# Patient Record
Sex: Male | Born: 1955
Health system: Southern US, Community
[De-identification: ages and names within clinical notes are randomized; demographics above are authoritative.]

## PROBLEM LIST (undated history)

## (undated) DIAGNOSIS — E785 Hyperlipidemia, unspecified: Secondary | ICD-10-CM

## (undated) DIAGNOSIS — M109 Gout, unspecified: Secondary | ICD-10-CM

## (undated) DIAGNOSIS — K5792 Diverticulitis of intestine, part unspecified, without perforation or abscess without bleeding: Secondary | ICD-10-CM

## (undated) DIAGNOSIS — J40 Bronchitis, not specified as acute or chronic: Secondary | ICD-10-CM

## (undated) DIAGNOSIS — T7840XA Allergy, unspecified, initial encounter: Secondary | ICD-10-CM

## (undated) DIAGNOSIS — M1990 Unspecified osteoarthritis, unspecified site: Secondary | ICD-10-CM

## (undated) DIAGNOSIS — I10 Essential (primary) hypertension: Secondary | ICD-10-CM

## (undated) DIAGNOSIS — K5790 Diverticulosis of intestine, part unspecified, without perforation or abscess without bleeding: Secondary | ICD-10-CM

## (undated) DIAGNOSIS — K635 Polyp of colon: Secondary | ICD-10-CM

## (undated) DIAGNOSIS — K529 Noninfective gastroenteritis and colitis, unspecified: Secondary | ICD-10-CM

## (undated) HISTORY — DX: Hyperlipidemia, unspecified: E78.5

## (undated) HISTORY — PX: SPINE SURGERY: SHX786

## (undated) HISTORY — PX: COLONOSCOPY: SHX174

## (undated) HISTORY — DX: Allergy, unspecified, initial encounter: T78.40XA

## (undated) HISTORY — PX: JOINT REPLACEMENT: SHX530

## (undated) HISTORY — DX: Polyp of colon: K63.5

## (undated) HISTORY — DX: Diverticulosis of intestine, part unspecified, without perforation or abscess without bleeding: K57.90

## (undated) HISTORY — PX: COLONOSCOPY W/ POLYPECTOMY: SHX1380

---

## 2005-05-09 ENCOUNTER — Ambulatory Visit: Payer: Self-pay | Admitting: Internal Medicine

## 2005-12-20 ENCOUNTER — Ambulatory Visit: Payer: Self-pay | Admitting: Internal Medicine

## 2006-01-02 ENCOUNTER — Ambulatory Visit: Payer: Self-pay | Admitting: Internal Medicine

## 2006-01-10 ENCOUNTER — Ambulatory Visit: Payer: Self-pay | Admitting: Internal Medicine

## 2007-04-29 ENCOUNTER — Ambulatory Visit: Payer: Self-pay | Admitting: Internal Medicine

## 2008-04-05 ENCOUNTER — Ambulatory Visit: Payer: Self-pay | Admitting: Internal Medicine

## 2009-03-23 ENCOUNTER — Ambulatory Visit: Payer: Self-pay | Admitting: Internal Medicine

## 2009-12-14 ENCOUNTER — Ambulatory Visit: Payer: Self-pay | Admitting: Family Medicine

## 2009-12-14 DIAGNOSIS — I10 Essential (primary) hypertension: Secondary | ICD-10-CM

## 2009-12-18 ENCOUNTER — Telehealth: Payer: Self-pay | Admitting: Internal Medicine

## 2010-01-09 ENCOUNTER — Ambulatory Visit: Payer: Self-pay | Admitting: Internal Medicine

## 2010-01-09 DIAGNOSIS — G4700 Insomnia, unspecified: Secondary | ICD-10-CM | POA: Insufficient documentation

## 2010-01-16 ENCOUNTER — Encounter (INDEPENDENT_AMBULATORY_CARE_PROVIDER_SITE_OTHER): Payer: Self-pay | Admitting: *Deleted

## 2010-03-16 ENCOUNTER — Emergency Department (HOSPITAL_COMMUNITY): Admission: EM | Admit: 2010-03-16 | Discharge: 2010-03-16 | Payer: Self-pay | Admitting: Emergency Medicine

## 2010-03-26 ENCOUNTER — Ambulatory Visit: Payer: Self-pay | Admitting: Internal Medicine

## 2010-03-26 LAB — CONVERTED CEMR LAB
ALT: 33 units/L (ref 0–53)
AST: 28 units/L (ref 0–37)
Alkaline Phosphatase: 50 units/L (ref 39–117)
BUN: 14 mg/dL (ref 6–23)
Bilirubin, Direct: 0.1 mg/dL (ref 0.0–0.3)
Calcium: 9.1 mg/dL (ref 8.4–10.5)
Cholesterol: 153 mg/dL (ref 0–200)
Creatinine, Ser: 1.1 mg/dL (ref 0.4–1.5)
Eosinophils Relative: 2.2 % (ref 0.0–5.0)
GFR calc non Af Amer: 75.11 mL/min (ref >60)
LDL Cholesterol: 93 mg/dL (ref 0–99)
Lymphocytes Relative: 33.2 % (ref 12.0–46.0)
Monocytes Absolute: 0.9 10*3/uL (ref 0.1–1.0)
Monocytes Relative: 9.7 % (ref 3.0–12.0)
Neutrophils Relative %: 54.3 % (ref 43.0–77.0)
Nitrite: NEGATIVE
PSA: 0.53 ng/mL (ref 0.10–4.00)
Platelets: 292 10*3/uL (ref 150.0–400.0)
Protein, U semiquant: NEGATIVE
RBC: 4.51 M/uL (ref 4.22–5.81)
Total Bilirubin: 0.4 mg/dL (ref 0.3–1.2)
Total CHOL/HDL Ratio: 4
Triglycerides: 108 mg/dL (ref 0.0–149.0)
Urobilinogen, UA: 0.2
VLDL: 21.6 mg/dL (ref 0.0–40.0)
WBC: 8.9 10*3/uL (ref 4.5–10.5)

## 2010-04-02 ENCOUNTER — Ambulatory Visit: Payer: Self-pay | Admitting: Internal Medicine

## 2010-04-02 ENCOUNTER — Encounter: Payer: Self-pay | Admitting: Internal Medicine

## 2010-04-03 ENCOUNTER — Encounter: Payer: Self-pay | Admitting: Internal Medicine

## 2010-04-19 ENCOUNTER — Telehealth: Payer: Self-pay | Admitting: Internal Medicine

## 2010-05-03 ENCOUNTER — Ambulatory Visit: Payer: Self-pay | Admitting: Internal Medicine

## 2010-05-03 ENCOUNTER — Encounter (INDEPENDENT_AMBULATORY_CARE_PROVIDER_SITE_OTHER): Payer: Self-pay | Admitting: *Deleted

## 2010-05-08 ENCOUNTER — Ambulatory Visit: Payer: Self-pay | Admitting: Internal Medicine

## 2010-05-11 ENCOUNTER — Ambulatory Visit: Payer: Self-pay | Admitting: Internal Medicine

## 2010-05-11 DIAGNOSIS — M109 Gout, unspecified: Secondary | ICD-10-CM | POA: Insufficient documentation

## 2010-05-22 ENCOUNTER — Ambulatory Visit: Payer: Self-pay | Admitting: Internal Medicine

## 2010-05-27 ENCOUNTER — Encounter: Payer: Self-pay | Admitting: Internal Medicine

## 2010-08-01 NOTE — Letter (Signed)
Summary: Patient Notice- Polyp Results  [facility]  [address] [place], [place] [address]   Phone: [phone]  Fax: [phone]        May 27, 2010 MRN: [id number]    KAIDIN BOEHLE [address] [employer] [employer], [place]  [address]    Dear Mr. Wilds,  I am pleased to inform you that the colon polyp(s) removed during your recent colonoscopy was (were) found to be benign (no cancer detected) upon pathologic examination.  I recommend you have a repeat colonoscopy examination in 5 years to look for recurrent polyps, as having colon polyps increases your risk for having recurrent polyps or even colon cancer in the future.  Should you develop new or worsening symptoms of abdominal pain, bowel habit changes or bleeding from the rectum or bowels, please schedule an evaluation with either your primary care physician or with me.  Additional information/recommendations:  __ No further action with gastroenterology is needed at this time. Please      follow-up with your primary care physician for your other healthcare      needs.    Please call us if you are having persistent problems or have questions about your condition that have not been fully answered at this time.  Sincerely,  Hilarie Fredrickson MD  This letter has been electronically signed by your physician.  Appended Document: Patient Notice- Polyp Results Letter mailed

## 2010-08-01 NOTE — Letter (Signed)
Summary: Referral - not able to see patient  [facility]  [address] [place], [place] [address]   Phone: [phone]  Fax: [phone]    January 16, 2010   Birdie Sons, M.D. [address] [place], [place] [address]   Re:   [name] DOB:  [birth year] MRN:   [id number]    Dear Dr. Cato Mulligan:  Thank you for your kind referral of the above patient.  We have attempted to schedule the recommended procedure Screening Colonoscopy but have not been able to schedule because:  ___ The patient was not available by phone and/or has not returned our calls.   X  The patient declined to schedule the procedure at this time.  We appreciate the referral and hope that we will have the opportunity to treat this patient in the future.    Sincerely,    [facility] [phone]

## 2010-08-01 NOTE — Letter (Signed)
Summary: Moviprep Instructions  [facility]  [address]   [place], [place] [address]   Phone: [phone]  Fax: [phone]       Donald [name]    [birth year]    MRN: [id number]        Procedure Day Dorna Bloom: Tuesday, 05-22-10     Arrival Time: 10:00 a.m.     Procedure Time: 11:00 a.m.     Location of Procedure:                    x    [facility]oscopy Center (4th Floor)                        PREPARATION FOR COLONOSCOPY WITH MOVIPREP   Starting 5 days prior to your procedure 05-17-10 do not eat nuts, seeds, popcorn, corn, beans, peas,  salads, or any raw vegetables.  Do not take any fiber supplements (e.g. Metamucil, Citrucel, and Benefiber).  THE DAY BEFORE YOUR PROCEDURE         DATE: 05-21-10   DAY: Monday  1.  Drink clear liquids the entire day-NO SOLID FOOD  2.  Do not drink anything colored red or purple.  Avoid juices with pulp.  No orange juice.  3.  Drink at least 64 oz. (8 glasses) of fluid/clear liquids during the day to prevent dehydration and help the prep work efficiently.  CLEAR LIQUIDS INCLUDE: Water Jello Ice Popsicles Tea (sugar ok, no milk/cream) Powdered fruit flavored drinks Coffee (sugar ok, no milk/cream) Gatorade Juice: apple, white grape, white cranberry  Lemonade Clear bullion, consomm, broth Carbonated beverages (any kind) Strained chicken noodle soup Hard Candy                             4.  In the morning, mix first dose of MoviPrep solution:    Empty 1 Pouch A and 1 Pouch B into the disposable container    Add lukewarm drinking water to the top line of the container. Mix to dissolve    Refrigerate (mixed solution should be used within 24 hrs)  5.  Begin drinking the prep at 5:00 p.m. The MoviPrep container is divided by 4 marks.   Every 15 minutes drink the solution down to the next mark (approximately 8 oz) until the full liter is complete.   6.  Follow completed prep with 16 oz of clear liquid of your choice  (Nothing red or purple).  Continue to drink clear liquids until bedtime.  7.  Before going to bed, mix second dose of MoviPrep solution:    Empty 1 Pouch A and 1 Pouch B into the disposable container    Add lukewarm drinking water to the top line of the container. Mix to dissolve    Refrigerate  THE DAY OF YOUR PROCEDURE      DATE: 05-22-10 DAY: Tuesday  Beginning at 6:00 a.m. (5 hours before procedure):         1. Every 15 minutes, drink the solution down to the next mark (approx 8 oz) until the full liter is complete.  2. Follow completed prep with 16 oz. of clear liquid of your choice.    3. You may drink clear liquids until  9:00 a.m.  (2 HOURS BEFORE PROCEDURE).   MEDICATION INSTRUCTIONS  Unless otherwise instructed, you should take regular prescription medications with a small sip of water   as early as possible  the morning of your procedure.   Additional medication instructions: Do not take Losartan/HCTZ day of procedure.         OTHER INSTRUCTIONS  You will need a responsible adult at least 54 years of age to accompany you and drive you home.   This person must remain in the waiting room during your procedure.  Wear loose fitting clothing that is easily removed.  Leave jewelry and other valuables at home.  However, you may wish to bring a book to read or  an iPod/MP3 player to listen to music as you wait for your procedure to start.  Remove all body piercing jewelry and leave at home.  Total time from sign-in until discharge is approximately 2-3 hours.  You should go home directly after your procedure and rest.  You can resume normal activities the  day after your procedure.  The day of your procedure you should not:   Drive   Make legal decisions   Operate machinery   Drink alcohol   Return to work  You will receive specific instructions about eating, activities and medications before you leave.    The above instructions have been reviewed  and explained to me by   Ezra Sites RN  May 08, 2010 9:58 AM    I fully understand and can verbalize these instructions _____________________________ Date _________

## 2010-08-01 NOTE — Assessment & Plan Note (Signed)
Summary: ? bronchitis//ccm   Vital Signs:  Patient profile:   55 year old male Temp:     97.8 degrees F oral BP sitting:   180 / 110  (left arm) Cuff size:   large  Vitals Entered By: Sid Falcon LPN (December 14, 2009 8:38 AM)  Serial Vital Signs/Assessments:  Time      Position  BP       Pulse  Resp  Temp     By                     186/118                        Evelena Peat MD  CC: Bronchitis symptoms, Hypertension Management   History of Present Illness: Patient is seen today for the following:  Two-week history of upper respiratory illness. Started with sore throat. Initially thought this was allergy related. Subsequent development of cough with thick white sputum. Denies dyspnea, pleuritic pain, or hemoptysis. No fever. Patient is nonsmoker. Tried NyQuil and Robitussin without relief. Cough is especially severe at night.  History of elevated blood pressure but never treated. Denies any headaches, dizziness, chest pains, or any edema issues. No regular nonsteroidal use. Has not had complete physical in several years  Hypertension History:      He denies headache, chest pain, palpitations, dyspnea with exertion, orthopnea, PND, peripheral edema, visual symptoms, neurologic problems, syncope, and side effects from treatment.        Positive major cardiovascular risk factors include male age 75 years old or older and hypertension.  Negative major cardiovascular risk factors include non-tobacco-user status.     Allergies (verified): No Known Drug Allergies  Past History:  Social History: Last updated: 12/14/2009 Never Smoked Alcohol use-yes  Past Medical History: elevated blood pressure  Social History: Never Smoked Alcohol use-yes  Review of Systems  The patient denies vision loss, chest pain, syncope, dyspnea on exertion, peripheral edema, prolonged cough, headaches, hemoptysis, abdominal pain, melena, hematochezia, and severe indigestion/heartburn.     Physical Exam  General:  Well-developed,well-nourished,in no acute distress; alert,appropriate and cooperative throughout examination Eyes:  No corneal or conjunctival inflammation noted. EOMI. Perrla. Funduscopic exam benign, without hemorrhages, exudates or papilledema. Vision grossly normal. Ears:  External ear exam shows no significant lesions or deformities.  Otoscopic examination reveals clear canals, tympanic membranes are intact bilaterally without bulging, retraction, inflammation or discharge. Hearing is grossly normal bilaterally. Mouth:  Oral mucosa and oropharynx without lesions or exudates.  Teeth in good repair. Neck:  No deformities, masses, or tenderness noted. Lungs:  Normal respiratory effort, chest expands symmetrically. Lungs are clear to auscultation, no crackles or wheezes. Heart:  Normal rate and regular rhythm. S1 and S2 normal without gallop, murmur, click, rub or other extra sounds. Extremities:  no pitting edema noted   Impression & Recommendations:  Problem # 1:  ACUTE BRONCHITIS (ICD-466.0) Assessment New suspect viral. Prescribed cough medication for symptom relief His updated medication list for this problem includes:    Vicks Nyquil Multi-symptom 15-6.25-500 Mg/41ml Liqd (Dm-doxylamine-acetaminophen) .Marland Kitchen... Prn    Hydrocodone-homatropine 5-1.5 Mg/91ml Syrp (Hydrocodone-homatropine) ..... One tsp by mouth q 4-6 hours as needed cough  Problem # 2:  ESSENTIAL HYPERTENSION (ICD-401.9) Assessment: Deteriorated  severe elevation today. Start medication and follow up with primary physician in 2 weeks.  Reduce ETOH to no more than 2 drinks/day.  His updated medication list for this problem includes:  Losartan Potassium-hctz 50-12.5 Mg Tabs (Losartan potassium-hctz) ..... One by mouth once daily  Complete Medication List: 1)  Vicks Nyquil Multi-symptom 15-6.25-500 Mg/22ml Liqd (Dm-doxylamine-acetaminophen) .... Prn 2)  Aspirin 325 Mg Tabs (Aspirin) ....  Prn 3)  Epipen 0.3 Mg/0.11ml Devi (Epinephrine) .... Use as directed for severe allergic reaction 4)  Hydrocodone-homatropine 5-1.5 Mg/39ml Syrp (Hydrocodone-homatropine) .... One tsp by mouth q 4-6 hours as needed cough 5)  Losartan Potassium-hctz 50-12.5 Mg Tabs (Losartan potassium-hctz) .... One by mouth once daily  Hypertension Assessment/Plan:      The patient's hypertensive risk group is category B: At least one risk factor (excluding diabetes) with no target organ damage.  Today's blood pressure is 180/110.    Patient Instructions: 1)  Please schedule a follow-up appointment in 2 weeks with Dr Cato Mulligan. 2)  You need to lose weight. Consider a lower calorie diet and regular exercise.  3)  It is not healthy for men to drink more then 2-3 drinks per day or for women to drink more then 1-2 drinks per day.  4)  Acute Bronchitis symptoms for less then 10 days are not  helped by antibiotics. Take over the counter cough medications. Call if no improvement in 5-7 days, sooner if increasing cough, fever, or new symptoms ( shortness of breath, chest pain) .  Prescriptions: LOSARTAN POTASSIUM-HCTZ 50-12.5 MG TABS (LOSARTAN POTASSIUM-HCTZ) one by mouth once daily  #30 x 3   Entered and Authorized by:   Evelena Peat MD   Signed by:   Evelena Peat MD on 12/14/2009   Method used:   Electronically to        [id number] (ret[facility]       [address]       [place], [place]  [address]       Ph: [phone] or [phone]       Fax: [phone]   RxID:   [id number] HYDROCODONE-HOMATROPINE 5-1.5 MG/5ML SYRP (HYDROCODONE-HOMATROPINE) one tsp by mouth q 4-6 hours as needed cough  #120 ml x 0   Entered and Authorized by:   Evelena Peat MD   Signed by:   Evelena Peat MD on 12/14/2009   Method used:   Print then Give to Patient   RxID:   [id number]

## 2010-08-01 NOTE — Progress Notes (Signed)
  Phone Note Call from Patient   Caller: Patient Call For: Birdie Sons MD Summary of Call: Left message for pt to reschedule his mole excision. Initial call taken by: [facility] CMA AAMA,  April 19, 2010 1:14 PM

## 2010-08-01 NOTE — Assessment & Plan Note (Signed)
Summary: ?strep throat/cjr   Vital Signs:  Patient profile:   55 year old male Temp:     98.9 degrees F oral BP sitting:   142 / 94  (left arm) Cuff size:   large  Vitals Entered By: Alfred Levins, CMA (May 03, 2010 8:06 AM)  Contraindications/Deferment of Procedures/Staging:    Test/Procedure: Weight Refused    Reason for deferment: patient declined-cannot calculate BMI  CC: st x3 days   CC:  st x3 days.  History of Present Illness: patient comes in complaining of sore throat for 4 days. Denies any fever chills. In addition he notes developing an abscess on his right deltoid area coincidently in the same place 30th tetanus shot 4 weeks ago.  Patient describes throat pain as moderate to severe. He has a history of strep throat.  Review of systems patient denies any chest pain or shortness breath, PND. Denies any fever, chills, rashes. No other complaints.  Allergies (verified): No Known Drug Allergies  Past History:  Past Medical History: Last updated: 12/14/2009 elevated blood pressure  Family History: Last updated: Apr 04, 2010 father deceased lung CA age 50 mother deceased COPD 6  Social History: Last updated: 2010/04/04 Never Smoked Alcohol use-yes [employer] no kids  Risk Factors: Smoking Status: never (03/23/2009)  Physical Exam  General:  overweight male in no acute distress. HEENT exam atraumatic, normocephalic symmetric her muscles are intact. Oropharyngeal is moist. Posterior pharynx is erythematous with minimal exudate. Neck is supple without lymphadenopathy. Chest heart auscultation cardiac exam S1-S2 are regular. Neurologic exam of the right deltoid area patient has a 2 x 3 cm fluctuant area.   Impression & Recommendations:  Problem # 1:  CELLULITIS/ABSCESS, ARM (ICD-682.3)  patient has developed a cellulitis and abscess on his right arm. Unclear whether related to tetanus shot. Timing doesn't seem consistent. After risks and  benefits were explained the patient he agreed to I&D the area. There was prepped and draped in a sterile fashion. Access was entered with a 20-gauge needle. Fairly large incision was made this with a needle. Several cc of fluctuant material was expressed. Patient was instructed to keep the area clean. He'll use warm compress. His updated medication list for this problem includes:    Doxycycline Hyclate 100 Mg Caps (Doxycycline hyclate) .Marland Kitchen... Take 1 tab twice a day  side effects discussed.  Orders: I&D Abscess, Simple / Single (10060)  Problem # 2:  SORE THROAT (ICD-462) patient has an erythematous throat. Strep screen was negative. It turns out that he needs an antibiotic for his arm and this should cover any possibility of strep throat. His updated medication list for this problem includes:    Aspirin 325 Mg Tabs (Aspirin) .Marland Kitchen... Prn    Doxycycline Hyclate 100 Mg Caps (Doxycycline hyclate) .Marland Kitchen... Take 1 tab twice a day  Orders: Rapid Strep ([address])  Complete Medication List: 1)  Aspirin 325 Mg Tabs (Aspirin) .... Prn 2)  Epipen 0.3 Mg/0.63ml Devi (Epinephrine) .... Use as directed for severe allergic reaction 3)  Losartan Potassium-hctz 50-12.5 Mg Tabs (Losartan potassium-hctz) .... One by mouth once daily 4)  Trazodone Hcl 50 Mg Tabs (Trazodone hcl) .... 1/2-1 by mouth at bedtime as needed insomnia 5)  Doxycycline Hyclate 100 Mg Caps (Doxycycline hyclate) .... Take 1 tab twice a day Prescriptions: EPIPEN 0.3 MG/0.3ML DEVI (EPINEPHRINE) use as directed for severe allergic reaction  #2 x 1   Entered and Authorized by:   Birdie Sons MD   Signed by:  Birdie Sons MD on 05/03/2010   Method used:   Electronically to        [id number]       [address]       [place], [place]  [address]       Ph: [phone] or [phone]       Fax: [phone]   RxID:   [id number] DOXYCYCLINE HYCLATE 100 MG CAPS (DOXYCYCLINE HYCLATE) Take 1 tab twice a day  #20 x 0   Entered and  Authorized by:   Birdie Sons MD   Signed by:   Birdie Sons MD on 05/03/2010   Method used:   Electronically to        [id number]       [address]       [place], [place]  [address]       Ph: [phone] or [phone]       Fax: [phone]   RxID:   [id number]    Orders Added: 1)  Rapid Strep [254[address]] 2)  Est. Patient Level III [[address]] 3)  I&D Abscess, Simple / Single [10060]     Laboratory Results  Date/Time Received: May 03, 2010 8:23 AM Date/Time Reported: May 03, 2010 8:23 AM  Other Tests  Rapid HIV: negative Comments: Alfred Levins, CMA  May 03, 2010 8:23 AM

## 2010-08-01 NOTE — Letter (Signed)
Summary: Pre Visit Letter Revised  [facility]  [address] [place], [place] [address]   Phone: [phone]  Fax: [phone]        04/03/2010 MRN: [id number]  Donald [name] [address] [place], [place]  [address]             Procedure Date:  11-22 at 11am  Welcome to the Gastroenterology Division at [facility].    You are scheduled to see a nurse for your pre-procedure visit on 05-08-10 at 10am  on the 3rd floor at [facility], [address]  We ask that you try to arrive at our office 15 minutes prior to your appointment time to allow for check-in.  Please take a minute to review the attached form.  If you answer "Yes" to one or more of the questions on the first page, we ask that you call the person listed at your earliest opportunity.  If you answer "No" to all of the questions, please complete the rest of the form and bring it to your appointment.    Your nurse visit will consist of discussing your medical and surgical history, your immediate family medical history, and your medications.   If you are unable to list all of your medications on the form, please bring the medication bottles to your appointment and we will list them.  We will need to be aware of both prescribed and over the counter drugs.  We will need to know exact dosage information as well.    Please be prepared to read and sign documents such as consent forms, a financial agreement, and acknowledgement forms.  If necessary, and with your consent, a friend or relative is welcome to sit-in on the nurse visit with you.  Please bring your insurance card so that we may make a copy of it.  If your insurance requires a referral to see a specialist, please bring your referral form from your primary care physician.  No co-pay is required for this nurse visit.     If you cannot keep your appointment, please call [phone] to cancel or reschedule prior to your appointment date.  This  allows [place] the opportunity to schedule an appointment for another patient in need of care.    Thank you for choosing [facility] for your medical needs.  We appreciate the opportunity to care for you.  Please visit [place] at our website  to learn more about our practice.  Sincerely, The Gastroenterology Division

## 2010-08-01 NOTE — Miscellaneous (Signed)
Summary: [facility]  Clinical Lists Changes  Medications: Added new medication of MOVIPREP 100 GM  SOLR (PEG-KCL-NACL-NASULF-NA ASC-C) As per prep instructions. - Signed Rx of MOVIPREP 100 GM  SOLR (PEG-KCL-NACL-NASULF-NA ASC-C) As per prep instructions.;  #1 x 0;  Signed;  Entered by: Ezra Sites RN;  Authorized by: Hilarie Fredrickson MD;  Method used: Electronically [id number] [address] [place], [place]  [address], Ph: [phone] or [phone], Fax: [phone] Observations: Added new observation of NKA: T (05/08/2010 9:32)    Prescriptions: MOVIPREP 100 GM  SOLR (PEG-KCL-NACL-NASULF-NA ASC-C) As per prep instructions.  #1 x 0   Entered by:   Ezra Sites RN   Authorized by:   Hilarie Fredrickson MD   Signed by:   Ezra Sites RN on 05/08/2010   Method used:   Electronically to        [id number] ([facility]       [address]       [place], [place]  [address]       Ph: [phone] or [phone]       Fax: [phone]   RxID:   [id number]

## 2010-08-01 NOTE — Progress Notes (Signed)
Summary: continues cough  Phone Note Refill Request Call back at [phone] Message from:  Patient on December 18, 2009 8:23 AM  Refills Requested: Medication #1:  HYDROCODONE-HOMATROPINE 5-1.5 MG/5ML SYRP one tsp by mouth q 4-6 hours as needed cough   Notes: [facility].    Initial call taken by: Debbra Riding,  December 18, 2009 8:23 AM  Follow-up for Phone Call        was just prescribed on 12/14/09.  do you want to refill already?   Called pt and he states will be out today.  States cough is some better but not resolved. Follow-up by: Gladis Riffle, RN,  December 18, 2009 12:07 PM  Additional Follow-up for Phone Call Additional follow up Details #1::        ok to refill Additional Follow-up by: Birdie Sons MD,  December 18, 2009 4:44 PM    Additional Follow-up for Phone Call Additional follow up Details #2::    see Rx.Patient notified.  Follow-up by: Gladis Riffle, RN,  December 19, 2009 7:36 AM  Prescriptions: Romana Juniper 5-1.5 MG/5ML SYRP (HYDROCODONE-HOMATROPINE) one tsp by mouth q 4-6 hours as needed cough  #120 ml x 0   Entered by:   Gladis Riffle, RN   Authorized by:   Birdie Sons MD   Signed by:   Gladis Riffle, RN on 12/19/2009   Method used:   Telephoned to ...       [id number]       [address]       [place], [place]  [address]       Ph: [phone] or [phone]       Fax: [phone]   RxID:   [id number]

## 2010-08-01 NOTE — Assessment & Plan Note (Signed)
Summary: CPX/NJR   Vital Signs:  Patient profile:   55 year old male Height:      74.50 inches Weight:      335 pounds BMI:     42.59 Temp:     98.7 degrees F oral Pulse rate:   80 / minute Pulse rhythm:   regular Resp:     12 per minute BP sitting:   130 / 92  (left arm) Cuff size:   large  Vitals Entered By: Sid Falcon LPN (April 02, 2010 9:12 AM)  History of Present Illness: CPX   Current Problems (verified): 1)  Preventive Health Care  (ICD-V70.0) 2)  Insomnia-sleep Disorder-unspec  (ICD-780.52) 3)  Essential Hypertension  (ICD-401.9) 4)  Uri  (ICD-465.9)  Current Medications (verified): 1)  Aspirin 325 Mg Tabs (Aspirin) .... Prn 2)  Epipen 0.3 Mg/0.41ml Devi (Epinephrine) .... Use As Directed For Severe Allergic Reaction 3)  Losartan Potassium-Hctz 50-12.5 Mg Tabs (Losartan Potassium-Hctz) .... One By Mouth Once Daily 4)  Trazodone Hcl 50 Mg Tabs (Trazodone Hcl) .... 1/2-1 By Mouth At Bedtime As Needed Insomnia  Allergies (verified): No Known Drug Allergies  Family History: father deceased lung CA age 16 mother deceased COPD 16  Social History: Never Smoked Alcohol use-yes [employer] no kids  Physical Exam  General:  alert and well-developed.   Head:  normocephalic and atraumatic.   Eyes:  pupils equal and pupils round.   Ears:  R ear normal and L ear normal.   Nose:  no external deformity and no external erythema.   Mouth:  no gingival abnormalities and no dental plaque.   Neck:  No deformities, masses, or tenderness noted. Chest Wall:  No deformities, masses, tenderness or gynecomastia noted. Lungs:  normal respiratory effort and no intercostal retractions.   Heart:  normal rate and regular rhythm.   Abdomen:  soft and non-tender.   Msk:  No deformity or scoliosis noted of thoracic or lumbar spine.   Neurologic:  cranial nerves II-XII intact and gait normal.   Skin:  turgor normal and color normal.   nearly 1cm lesioin right upper  chest wall---dark Cervical Nodes:  no anterior cervical adenopathy and no posterior cervical adenopathy.   Psych:  good eye contact and not anxious appearing.     Impression & Recommendations:  Problem # 1:  PREVENTIVE HEALTH CARE (ICD-V70.0)  health maint UTD advised aggressive weight loss daily exercise  immunizations updated  Orders: Gastroenterology Referral (GI)  Problem # 2:  ESSENTIAL HYPERTENSION (ICD-401.9) reasonable control given weight BP today: 130/92 Prior BP: 128/92 (01/09/2010)  Prior 10 Yr Risk Heart Disease: Not enough information (12/14/2009)  Labs Reviewed: K+: 4.6 (03/26/2010) Creat: : 1.1 (03/26/2010)   Chol: 153 (03/26/2010)   HDL: 38.40 (03/26/2010)   LDL: 93 (03/26/2010)   TG: 108.0 (03/26/2010) schedule mole excision  Complete Medication List: 1)  Aspirin 325 Mg Tabs (Aspirin) .... Prn 2)  Epipen 0.3 Mg/0.24ml Devi (Epinephrine) .... Use as directed for severe allergic reaction 3)  Losartan Potassium-hctz 50-12.5 Mg Tabs (Losartan potassium-hctz) .... One by mouth once daily 4)  Trazodone Hcl 50 Mg Tabs (Trazodone hcl) .... 1/2-1 by mouth at bedtime as needed insomnia  Other Orders: Admin 1st Vaccine (30865) Flu Vaccine 14yrs + (78469)   Patient Instructions: 1)  schedule mole excision   Flu Vaccine Consent Questions     Do you have a history of severe allergic reactions to this vaccine? no    Any prior  history of allergic reactions to egg and/or gelatin? no    Do you have a sensitivity to the preservative Thimersol? no    Do you have a past history of Guillan-Barre Syndrome? no    Do you currently have an acute febrile illness? no    Have you ever had a severe reaction to latex? no    Vaccine information given and explained to patient? yes    Are you currently pregnant? no    Lot Number:[id number]   Exp Date:12/29/2010   Site Given  Left Deltoid IMbflu   Appended Document: CPX/NJR       Immunizations Administered:  Tetanus  Vaccine:    Vaccine Type: Tdap    Site: right deltoid    Mfr: GlaxoSmithKline    Dose: 0.5 ml    Route: IM    Given by: Sid Falcon LPN    Exp. Date: 04/19/2012    Lot #: [id number]    VIS given: 05/18/08 version given April 02, 2010.

## 2010-08-01 NOTE — Procedures (Signed)
Summary: Colonoscopy  Patient: [name] Note: All result statuses are Final unless otherwise noted.  Tests: (1) Colonoscopy (COL)   COL Colonoscopy           DONE     [facility]     [address]     [place], [place]  [address]           COLONOSCOPY PROCEDURE REPORT           PATIENT:  Donald, [name]  MR#:  [id number]     BIRTHDATE:  03-Oct-1955, 54 yrs. old  GENDER:  male     ENDOSCOPIST:  Wilhemina Bonito. Eda Keys, MD     REF. BY:  Birdie Sons, M.D.     PROCEDURE DATE:  05/22/2010     PROCEDURE:  Colonoscopy with snare polypectomy x3     ASA CLASS:  Class II     INDICATIONS:  Routine Risk Screening     MEDICATIONS:   Fentanyl 125 mcg IV, Versed 12 mg IV, Benadryl 50     mg IV           DESCRIPTION OF PROCEDURE:   After the risks benefits and     alternatives of the procedure were thoroughly explained, informed     consent was obtained.  Digital rectal exam was performed and     revealed no abnormalities.   The LB 180AL [id number] endoscope was     introduced through the anus and advanced to the cecum, which was     identified by both the appendix and ileocecal valve, without     limitations.Time to the cecum = 14:00 min.  The quality of the     prep was excellent, using MoviPrep.  The instrument was then     slowly withdrawn (time = 14:14 min) as the colon was fully     examined.     <<PROCEDUREIMAGES>>           FINDINGS:  Three polyps were found - 4mm hp appearring transverse     colon polyp and 6mm and 7mm pedunculated sigmoid Polyps. These     were snared without cautery. Retrieval was successful.  Severe     diverticulosis was found in the left colon.   Retroflexed views in     the rectum revealed no abnormalities.    The scope was then     withdrawn from the patient and the procedure completed.           COMPLICATIONS:  None     ENDOSCOPIC IMPRESSION:     1) Three polyps- removed     2) Severe diverticulosis in the left colon     RECOMMENDATIONS:     1)  Follow up colonoscopy in 3 years if all adenomas,  5 years if     < 3 adenomas; 10 years if no adenomas           ______________________________     Wilhemina Bonito. Eda Keys, MD           CC:  Donald Magnus, MD;The Patient           n.     Donald DoctorWilhemina Bonito. Eda Keys at 05/22/2010 12:37 PM           [name], [phone]  Note: An exclamation mark (!) indicates a result that was not dispersed into the flowsheet. Document Creation Date: 05/22/2010 12:37 PM _______________________________________________________________________  (1) Order result status: Final Collection  or observation date-time: 05/22/2010 12:24 Requested date-time:  Receipt date-time:  Reported date-time:  Referring Physician:   Ordering Physician: Fransico Setters [phone]) Specimen Source:  Source: Launa Grill Order Number: [id number] Lab site:   Appended Document: Colonoscopy recall 5 yrs     Procedures Next Due Date:    Colonoscopy: 05/2015

## 2010-08-01 NOTE — Assessment & Plan Note (Signed)
Summary: 1 month rov/njr   Vital Signs:  Patient profile:   55 year old male Height:      75 inches (190.50 cm) Temp:     98.4 degrees F (36.89 degrees C) oral Pulse rate:   80 / minute BP sitting:   128 / 92  (left arm) Cuff size:   large  Vitals Entered By: Josph Macho RMA (January 09, 2010 10:55 AM) CC: 1 month follow up/ pt didn't want to be weighed/ pt wants BG checked/ CF Is Patient Diabetic? No   CC:  1 month follow up/ pt didn't want to be weighed/ pt wants BG checked/ CF.  History of Present Illness:  Follow-Up Visit      This is a 56 year old man who presents for Follow-up visit.  The patient denies chest pain and palpitations.  Since the last visit the patient notes no new problems or concerns.  The patient reports taking meds as prescribed and not monitoring BP.  When questioned about possible medication side effects, the patient notes none.   reviewed dr burchette's note.   in addition he talks about difficulty sleeping: he works 3rd shift  All other systems reviewed and were negative   Current Problems (verified): 1)  Preventive Health Care  (ICD-V70.0) 2)  Insomnia-sleep Disorder-unspec  (ICD-780.52) 3)  Essential Hypertension  (ICD-401.9) 4)  Uri  (ICD-465.9)  Current Medications (verified): 1)  Aspirin 325 Mg Tabs (Aspirin) .... Prn 2)  Epipen 0.3 Mg/0.34ml Devi (Epinephrine) .... Use As Directed For Severe Allergic Reaction 3)  Losartan Potassium-Hctz 50-12.5 Mg Tabs (Losartan Potassium-Hctz) .... One By Mouth Once Daily  Allergies (verified): No Known Drug Allergies  Past History:  Past Medical History: Last updated: 12/14/2009 elevated blood pressure  Social History: Last updated: 12/14/2009 Never Smoked Alcohol use-yes  Physical Exam  General:  Well-developed,well-nourished,in no acute distress; alert,appropriate and cooperative throughout examination Head:  normocephalic and atraumatic.   Eyes:  pupils equal and pupils round.   Neck:   No deformities, masses, or tenderness noted. Lungs:  normal respiratory effort and no intercostal retractions.   Heart:  normal rate and regular rhythm.   Skin:  turgor normal and color normal.     Impression & Recommendations:  Problem # 1:  ESSENTIAL HYPERTENSION (ICD-401.9) Assessment Improved fair control continue current medications  see me 3 months monitor BP at home---eventual goal of < 135/85 His updated medication list for this problem includes:    Losartan Potassium-hctz 50-12.5 Mg Tabs (Losartan potassium-hctz) ..... One by mouth once daily  BP today: 128/92 Prior BP: 180/110 (12/14/2009)  Prior 10 Yr Risk Heart Disease: Not enough information (12/14/2009)  Problem # 2:  INSOMNIA-SLEEP DISORDER-UNSPEC (ICD-780.52) likely related to shift work trial trazodone  Complete Medication List: 1)  Aspirin 325 Mg Tabs (Aspirin) .... Prn 2)  Epipen 0.3 Mg/0.69ml Devi (Epinephrine) .... Use as directed for severe allergic reaction 3)  Losartan Potassium-hctz 50-12.5 Mg Tabs (Losartan potassium-hctz) .... One by mouth once daily 4)  Trazodone Hcl 50 Mg Tabs (Trazodone hcl) .... 1/2-1 by mouth at bedtime as needed insomnia  Other Orders: Gastroenterology Referral (GI)  Patient Instructions: 1)  Please schedule a follow-up appointment in 3 months. Prescriptions: LOSARTAN POTASSIUM-HCTZ 50-12.5 MG TABS (LOSARTAN POTASSIUM-HCTZ) one by mouth once daily  #30 x 6   Entered and Authorized by:   Birdie Sons MD   Signed by:   Birdie Sons MD on 01/09/2010   Method used:   Electronically to  [id number] (retail)       [address]       [place], [place]  [address]       Ph: [phone] or [phone]       Fax: [phone]   RxID:   [id number] TRAZODONE HCL 50 MG TABS (TRAZODONE HCL) 1/2-1 by mouth at bedtime as needed insomnia  #30 x 6   Entered and Authorized by:   Birdie Sons MD   Signed by:   Birdie Sons MD on 01/09/2010   Method used:   Electronically to         [id number] (retail)       [address]       [place], [place]  [address]       Ph: [phone] or [phone]       Fax: [phone]   RxID:   [id number]   Laboratory Results   Blood Tests     CBG Fasting:: 95mg /dL

## 2010-08-01 NOTE — Assessment & Plan Note (Signed)
Summary: foot pain/?gout/hurts to walk/cjr   Vital Signs:  Patient profile:   56 year old male Temp:     99.0 degrees F oral Pulse rate:   100 / minute BP sitting:   150 / 90  (left arm) Cuff size:   large  Vitals Entered By: Alfred Levins, CMA (May 11, 2010 11:15 AM) CC: gout rt foot Comments pt could not step on scale   CC:  gout rt foot.  History of Present Illness: acute onset pain right great MTP joint. There is painful, swollen. Increased pain with walking. Pain is rated as a 8/10. Patient denies any fevers or chills or any other associated symptoms. No trauma.  No fevers, chills, shortness breath, chest pain or any other significant complaints in a review of systems.  Current Medications (verified): 1)  Aspirin 325 Mg Tabs (Aspirin) .... Prn 2)  Epipen 0.3 Mg/0.49ml Devi (Epinephrine) .... Use As Directed For Severe Allergic Reaction 3)  Losartan Potassium-Hctz 50-12.5 Mg Tabs (Losartan Potassium-Hctz) .... One By Mouth Once Daily 4)  Trazodone Hcl 50 Mg Tabs (Trazodone Hcl) .... 1/2-1 By Mouth At Bedtime As Needed Insomnia 5)  Doxycycline Hyclate 100 Mg Caps (Doxycycline Hyclate) .... Take 1 Tab Twice A Day 6)  Moviprep 100 Gm  Solr (Peg-Kcl-Nacl-Nasulf-Na Asc-C) .... As Per Prep Instructions.  Allergies (verified): No Known Drug Allergies  Past History:  Past Medical History: Last updated: 12/14/2009 elevated blood pressure  Family History: Last updated: 2010-04-24 father deceased lung CA age 49 mother deceased COPD 26  Social History: Last updated: April 24, 2010 Never Smoked Alcohol use-yes [employer] no kids  Risk Factors: Smoking Status: never (03/23/2009)  Physical Exam  General:  well-developed well-nourished male in no acute distress. HEENT exam atraumatic, normocephalic, chest clear to auscultation extremities no clubbing cyanosis or edema. He does have some swelling around the right first MTP joint. There is some erythema and  warmth to the area.   Impression & Recommendations:  Problem # 1:  GOUT, UNSPECIFIED (ICD-274.9) discussed at length. Discussed ways to avoid gout. Discussed treatment options. See medications. Side effects discussed.  Complete Medication List: 1)  Aspirin 325 Mg Tabs (Aspirin) .... Prn 2)  Epipen 0.3 Mg/0.14ml Devi (Epinephrine) .... Use as directed for severe allergic reaction 3)  Trazodone Hcl 50 Mg Tabs (Trazodone hcl) .... 1/2-1 by mouth at bedtime as needed insomnia 4)  Moviprep 100 Gm Solr (Peg-kcl-nacl-nasulf-na asc-c) .... As per prep instructions. 5)  Losartan Potassium 100 Mg Tabs (Losartan potassium) .... Take 1 tablet by mouth once a day 6)  Diclofenac Sodium 75 Mg Tbec (Diclofenac sodium) .... Take 1 tablet by mouth two times a day with food as needed gout 7)  Hydrocodone-acetaminophen 10-325 Mg Tabs (Hydrocodone-acetaminophen) .Marland Kitchen.. 1 by mouth up to 4 time per day as needed for pain  Patient Instructions: 1)  . Prescriptions: HYDROCODONE-ACETAMINOPHEN 10-325 MG TABS (HYDROCODONE-ACETAMINOPHEN) 1 by mouth up to 4 time per day as needed for pain  #30 x 0   Entered and Authorized by:   Birdie Sons MD   Signed by:   Birdie Sons MD on 05/11/2010   Method used:   Print then Give to Patient   RxID:   [id number] DICLOFENAC SODIUM 75 MG TBEC (DICLOFENAC SODIUM) Take 1 tablet by mouth two times a day with food as needed gout  #30 x 1   Entered and Authorized by:   Birdie Sons MD   Signed by:   Birdie Sons MD on 05/11/2010  Method used:   Electronically to        [id number] (retail)       [address]       [place], [place]  [address]       Ph: [phone] or [phone]       Fax: [phone]   RxID:   [id number] UVOZDGUY POTASSIUM 100 MG TABS (LOSARTAN POTASSIUM) Take 1 tablet by mouth once a day  #30 x 11   Entered and Authorized by:   Birdie Sons MD   Signed by:   Birdie Sons MD on 05/11/2010   Method used:   Electronically to        [id number] (retail)       [address]       [place], [place]  [address]       Ph: [phone] or [phone]       Fax: [phone]   RxID:   [id number]    Orders Added: 1)  Est. Patient Level III [427[address]]

## 2010-08-04 ENCOUNTER — Other Ambulatory Visit: Payer: Self-pay | Admitting: Internal Medicine

## 2010-08-04 DIAGNOSIS — G4700 Insomnia, unspecified: Secondary | ICD-10-CM

## 2010-09-13 LAB — CBC
HCT: 39.3 % (ref 39.0–52.0)
Hemoglobin: 13.8 g/dL (ref 13.0–17.0)
MCH: 31.6 pg (ref 26.0–34.0)
RBC: 4.38 MIL/uL (ref 4.22–5.81)

## 2010-09-13 LAB — DIFFERENTIAL
Basophils Absolute: 0 10*3/uL (ref 0.0–0.1)
Eosinophils Absolute: 0.2 10*3/uL (ref 0.0–0.7)
Eosinophils Relative: 2 % (ref 0–5)

## 2010-09-13 LAB — COMPREHENSIVE METABOLIC PANEL
ALT: 29 U/L (ref 0–53)
AST: 25 U/L (ref 0–37)
CO2: 28 mEq/L (ref 19–32)
Chloride: 104 mEq/L (ref 96–112)
Creatinine, Ser: 1.07 mg/dL (ref 0.4–1.5)
GFR calc Af Amer: >60 mL/min (ref >60)
GFR calc non Af Amer: >60 mL/min (ref >60)
Total Bilirubin: 0.5 mg/dL (ref 0.3–1.2)

## 2010-09-13 LAB — LIPASE, BLOOD: Lipase: 32 U/L (ref 11–59)

## 2011-01-30 ENCOUNTER — Other Ambulatory Visit: Payer: Self-pay | Admitting: Internal Medicine

## 2011-04-05 ENCOUNTER — Telehealth: Payer: Self-pay | Admitting: *Deleted

## 2011-04-05 ENCOUNTER — Ambulatory Visit (INDEPENDENT_AMBULATORY_CARE_PROVIDER_SITE_OTHER): Payer: BC Managed Care – PPO

## 2011-04-05 DIAGNOSIS — Z23 Encounter for immunization: Secondary | ICD-10-CM

## 2011-04-05 NOTE — Telephone Encounter (Signed)
Pt needs office visit

## 2011-04-05 NOTE — Telephone Encounter (Signed)
patient  Is requesting a refill of potassium is this okay to fill?

## 2011-04-08 NOTE — Telephone Encounter (Signed)
Left detailed message for pt 

## 2011-04-29 ENCOUNTER — Other Ambulatory Visit: Payer: Self-pay | Admitting: Internal Medicine

## 2011-06-12 ENCOUNTER — Other Ambulatory Visit: Payer: Self-pay | Admitting: Internal Medicine

## 2011-07-19 ENCOUNTER — Other Ambulatory Visit: Payer: Self-pay | Admitting: Internal Medicine

## 2011-07-19 NOTE — Telephone Encounter (Signed)
See padonda 

## 2011-07-19 NOTE — Telephone Encounter (Signed)
Pt had not been seen since 2011. Pt was denied refill on bp med. Pt is requesting ov on 07-25-11 or 07-26-11. Can I sda slot?

## 2011-07-22 NOTE — Telephone Encounter (Signed)
lmom for pt to call back

## 2011-07-23 NOTE — Telephone Encounter (Signed)
Pt is sch on 07-25-2011 with NP

## 2011-07-25 ENCOUNTER — Telehealth: Payer: Self-pay | Admitting: *Deleted

## 2011-07-25 ENCOUNTER — Ambulatory Visit (INDEPENDENT_AMBULATORY_CARE_PROVIDER_SITE_OTHER): Payer: BC Managed Care – PPO | Admitting: Family

## 2011-07-25 ENCOUNTER — Encounter: Payer: Self-pay | Admitting: Family

## 2011-07-25 VITALS — BP 146/90 | HR 85 | Temp 98.2°F | Resp 16

## 2011-07-25 DIAGNOSIS — M109 Gout, unspecified: Secondary | ICD-10-CM

## 2011-07-25 DIAGNOSIS — I10 Essential (primary) hypertension: Secondary | ICD-10-CM

## 2011-07-25 MED ORDER — LOSARTAN POTASSIUM 100 MG PO TABS: 100.0000 mg | ORAL_TABLET | Freq: Every day | ORAL | Status: DC

## 2011-07-25 NOTE — Patient Instructions (Signed)

## 2011-07-25 NOTE — Progress Notes (Signed)
  Subjective:    Patient ID: Donald [name], male    DOB: Apr 27, 1956, 56 y.o.   MRN: [id number]  HPI 56 year old white male, a nonsmoker, patient of Dr. Cato Mulligan is in today for recheck of his blood pressure. He has been off his blood pressure medicine for several days. Blood pressure typically runs in the 140s over 80s. He is not routinely exercise and we'll watch any particular diet. He denies any lightheadedness, dizziness, chest pain, palpitations, shortness of breath, or edema.  Patient also has a history of gout that has been stable.   Review of Systems  Constitutional: Negative.   HENT: Negative.   Respiratory: Negative.   Cardiovascular: Negative.   Gastrointestinal: Negative.   Musculoskeletal: Negative.   Skin: Negative.   Neurological: Negative.   Hematological: Negative.   Psychiatric/Behavioral: Negative.    No past medical history on file.  History   Social History  . Marital Status: Single    Spouse Name: N/A    Number of Children: N/A  . Years of Education: N/A   Occupational History  . Not on file.   Social History Main Topics  . Smoking status: Never Smoker   . Smokeless tobacco: Not on file  . Alcohol Use: Not on file  . Drug Use: Not on file  . Sexually Active: Not on file   Other Topics Concern  . Not on file   Social History Narrative  . No narrative on file    No past surgical history on file.  No family history on file.  No Known Allergies  Current Outpatient Prescriptions on File Prior to Visit  Medication Sig Dispense Refill  . traZODone (DESYREL) 50 MG tablet TAKE 1/2 TO 1 TABLET AT BEDTIME AS NEEDED FOR INSOMNIA  30 tablet  0    BP 146/90  Pulse 85  Temp(Src) 98.2 F (36.8 C) (Oral)  Resp 16  SpO2 97%chart    Objective:   Physical Exam  Constitutional: He is oriented to person, place, and time. He appears well-developed and well-nourished.  HENT:  Right Ear: External ear normal.  Nose: Nose normal.  Mouth/Throat:  Oropharynx is clear and moist.  Neck: Normal range of motion. Neck supple.  Cardiovascular: Normal rate, regular rhythm and normal heart sounds.   Pulmonary/Chest: Effort normal and breath sounds normal.  Abdominal: Soft. Bowel sounds are normal.  Musculoskeletal: Normal range of motion.  Neurological: He is alert and oriented to person, place, and time.  Skin: Skin is warm and dry.  Psychiatric: He has a normal mood and affect.          Assessment & Plan:  Assessment: Hypertension, Gout  Plan: Refilled RX for Cozaar. Patient will return for CPX in the next month. Fasting labs drawn today. Patient's blood. pressure slightly elevated today, he will keep a blood pressure diary and recheck at CPX. He has also been off his meds for several days. Weight reduction encouraged.

## 2011-07-25 NOTE — Telephone Encounter (Signed)
Pt needed Losartan sent to pharmacy.

## 2011-12-25 ENCOUNTER — Encounter: Payer: Self-pay | Admitting: Family

## 2011-12-25 ENCOUNTER — Ambulatory Visit (INDEPENDENT_AMBULATORY_CARE_PROVIDER_SITE_OTHER): Payer: BC Managed Care – PPO | Admitting: Family

## 2011-12-25 VITALS — BP 128/90 | Temp 98.8°F | Wt 325.0 lb

## 2011-12-25 DIAGNOSIS — R05 Cough: Secondary | ICD-10-CM

## 2011-12-25 DIAGNOSIS — J209 Acute bronchitis, unspecified: Secondary | ICD-10-CM

## 2011-12-25 MED ORDER — PREDNISONE 20 MG PO TABS: ORAL_TABLET | ORAL | Status: AC

## 2011-12-25 NOTE — Patient Instructions (Signed)

## 2011-12-25 NOTE — Progress Notes (Signed)
  Subjective:    Patient ID: Donald [name], male    DOB: 1955/10/07, 56 y.o.   MRN: [id number]  HPI 56 year old white male, nonsmoker, patient of Dr. Cato Mulligan is in today with complaints of cough and wheezing x3 days. Significant over-the-counter Claritin with no relief. He has a history of bronchitis in the past and has responded well to prednisone. Denies any fever, muscle aches or pain, chest pain, palpitations, shortness of breath or edema.   Review of Systems  Constitutional: Negative.   HENT: Negative.   Respiratory: Positive for cough and wheezing.   Cardiovascular: Negative.   Gastrointestinal: Negative.   Musculoskeletal: Negative.   Skin: Negative.   Neurological: Negative.   Hematological: Negative.   Psychiatric/Behavioral: Negative.    No past medical history on file.  History   Social History  . Marital Status: Single    Spouse Name: N/A    Number of Children: N/A  . Years of Education: N/A   Occupational History  . Not on file.   Social History Main Topics  . Smoking status: Never Smoker   . Smokeless tobacco: Not on file  . Alcohol Use: Not on file  . Drug Use: Not on file  . Sexually Active: Not on file   Other Topics Concern  . Not on file   Social History Narrative  . No narrative on file    No past surgical history on file.  No family history on file.  No Known Allergies  Current Outpatient Prescriptions on File Prior to Visit  Medication Sig Dispense Refill  . loratadine (CLARITIN) 10 MG tablet Take 10 mg by mouth as needed. Allergies.      Marland Kitchen losartan (COZAAR) 100 MG tablet Take 1 tablet (100 mg total) by mouth daily.  90 tablet  3  . traZODone (DESYREL) 50 MG tablet TAKE 1/2 TO 1 TABLET AT BEDTIME AS NEEDED FOR INSOMNIA  30 tablet  0    BP 128/90  Temp 98.8 F (37.1 C) (Oral)  Wt 325 lb (147.419 kg)chart    Objective:   Physical Exam  Constitutional: He is oriented to person, place, and time. He appears well-developed and  well-nourished.  HENT:  Right Ear: External ear normal.  Left Ear: External ear normal.  Nose: Nose normal.  Mouth/Throat: Oropharynx is clear and moist.  Neck: Normal range of motion. Neck supple.  Cardiovascular: Normal rate, regular rhythm and normal heart sounds.   Pulmonary/Chest: Effort normal and breath sounds normal.  Musculoskeletal: Normal range of motion.  Neurological: He is alert and oriented to person, place, and time.  Skin: Skin is warm and dry.  Psychiatric: He has a normal mood and affect.          Assessment & Plan:  Assessment: Acute bronchitis, cough  Plan: Prednisone 60x3, 40x3, 20x3. Rest. Drink plenty of fluids. Over-the-counter cough suppressant as necessary. Patient to call the office if symptoms worsen or persist. Recheck a schedule, when necessary.

## 2012-01-08 ENCOUNTER — Other Ambulatory Visit: Payer: Self-pay | Admitting: Internal Medicine

## 2012-01-10 ENCOUNTER — Other Ambulatory Visit: Payer: Self-pay | Admitting: Internal Medicine

## 2012-02-08 ENCOUNTER — Other Ambulatory Visit: Payer: Self-pay | Admitting: Internal Medicine

## 2012-07-01 ENCOUNTER — Other Ambulatory Visit: Payer: Self-pay | Admitting: Internal Medicine

## 2012-07-12 ENCOUNTER — Other Ambulatory Visit: Payer: Self-pay | Admitting: Internal Medicine

## 2012-11-04 ENCOUNTER — Ambulatory Visit (INDEPENDENT_AMBULATORY_CARE_PROVIDER_SITE_OTHER): Payer: BC Managed Care – PPO | Admitting: Internal Medicine

## 2012-11-04 ENCOUNTER — Encounter: Payer: Self-pay | Admitting: Internal Medicine

## 2012-11-04 VITALS — BP 160/106 | HR 97 | Temp 97.6°F

## 2012-11-04 DIAGNOSIS — R05 Cough: Secondary | ICD-10-CM

## 2012-11-04 DIAGNOSIS — I10 Essential (primary) hypertension: Secondary | ICD-10-CM

## 2012-11-04 DIAGNOSIS — J309 Allergic rhinitis, unspecified: Secondary | ICD-10-CM

## 2012-11-04 DIAGNOSIS — J302 Other seasonal allergic rhinitis: Secondary | ICD-10-CM | POA: Insufficient documentation

## 2012-11-04 DIAGNOSIS — J45909 Unspecified asthma, uncomplicated: Secondary | ICD-10-CM | POA: Insufficient documentation

## 2012-11-04 MED ORDER — AMLODIPINE BESYLATE 5 MG PO TABS: 5.0000 mg | ORAL_TABLET | Freq: Every day | ORAL | Status: DC

## 2012-11-04 MED ORDER — PREDNISONE 20 MG PO TABS: ORAL_TABLET | ORAL | Status: DC

## 2012-11-04 NOTE — Patient Instructions (Addendum)
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   Can take the prednisone  inb the short run . Consider adding nasal cortisone  In the spring or when exposed to cats to avoid flare of this. Discuss this with PCP about how to prevent this problem in the future .  Your blood pressure is too high today .   You need a FU visit with Dr Cato Mulligan.    Follow up and lab work  Due . Limit alcohol dash diet .  Add norvasc medication  If readings out of office is over 140/90    Arterial Hypertension Arterial hypertension (high blood pressure) is a condition of elevated pressure in your blood vessels. Hypertension over a long period of time is a risk factor for strokes, heart attacks, and heart failure. It is also the leading cause of kidney (renal) failure.  CAUSES   In Adults -- Over 90% of all hypertension has no known cause. This is called essential or primary hypertension. In the other 10% of people with hypertension, the increase in blood pressure is caused by another disorder. This is called secondary hypertension. Important causes of secondary hypertension are:  Heavy alcohol use.  Obstructive sleep apnea.  Hyperaldosterosim (Conn's syndrome).  Steroid use.  Chronic kidney failure.  Hyperparathyroidism.  Medications.  Renal artery stenosis.  Pheochromocytoma.  Cushing's disease.  Coarctation of the aorta.  Scleroderma renal crisis.  Licorice (in excessive amounts).  Drugs (cocaine, methamphetamine). Your caregiver can explain any items above that apply to you.  In Children -- Secondary hypertension is more common and should always be considered.  Pregnancy -- Few women of childbearing age have high blood pressure. However, up to 10% of them develop hypertension of pregnancy. Generally, this will not harm the woman. It may be a sign of 3 complications of pregnancy: preeclampsia, HELLP syndrome, and eclampsia. Follow up and control  with medication is necessary. SYMPTOMS   This condition normally does not produce any noticeable symptoms. It is usually found during a routine exam.  Malignant hypertension is a late problem of high blood pressure. It may have the following symptoms:  Headaches.  Blurred vision.  End-organ damage (this means your kidneys, heart, lungs, and other organs are being damaged).  Stressful situations can increase the blood pressure. If a person with normal blood pressure has their blood pressure go up while being seen by their caregiver, this is often termed "white coat hypertension." Its importance is not known. It may be related with eventually developing hypertension or complications of hypertension.  Hypertension is often confused with mental tension, stress, and anxiety. DIAGNOSIS  The diagnosis is made by 3 separate blood pressure measurements. They are taken at least 1 week apart from each other. If there is organ damage from hypertension, the diagnosis may be made without repeat measurements. Hypertension is usually identified by having blood pressure readings:  Above 140/90 mmHg measured in both arms, at 3 separate times, over a couple weeks.  Over 130/80 mmHg should be considered a risk factor and may require treatment in patients with diabetes. Blood pressure readings over 120/80 mmHg are called "pre-hypertension" even in non-diabetic patients. To get a true blood pressure measurement, use the following guidelines. Be aware of the factors that can alter blood pressure readings.  Take measurements at least 1 hour after caffeine.  Take measurements 30 minutes after smoking and without any stress. This is another reason to quit  smoking  it raises your blood pressure.  Use a proper cuff size. Ask your caregiver if you are not sure about your cuff size.  Most home blood pressure cuffs are automatic. They will measure systolic and diastolic pressures. The systolic pressure is the  pressure reading at the start of sounds. Diastolic pressure is the pressure at which the sounds disappear. If you are elderly, measure pressures in multiple postures. Try sitting, lying or standing.  Sit at rest for a minimum of 5 minutes before taking measurements.  You should not be on any medications like decongestants. These are found in many cold medications.  Record your blood pressure readings and review them with your caregiver. If you have hypertension:  Your caregiver may do tests to be sure you do not have secondary hypertension (see "causes" above).  Your caregiver may also look for signs of metabolic syndrome. This is also called Syndrome X or Insulin Resistance Syndrome. You may have this syndrome if you have type 2 diabetes, abdominal obesity, and abnormal blood lipids in addition to hypertension.  Your caregiver will take your medical and family history and perform a physical exam.  Diagnostic tests may include blood tests (for glucose, cholesterol, potassium, and kidney function), a urinalysis, or an EKG. Other tests may also be necessary depending on your condition. PREVENTION  There are important lifestyle issues that you can adopt to reduce your chance of developing hypertension:  Maintain a normal weight.  Limit the amount of salt (sodium) in your diet.  Exercise often.  Limit alcohol intake.  Get enough potassium in your diet. Discuss specific advice with your caregiver.  Follow a DASH diet (dietary approaches to stop hypertension). This diet is rich in fruits, vegetables, and low-fat dairy products, and avoids certain fats. PROGNOSIS  Essential hypertension cannot be cured. Lifestyle changes and medical treatment can lower blood pressure and reduce complications. The prognosis of secondary hypertension depends on the underlying cause. Many people whose hypertension is controlled with medicine or lifestyle changes can live a normal, healthy life.  RISKS AND  COMPLICATIONS  While high blood pressure alone is not an illness, it often requires treatment due to its short- and long-term effects on many organs. Hypertension increases your risk for:  CVAs or strokes (cerebrovascular accident).  Heart failure due to chronically high blood pressure (hypertensive cardiomyopathy).  Heart attack (myocardial infarction).  Damage to the retina (hypertensive retinopathy).  Kidney failure (hypertensive nephropathy). Your caregiver can explain list items above that apply to you. Treatment of hypertension can significantly reduce the risk of complications. TREATMENT   For overweight patients, weight loss and regular exercise are recommended. Physical fitness lowers blood pressure.  Mild hypertension is usually treated with diet and exercise. A diet rich in fruits and vegetables, fat-free dairy products, and foods low in fat and salt (sodium) can help lower blood pressure. Decreasing salt intake decreases blood pressure in a 1/3 of people.  Stop smoking if you are a smoker. The steps above are highly effective in reducing blood pressure. While these actions are easy to suggest, they are difficult to achieve. Most patients with moderate or severe hypertension end up requiring medications to bring their blood pressure down to a normal level. There are several classes of medications for treatment. Blood pressure pills (antihypertensives) will lower blood pressure by their different actions. Lowering the blood pressure by 10 mmHg may decrease the risk of complications by as much as 25%. The goal of treatment is effective blood pressure control.  This will reduce your risk for complications. Your caregiver will help you determine the best treatment for you according to your lifestyle. What is excellent treatment for one person, may not be for you. HOME CARE INSTRUCTIONS   Do not smoke.  Follow the lifestyle changes outlined in the "Prevention" section.  If you are on  medications, follow the directions carefully. Blood pressure medications must be taken as prescribed. Skipping doses reduces their benefit. It also puts you at risk for problems.  Follow up with your caregiver, as directed.  If you are asked to monitor your blood pressure at home, follow the guidelines in the "Diagnosis" section above. SEEK MEDICAL CARE IF:   You think you are having medication side effects.  You have recurrent headaches or lightheadedness.  You have swelling in your ankles.  You have trouble with your vision. SEEK IMMEDIATE MEDICAL CARE IF:   You have sudden onset of chest pain or pressure, difficulty breathing, or other symptoms of a heart attack.  You have a severe headache.  You have symptoms of a stroke (such as sudden weakness, difficulty speaking, difficulty walking). MAKE SURE YOU:   Understand these instructions.  Will watch your condition.  Will get help right away if you are not doing well or get worse. Document Released: 06/17/2005 Document Revised: 09/09/2011 Document Reviewed: 01/15/2007 [facility] Patient Information 2013 [place], [place].   Bronchitis Bronchitis is the body's way of reacting to injury and/or infection (inflammation) of the bronchi. Bronchi are the air tubes that extend from the windpipe into the lungs. If the inflammation becomes severe, it may cause shortness of breath. CAUSES  Inflammation may be caused by:  A virus.  Germs (bacteria).  Dust.  Allergens.  Pollutants and many other irritants. The cells lining the bronchial tree are covered with tiny hairs (cilia). These constantly beat upward, away from the lungs, toward the mouth. This keeps the lungs free of pollutants. When these cells become too irritated and are unable to do their job, mucus begins to develop. This causes the characteristic cough of bronchitis. The cough clears the lungs when the cilia are unable to do their job. Without either of these protective  mechanisms, the mucus would settle in the lungs. Then you would develop pneumonia. Smoking is a common cause of bronchitis and can contribute to pneumonia. Stopping this habit is the single most important thing you can do to help yourself. TREATMENT   Your caregiver may prescribe an antibiotic if the cough is caused by bacteria. Also, medicines that open up your airways make it easier to breathe. Your caregiver may also recommend or prescribe an expectorant. It will loosen the mucus to be coughed up. Only take over-the-counter or prescription medicines for pain, discomfort, or fever as directed by your caregiver.  Removing whatever causes the problem (smoking, for example) is critical to preventing the problem from getting worse.  Cough suppressants may be prescribed for relief of cough symptoms.  Inhaled medicines may be prescribed to help with symptoms now and to help prevent problems from returning.  For those with recurrent (chronic) bronchitis, there may be a need for steroid medicines. SEEK IMMEDIATE MEDICAL CARE IF:   During treatment, you develop more pus-like mucus (purulent sputum).  You have a fever.  Your baby is older than 3 months with a rectal temperature of 102 F (38.9 C) or higher.  Your baby is 37 months old or younger with a rectal temperature of 100.4 F (38 C) or higher.  You become progressively more ill.  You have increased difficulty breathing, wheezing, or shortness of breath. It is necessary to seek immediate medical care if you are elderly or sick from any other disease. MAKE SURE YOU:   Understand these instructions.  Will watch your condition.  Will get help right away if you are not doing well or get worse. Document Released: 06/17/2005 Document Revised: 09/09/2011 Document Reviewed: 04/26/2008 [facility] Patient Information 2013 [place], [place].

## 2012-11-04 NOTE — Progress Notes (Signed)
Chief Complaint  Patient presents with  . Cough    Cough is productive of clear/white.  Ongoing for a week.  . Wheezing    HPI: Patient comes in today for SDA for  new problem evaluation. PCP not available today Patient states that he tends to have seasonal allergies with upper respiratory congestion sometimes sneezing and usually takes Claritin. However it he seems to be getting a problem with bronchitis almost every spring. He states he gets coughing and when he lets ago gets bad wheezing. He usually comes in and get that taken care of. Denies any fever or pneumonia hemoptysis he calls this bronchitis.   Now has bronchitis seasonal issue.   Coughing about a week.        States that he has some clear to milky colored phlegm. No true shortness of breath. Sometimes he gets itchy eyes and cough when he visits a household with cats Neg asthma as a child .   26 years.   In [place].  ROS: See pertinent positives and negatives per HPI. No chest pain or syncope last night he had about 10 beers friend came in from town. Has a history of hypertension on losartan every day thinks his blood pressure has been running around 150/90. Usually not this high. He states he is taking his medicine. He doesn't think he has had lab work or checkup for 2 years since his colonoscopy. But he states he's been doing well.  Nonsmoker. No diabetes.  No past medical history on file.  No family history on file.  History   Social History  . Marital Status: Single    Spouse Name: N/A    Number of Children: N/A  . Years of Education: N/A   Social History Main Topics  . Smoking status: Never Smoker   . Smokeless tobacco: None  . Alcohol Use: None  . Drug Use: None  . Sexually Active: None   Other Topics Concern  . None   Social History Narrative  . None    Outpatient Encounter Prescriptions as of 11/04/2012  Medication Sig Dispense Refill  . loratadine (CLARITIN) 10 MG tablet Take 10 mg by mouth as  needed. Allergies.      Marland Kitchen losartan (COZAAR) 100 MG tablet TAKE 1 TABLET (100 MG TOTAL) BY MOUTH DAILY.  90 tablet  3  . traZODone (DESYREL) 50 MG tablet TAKE 1/2 TO 1 TABLET AT BEDTIME AS NEEDED FOR INSOMNIA  30 tablet  0  . traZODone (DESYREL) 50 MG tablet TAKE 1/2 TO 1 TABLET AT BEDTIME AS NEEDED FOR INSOMNIA  30 tablet  0  . amLODipine (NORVASC) 5 MG tablet Take 1 tablet (5 mg total) by mouth daily.  30 tablet  0  . predniSONE (DELTASONE) 20 MG tablet Take 3 po qd for 2 days then 2 po qd for 3 days,or as directed  12 tablet  0   No facility-administered encounter medications on file as of 11/04/2012.    EXAM:  BP 160/106  Pulse 97  Temp(Src) 97.6 F (36.4 C) (Oral)  SpO2 98%  Body mass index is 0.00 kg/(m^2).  GENERAL: vitals reviewed and listed above, alert, oriented, appears well hydrated and in no acute distress he appears mildly congested no respiratory distress has facial erythema no papules  HEENT: atraumatic, conjunctiva  clear, no obvious abnormalities on inspection of external nose and ears nose is congested no facial pain OP : no lesion edema or exudate no lesions good airway.  NECK: no obvious masses on inspection palpation no JVD no obvious adenopathy  LUNGS: clear to auscultation bilaterally, no wheezes, rales or rhonchi,  rare wheeze right chest reasonable air movement no retraction  CV: HRRR, no obvious gallop or murmur no clubbing cyanosis or nl cap refill   MS: moves all extremities without noticeable focal  abnormality  PSYCH: pleasant and cooperative, no obvious depression or anxiety  ASSESSMENT AND PLAN:  Discussed the following assessment and plan:  Asthmatic bronchitis  Unspecified essential hypertension - Very elevated today is taking medication appears to not have routine followup nor labs done See PCP in 1 month add norvasc tomeds .  other LSI    Allergic rhinitis, seasonal  Cough - Presumed reactive airway based on seasonal history. Will do  empiric therapy with prednisone although it is uncertain if he has asthma versus reactive airway I don't see signs of acute infection today.review the record he was seen last year in June.  Alarm symptoms discussed -Patient advised to return or notify health care team  if symptoms worsen or persist or new concerns arise.  Patient Instructions  This may be    Asthmatic bronchitis which acts like a formof asthma  Continue the claritin .   Can take the prednisone  inb the short run . Consider adding nasal cortisone  In the spring or when exposed to cats to avoid flare of this. Discuss this with PCP about how to prevent this problem in the future .  Your blood pressure is too high today .   You need a FU visit with Dr Cato Mulligan.    Follow up and lab work  Due . Limit alcohol dash diet .  Add norvasc medication  If readings out of office is over 140/90    Arterial Hypertension Arterial hypertension (high blood pressure) is a condition of elevated pressure in your blood vessels. Hypertension over a long period of time is a risk factor for strokes, heart attacks, and heart failure. It is also the leading cause of kidney (renal) failure.  CAUSES   In Adults -- Over 90% of all hypertension has no known cause. This is called essential or primary hypertension. In the other 10% of people with hypertension, the increase in blood pressure is caused by another disorder. This is called secondary hypertension. Important causes of secondary hypertension are:  Heavy alcohol use.  Obstructive sleep apnea.  Hyperaldosterosim (Conn's syndrome).  Steroid use.  Chronic kidney failure.  Hyperparathyroidism.  Medications.  Renal artery stenosis.  Pheochromocytoma.  Cushing's disease.  Coarctation of the aorta.  Scleroderma renal crisis.  Licorice (in excessive amounts).  Drugs (cocaine, methamphetamine). Your caregiver can explain any items above that apply to you.  In Children -- Secondary  hypertension is more common and should always be considered.  Pregnancy -- Few women of childbearing age have high blood pressure. However, up to 10% of them develop hypertension of pregnancy. Generally, this will not harm the woman. It may be a sign of 3 complications of pregnancy: preeclampsia, HELLP syndrome, and eclampsia. Follow up and control with medication is necessary. SYMPTOMS   This condition normally does not produce any noticeable symptoms. It is usually found during a routine exam.  Malignant hypertension is a late problem of high blood pressure. It may have the following symptoms:  Headaches.  Blurred vision.  End-organ damage (this means your kidneys, heart, lungs, and other organs are being damaged).  Stressful situations can increase the blood pressure. If a person  with normal blood pressure has their blood pressure go up while being seen by their caregiver, this is often termed "white coat hypertension." Its importance is not known. It may be related with eventually developing hypertension or complications of hypertension.  Hypertension is often confused with mental tension, stress, and anxiety. DIAGNOSIS  The diagnosis is made by 3 separate blood pressure measurements. They are taken at least 1 week apart from each other. If there is organ damage from hypertension, the diagnosis may be made without repeat measurements. Hypertension is usually identified by having blood pressure readings:  Above 140/90 mmHg measured in both arms, at 3 separate times, over a couple weeks.  Over 130/80 mmHg should be considered a risk factor and may require treatment in patients with diabetes. Blood pressure readings over 120/80 mmHg are called "pre-hypertension" even in non-diabetic patients. To get a true blood pressure measurement, use the following guidelines. Be aware of the factors that can alter blood pressure readings.  Take measurements at least 1 hour after caffeine.  Take  measurements 30 minutes after smoking and without any stress. This is another reason to quit smoking  it raises your blood pressure.  Use a proper cuff size. Ask your caregiver if you are not sure about your cuff size.  Most home blood pressure cuffs are automatic. They will measure systolic and diastolic pressures. The systolic pressure is the pressure reading at the start of sounds. Diastolic pressure is the pressure at which the sounds disappear. If you are elderly, measure pressures in multiple postures. Try sitting, lying or standing.  Sit at rest for a minimum of 5 minutes before taking measurements.  You should not be on any medications like decongestants. These are found in many cold medications.  Record your blood pressure readings and review them with your caregiver. If you have hypertension:  Your caregiver may do tests to be sure you do not have secondary hypertension (see "causes" above).  Your caregiver may also look for signs of metabolic syndrome. This is also called Syndrome X or Insulin Resistance Syndrome. You may have this syndrome if you have type 2 diabetes, abdominal obesity, and abnormal blood lipids in addition to hypertension.  Your caregiver will take your medical and family history and perform a physical exam.  Diagnostic tests may include blood tests (for glucose, cholesterol, potassium, and kidney function), a urinalysis, or an EKG. Other tests may also be necessary depending on your condition. PREVENTION  There are important lifestyle issues that you can adopt to reduce your chance of developing hypertension:  Maintain a normal weight.  Limit the amount of salt (sodium) in your diet.  Exercise often.  Limit alcohol intake.  Get enough potassium in your diet. Discuss specific advice with your caregiver.  Follow a DASH diet (dietary approaches to stop hypertension). This diet is rich in fruits, vegetables, and low-fat dairy products, and avoids certain  fats. PROGNOSIS  Essential hypertension cannot be cured. Lifestyle changes and medical treatment can lower blood pressure and reduce complications. The prognosis of secondary hypertension depends on the underlying cause. Many people whose hypertension is controlled with medicine or lifestyle changes can live a normal, healthy life.  RISKS AND COMPLICATIONS  While high blood pressure alone is not an illness, it often requires treatment due to its short- and long-term effects on many organs. Hypertension increases your risk for:  CVAs or strokes (cerebrovascular accident).  Heart failure due to chronically high blood pressure (hypertensive cardiomyopathy).  Heart attack (myocardial infarction).  Damage to the retina (hypertensive retinopathy).  Kidney failure (hypertensive nephropathy). Your caregiver can explain list items above that apply to you. Treatment of hypertension can significantly reduce the risk of complications. TREATMENT   For overweight patients, weight loss and regular exercise are recommended. Physical fitness lowers blood pressure.  Mild hypertension is usually treated with diet and exercise. A diet rich in fruits and vegetables, fat-free dairy products, and foods low in fat and salt (sodium) can help lower blood pressure. Decreasing salt intake decreases blood pressure in a 1/3 of people.  Stop smoking if you are a smoker. The steps above are highly effective in reducing blood pressure. While these actions are easy to suggest, they are difficult to achieve. Most patients with moderate or severe hypertension end up requiring medications to bring their blood pressure down to a normal level. There are several classes of medications for treatment. Blood pressure pills (antihypertensives) will lower blood pressure by their different actions. Lowering the blood pressure by 10 mmHg may decrease the risk of complications by as much as 25%. The goal of treatment is effective blood  pressure control. This will reduce your risk for complications. Your caregiver will help you determine the best treatment for you according to your lifestyle. What is excellent treatment for one person, may not be for you. HOME CARE INSTRUCTIONS   Do not smoke.  Follow the lifestyle changes outlined in the "Prevention" section.  If you are on medications, follow the directions carefully. Blood pressure medications must be taken as prescribed. Skipping doses reduces their benefit. It also puts you at risk for problems.  Follow up with your caregiver, as directed.  If you are asked to monitor your blood pressure at home, follow the guidelines in the "Diagnosis" section above. SEEK MEDICAL CARE IF:   You think you are having medication side effects.  You have recurrent headaches or lightheadedness.  You have swelling in your ankles.  You have trouble with your vision. SEEK IMMEDIATE MEDICAL CARE IF:   You have sudden onset of chest pain or pressure, difficulty breathing, or other symptoms of a heart attack.  You have a severe headache.  You have symptoms of a stroke (such as sudden weakness, difficulty speaking, difficulty walking). MAKE SURE YOU:   Understand these instructions.  Will watch your condition.  Will get help right away if you are not doing well or get worse. Document Released: 06/17/2005 Document Revised: 09/09/2011 Document Reviewed: 01/15/2007 [facility] Patient Information 2013 [place], [place].   Bronchitis Bronchitis is the body's way of reacting to injury and/or infection (inflammation) of the bronchi. Bronchi are the air tubes that extend from the windpipe into the lungs. If the inflammation becomes severe, it may cause shortness of breath. CAUSES  Inflammation may be caused by:  A virus.  Germs (bacteria).  Dust.  Allergens.  Pollutants and many other irritants. The cells lining the bronchial tree are covered with tiny hairs (cilia). These  constantly beat upward, away from the lungs, toward the mouth. This keeps the lungs free of pollutants. When these cells become too irritated and are unable to do their job, mucus begins to develop. This causes the characteristic cough of bronchitis. The cough clears the lungs when the cilia are unable to do their job. Without either of these protective mechanisms, the mucus would settle in the lungs. Then you would develop pneumonia. Smoking is a common cause of bronchitis and can contribute to pneumonia. Stopping this habit is the single most important  thing you can do to help yourself. TREATMENT   Your caregiver may prescribe an antibiotic if the cough is caused by bacteria. Also, medicines that open up your airways make it easier to breathe. Your caregiver may also recommend or prescribe an expectorant. It will loosen the mucus to be coughed up. Only take over-the-counter or prescription medicines for pain, discomfort, or fever as directed by your caregiver.  Removing whatever causes the problem (smoking, for example) is critical to preventing the problem from getting worse.  Cough suppressants may be prescribed for relief of cough symptoms.  Inhaled medicines may be prescribed to help with symptoms now and to help prevent problems from returning.  For those with recurrent (chronic) bronchitis, there may be a need for steroid medicines. SEEK IMMEDIATE MEDICAL CARE IF:   During treatment, you develop more pus-like mucus (purulent sputum).  You have a fever.  Your baby is older than 3 months with a rectal temperature of 102 F (38.9 C) or higher.  Your baby is 8 months old or younger with a rectal temperature of 100.4 F (38 C) or higher.  You become progressively more ill.  You have increased difficulty breathing, wheezing, or shortness of breath. It is necessary to seek immediate medical care if you are elderly or sick from any other disease. MAKE SURE YOU:   Understand these  instructions.  Will watch your condition.  Will get help right away if you are not doing well or get worse. Document Released: 06/17/2005 Document Revised: 09/09/2011 Document Reviewed: 04/26/2008 [facility] Patient Information 2013 [place], [place].      Neta Mends. Panosh M.D.

## 2012-11-24 ENCOUNTER — Emergency Department (HOSPITAL_COMMUNITY)
Admission: EM | Admit: 2012-11-24 | Discharge: 2012-11-24 | Disposition: A | Discharge status: Home / Self Care | Payer: BC Managed Care – PPO | Attending: Emergency Medicine | Admitting: Emergency Medicine

## 2012-11-24 ENCOUNTER — Emergency Department (HOSPITAL_COMMUNITY): Payer: BC Managed Care – PPO

## 2012-11-24 ENCOUNTER — Encounter (HOSPITAL_COMMUNITY): Payer: Self-pay | Admitting: Emergency Medicine

## 2012-11-24 ENCOUNTER — Telehealth: Payer: Self-pay | Admitting: Internal Medicine

## 2012-11-24 DIAGNOSIS — Y9389 Activity, other specified: Secondary | ICD-10-CM | POA: Insufficient documentation

## 2012-11-24 DIAGNOSIS — M1610 Unilateral primary osteoarthritis, unspecified hip: Secondary | ICD-10-CM | POA: Insufficient documentation

## 2012-11-24 DIAGNOSIS — R296 Repeated falls: Secondary | ICD-10-CM | POA: Insufficient documentation

## 2012-11-24 DIAGNOSIS — M25462 Effusion, left knee: Secondary | ICD-10-CM

## 2012-11-24 DIAGNOSIS — M25469 Effusion, unspecified knee: Secondary | ICD-10-CM | POA: Insufficient documentation

## 2012-11-24 DIAGNOSIS — Y929 Unspecified place or not applicable: Secondary | ICD-10-CM | POA: Insufficient documentation

## 2012-11-24 DIAGNOSIS — M1611 Unilateral primary osteoarthritis, right hip: Secondary | ICD-10-CM

## 2012-11-24 DIAGNOSIS — S8990XA Unspecified injury of unspecified lower leg, initial encounter: Secondary | ICD-10-CM | POA: Insufficient documentation

## 2012-11-24 DIAGNOSIS — M25559 Pain in unspecified hip: Secondary | ICD-10-CM | POA: Insufficient documentation

## 2012-11-24 DIAGNOSIS — X500XXA Overexertion from strenuous movement or load, initial encounter: Secondary | ICD-10-CM | POA: Insufficient documentation

## 2012-11-24 DIAGNOSIS — M169 Osteoarthritis of hip, unspecified: Secondary | ICD-10-CM | POA: Insufficient documentation

## 2012-11-24 DIAGNOSIS — I10 Essential (primary) hypertension: Secondary | ICD-10-CM | POA: Insufficient documentation

## 2012-11-24 HISTORY — DX: Essential (primary) hypertension: I10

## 2012-11-24 NOTE — ED Provider Notes (Signed)
History    This chart was scribed for Roxy Horseman PA-C, a non-physician practitioner working with Gwyneth Sprout, MD by Lewanda Rife, ED Scribe. This patient was seen in room WTR7/WTR7 and the patient's care was started at 1555.     CSN: [id number]  Arrival date & time 11/24/12  1436   First MD Initiated Contact with Patient 11/24/12 1505      Chief Complaint  Patient presents with  . Knee Pain  . Groin Pain    (Consider location/radiation/quality/duration/timing/severity/associated sxs/prior treatment) The history is provided by the patient.   HPI Comments: Donald [name] is a 57 y.o. male who presents to the Emergency Department complaining of constant moderate left knee pain onset 4 days after stepping out of a car and left knee "buckling" causing him to fall and strain his groin. Reports right groin pain radiating to right buttock since incident. Describes pain as shooting sensation. Denies testicular pain, penile discharge, urinary or bowel incontinence, paresthesias, nausea, emesis, fever, weakness, hematuria, scrotal pain, and back pain. Reports pain is aggravated by movement and weight bearing. Reports icing left knee and trying ibuprofen with mild relief of symptoms. Reports having left knee aspirated therapeutically 14 years ago after injury.   Past Medical History  Diagnosis Date  . Hypertension     History reviewed. No pertinent past surgical history.  No family history on file.  History  Substance Use Topics  . Smoking status: Never Smoker   . Smokeless tobacco: Not on file  . Alcohol Use: No      Review of Systems  Musculoskeletal: Positive for arthralgias (knee pain ). Myalgias: groin pain        Right groin pain   Skin: Negative for wound.  All other systems reviewed and are negative.  A complete 10 system review of systems was obtained and all systems are negative except as noted in the HPI and PMH.     Allergies  Review of patient's  allergies indicates no known allergies.  Home Medications   Current Outpatient Rx  Name  Route  Sig  Dispense  Refill  . loratadine (CLARITIN) 10 MG tablet   Oral   Take 10 mg by mouth as needed. Allergies.         Marland Kitchen losartan (COZAAR) 100 MG tablet      TAKE 1 TABLET (100 MG TOTAL) BY MOUTH DAILY.   90 tablet   3   . Pramoxine-Menthol-Dimethicone (GOLD BOND MEDICATED ANTI ITCH) 1-0.5-5 % LOTN   Apply externally   Apply 1 application topically as needed (for itch.).         Marland Kitchen traZODone (DESYREL) 50 MG tablet      TAKE 1/2 TO 1 TABLET AT BEDTIME AS NEEDED FOR INSOMNIA   30 tablet   0     NEEDS OV     BP 158/92  Pulse 91  Temp(Src) 98 F (36.7 C) (Oral)  Resp 16  SpO2 97%  Physical Exam  Nursing note and vitals reviewed. Constitutional: He is oriented to person, place, and time. He appears well-developed and well-nourished. No distress.  Pt morbidly obese  HENT:  Head: Normocephalic and atraumatic.  Eyes: EOM are normal.  Neck: Neck supple. No tracheal deviation present.  Cardiovascular: Normal rate.   Pulmonary/Chest: Effort normal. No respiratory distress.  Abdominal: Soft. He exhibits no distension. There is no tenderness. There is no rebound and no guarding. No hernia. Hernia confirmed negative in the ventral area, confirmed negative in the  right inguinal area and confirmed negative in the left inguinal area.  No signs of surgical or acute abdomen  Genitourinary: Testes normal and penis normal. Right testis shows no mass, no swelling and no tenderness. Left testis shows no mass, no swelling and no tenderness. Uncircumcised. No penile tenderness. No discharge found.  Musculoskeletal: Normal range of motion. He exhibits tenderness (left knee ).       Right hip: Normal. He exhibits no tenderness, no bony tenderness, no swelling and no deformity.       Left knee: He exhibits swelling. He exhibits no deformity. Tenderness (mildly) found. Medial joint line  tenderness noted.       Right upper leg: He exhibits tenderness (mild ). He exhibits no bony tenderness, no swelling and no deformity.  Left knee ROM and strength 5/5   Lymphadenopathy:       Right: No inguinal adenopathy present.       Left: No inguinal adenopathy present.  Neurological: He is alert and oriented to person, place, and time.  Skin: Skin is warm and dry.  Psychiatric: He has a normal mood and affect. His behavior is normal.    ED Course  Procedures (including critical care time) Medications - No data to display 4:56 PM Pt informed of x-ray results and encouraged to f/u with orthopedic referral for further evaluation Labs Reviewed - No data to display Dg Hip Complete Right  11/24/2012   *RADIOLOGY REPORT*  Clinical Data: Right groin pain.  RIGHT HIP - COMPLETE 2+ VIEW  Comparison: No priors.  Findings: AP view of the pelvis and AP and lateral views of the right hip demonstrate no definite acute displaced fractures. Femoral head is properly located.  There is joint space narrowing, subchondral sclerosis, subchondral cyst formation and osteophyte formation in the hip joints bilaterally (right greater than left), compatible with osteoarthritis.  IMPRESSION: 1.  No acute radiographic abnormality of the bony pelvis or the right hip. 2.  Bilateral hip joint osteoarthritis, severe on the right and moderate on the left.   Original Report Authenticated By: Trudie Reed, M.D.   Dg Knee Complete 4 Views Left  11/24/2012   *RADIOLOGY REPORT*  Clinical Data: Left knee pain  LEFT KNEE - COMPLETE 4+ VIEW  Comparison: None.  Findings: Tricompartmental left knee osteoarthritis noted with diffuse joint space loss, endplate bony spurring, and sclerosis. Normal alignment without acute fracture.  Suspect small to moderate effusion on the lateral view.  IMPRESSION: Moderate to severe left knee osteoarthritis with a small effusion.  No acute osseous finding.   Original Report Authenticated By: Judie Petit.  Shick, M.D.     1. Knee effusion, left   2. Osteoarthritis of right hip       MDM  Patient with knee effusion and arthritis of the right hip. Will discharge to home with orthopedic followup. Patient understands and agrees with the plan. He is stable and ready for discharge.     I personally performed the services described in this documentation, which was scribed in my presence. The recorded information has been reviewed and is accurate.     Roxy Horseman, PA-C 11/25/12 1911

## 2012-11-24 NOTE — Telephone Encounter (Signed)
Patient Information:  Caller Name: Khori  Phone: [phone]  Patient: Donald [name], Donald [name]  Gender: Male  DOB: 07/29/1955  Age: 57 Years  PCP: Birdie Sons (Adults only)  Office Follow Up:  Does the office need to follow up with this patient?: No  Instructions For The Office: N/A  RN Note:  11/21/2012 fell on knee Saturday, left knee twisted and slightly swollen. Today, 11/24/2012 lost balance when the injured left knee gave out and he states, "I almost did like a split" and coworker lifted up. Now the left knee swollen (1.5 times normal size and has been icing and elevating), stiff and"can hardley bend it". At the time he did a "split", the right groin hurt and is now painful: urination fine and scrotum normal. No appointments available and advised to go to the ER due to being a FALL RISK. Zayed requesting to go to [facility] and have RN/CAN call ahead. Called the ER and "Barbara Cower" asked RN/CAN to call back in 5 minutes. Cherrie Distance, RN back [phone] and gave him quick report.   Symptoms  Reason For Call & Symptoms: fell legs injured.  Reviewed Health History In EMR: Yes  Reviewed Medications In EMR: Yes  Reviewed Allergies In EMR: Yes  Reviewed Surgeries / Procedures: Yes  Date of Onset of Symptoms: 11/23/2012  Treatments Tried: Ice, rest and Ibuprofen.  Treatments Tried Worked: Yes  Guideline(s) Used:  Knee Injury  Disposition Per Guideline:   Go to ED Now (or to Office with PCP Approval)  Reason For Disposition Reached:   Can't stand (bear weight) or walk  Advice Given:  Reassurance - Direct Blow (Contusion, Bruise)  A direct blow to your knee can cause a contusion. Contusion is the medical term for bruise.  Symptoms are mild pain, swelling, and/or bruising.  Here is some care advice that should help.  Reassurance - Bending or Twisting Injury (Strain, Sprain):  Strain and sprain are the medical terms used to describe over-stretching of the muscles and  ligaments of the knee. A twisting or bending injury can cause a strain or sprain.  The main symptom is pain that is worse with movement and walking. Swelling can occur. Rarely there may be slight bruising.  Here is some care advice that should help.  Apply a Cold Pack:  Apply a cold pack or an ice bag (wrapped in a moist towel) to the area for 20 minutes. Repeat in 1 hour, then every 4 hours while awake.  Continue this for the first 48 hours after an injury.  This will help decrease pain and swelling.  Apply Heat to the Area:  Beginning 48 hours after an injury, apply a warm washcloth or heating pad for 10 minutes three times a day.  This will help increase blood flow and improve healing.  Elevate the Leg:  Lay down and put your leg on a pillow. This puts (elevates) the knee above the heart.  Do this for 15-20 minutes, 2-3 times a day, for the first two days.  This can also help decrease swelling, bruising, and pain.  Rest vs. Movement:  Movement is generally more healing in the long term than rest.  Continue normal activities (like walking) as much as your pain permits.  Expected Course:  Pain, swelling, and bruising usually start to get better 2 to 3 days after an injury.  It may take 2 weeks for pain and tenderness of the injured area to go away.  Call Back If:  Pain becomes severe  Pain does not improve after 3 days  Pain or swelling lasts more than 2 weeks  You become worse.  Patient Will Follow Care Advice:  YES

## 2012-11-24 NOTE — ED Notes (Signed)
Patient states that he had an injury to his left knee years ago and began having knee pain on Saturday. Patient states that he stepped out of the car yesterday and pain increased to the left knee. Patient has slight swelling. Patient states he also developed a sharp pain in his right groin that radiates into his right buttock.

## 2012-11-24 NOTE — ED Notes (Signed)
Pt alert, arrives from [facility], c/o right groin pain, left knee pain, onset over the weekend, worse today, resp even, unlabored, skin pwd

## 2012-11-26 ENCOUNTER — Other Ambulatory Visit: Payer: Self-pay | Admitting: Internal Medicine

## 2012-11-26 NOTE — ED Provider Notes (Signed)
Medical screening examination/treatment/procedure(s) were performed by non-physician practitioner and as supervising physician I was immediately available for consultation/collaboration.   Gwyneth Sprout, MD 11/26/12 1506

## 2012-12-22 ENCOUNTER — Other Ambulatory Visit: Payer: Self-pay | Admitting: Internal Medicine

## 2012-12-22 MED ORDER — TRAZODONE HCL 50 MG PO TABS: ORAL_TABLET | ORAL | Status: DC

## 2012-12-23 ENCOUNTER — Other Ambulatory Visit: Payer: Self-pay | Admitting: Orthopedic Surgery

## 2012-12-25 ENCOUNTER — Encounter (HOSPITAL_COMMUNITY): Payer: Self-pay

## 2013-01-04 ENCOUNTER — Encounter (HOSPITAL_COMMUNITY): Payer: Self-pay

## 2013-01-04 ENCOUNTER — Ambulatory Visit (HOSPITAL_COMMUNITY)
Admission: RE | Admit: 2013-01-04 | Discharge: 2013-01-04 | Disposition: A | Discharge status: Home / Self Care | Payer: BC Managed Care – PPO | Source: Ambulatory Visit | Attending: Orthopedic Surgery | Admitting: Orthopedic Surgery

## 2013-01-04 ENCOUNTER — Encounter (HOSPITAL_COMMUNITY)
Admission: RE | Admit: 2013-01-04 | Discharge: 2013-01-04 | Disposition: A | Discharge status: Home / Self Care | Payer: BC Managed Care – PPO | Source: Ambulatory Visit | Attending: Orthopedic Surgery | Admitting: Orthopedic Surgery

## 2013-01-04 DIAGNOSIS — I10 Essential (primary) hypertension: Secondary | ICD-10-CM | POA: Insufficient documentation

## 2013-01-04 DIAGNOSIS — Z0183 Encounter for blood typing: Secondary | ICD-10-CM | POA: Insufficient documentation

## 2013-01-04 DIAGNOSIS — Z01818 Encounter for other preprocedural examination: Secondary | ICD-10-CM | POA: Insufficient documentation

## 2013-01-04 DIAGNOSIS — Z01810 Encounter for preprocedural cardiovascular examination: Secondary | ICD-10-CM | POA: Insufficient documentation

## 2013-01-04 DIAGNOSIS — Z01812 Encounter for preprocedural laboratory examination: Secondary | ICD-10-CM | POA: Insufficient documentation

## 2013-01-04 HISTORY — DX: Diverticulitis of intestine, part unspecified, without perforation or abscess without bleeding: K57.92

## 2013-01-04 HISTORY — DX: Bronchitis, not specified as acute or chronic: J40

## 2013-01-04 HISTORY — DX: Unspecified osteoarthritis, unspecified site: M19.90

## 2013-01-04 LAB — URINALYSIS, ROUTINE W REFLEX MICROSCOPIC
Glucose, UA: NEGATIVE mg/dL
Hgb urine dipstick: NEGATIVE
Ketones, ur: NEGATIVE mg/dL
Protein, ur: NEGATIVE mg/dL

## 2013-01-04 LAB — PROTIME-INR
INR: 1.08 (ref 0.00–1.49)
Prothrombin Time: 13.8 seconds (ref 11.6–15.2)

## 2013-01-04 LAB — CBC WITH DIFFERENTIAL/PLATELET
Eosinophils Relative: 1 % (ref 0–5)
HCT: 39.9 % (ref 39.0–52.0)
Lymphocytes Relative: 33 % (ref 12–46)
Lymphs Abs: 3.3 10*3/uL (ref 0.7–4.0)
MCH: 30.7 pg (ref 26.0–34.0)
MCV: 88.1 fL (ref 78.0–100.0)
Monocytes Absolute: 0.9 10*3/uL (ref 0.1–1.0)
RBC: 4.53 MIL/uL (ref 4.22–5.81)
RDW: 13.2 % (ref 11.5–15.5)
WBC: 9.8 10*3/uL (ref 4.0–10.5)

## 2013-01-04 LAB — COMPREHENSIVE METABOLIC PANEL
CO2: 27 mEq/L (ref 19–32)
Calcium: 9.7 mg/dL (ref 8.4–10.5)
Creatinine, Ser: 1.06 mg/dL (ref 0.50–1.35)
GFR calc Af Amer: 89 mL/min — ABNORMAL LOW (ref >90)
GFR calc non Af Amer: 77 mL/min — ABNORMAL LOW (ref >90)
Glucose, Bld: 94 mg/dL (ref 70–99)

## 2013-01-04 LAB — SURGICAL PCR SCREEN: MRSA, PCR: NEGATIVE

## 2013-01-04 LAB — TYPE AND SCREEN: ABO/RH(D): B POS

## 2013-01-04 NOTE — Pre-Procedure Instructions (Signed)
WILHO SHARPLEY  01/04/2013   Your procedure is scheduled on:  Friday  01/08/13    Report to [facility] at 1100 AM.  Call this number if you have problems the morning of surgery: [phone]   Remember:   Do not eat food or drink liquids after midnight.   Take these medicines the morning of surgery with A SIP OF WATER: PERCOCET IF NEEDED FOR PAIN    Do not wear jewelry, make-up or nail polish.  Do not wear lotions, powders, or perfumes. You may wear deodorant.  Do not shave 48 hours prior to surgery. Men may shave face and neck.  Do not bring valuables to the hospital.  [facility] is not responsible                   for any belongings or valuables.  Contacts, dentures or bridgework may not be worn into surgery.  Leave suitcase in the car. After surgery it may be brought to your room.  For patients admitted to the hospital, checkout time is 11:00 AM the day of  discharge.   Patients discharged the day of surgery will not be allowed to drive  home.  Name and phone number of your driver:   Special Instructions: Shower using CHG 2 nights before surgery and the night before surgery.  If you shower the day of surgery use CHG.  Use special wash - you have one bottle of CHG for all showers.  You should use approximately 1/3 of the bottle for each shower.   Please read over the following fact sheets that you were given: Pain Booklet, Coughing and Deep Breathing, Blood Transfusion Information, Total Joint Packet, MRSA Information and Surgical Site Infection Prevention

## 2013-01-04 NOTE — Progress Notes (Signed)
01/04/13 1447  OBSTRUCTIVE SLEEP APNEA  Have you ever been diagnosed with sleep apnea through a sleep study? No  Do you snore loudly (loud enough to be heard through closed doors)?  1  Do you often feel tired, fatigued, or sleepy during the daytime? 0  Has anyone observed you stop breathing during your sleep? 0  Do you have, or are you being treated for high blood pressure? 1  BMI more than 35 kg/m2? 1  Age over 56 years old? 1  Neck circumference greater than 40 cm/18 inches? 1  Gender: 1  Obstructive Sleep Apnea Score 6  Score 4 or greater  Results sent to PCP

## 2013-01-07 MED ORDER — DEXTROSE 5 % IV SOLN
3.0000 g | INTRAVENOUS | Status: AC
  Administered 2013-01-08: 3 g via INTRAVENOUS
  Filled 2013-01-07: qty 3000

## 2013-01-07 NOTE — Progress Notes (Signed)
Pt made aware to report to [facility] at 10:15AM on January 08, 2013 due to surgery schedule time change. Pt made aware that surgery is scheduled for 12:15 PM  but he needs to arrive 2 hours earlier at 10:15AM.

## 2013-01-08 ENCOUNTER — Inpatient Hospital Stay (HOSPITAL_COMMUNITY)
Admission: RE | Admit: 2013-01-08 | Discharge: 2013-01-10 | DRG: 209 | Disposition: A | Discharge status: Home / Self Care | Payer: BC Managed Care – PPO | Source: Ambulatory Visit | Attending: Orthopedic Surgery | Admitting: Orthopedic Surgery

## 2013-01-08 ENCOUNTER — Encounter (HOSPITAL_COMMUNITY)
Admission: RE | Disposition: A | Discharge status: Home / Self Care | Payer: Self-pay | Source: Ambulatory Visit | Attending: Orthopedic Surgery

## 2013-01-08 ENCOUNTER — Encounter (HOSPITAL_COMMUNITY): Payer: Self-pay | Admitting: *Deleted

## 2013-01-08 ENCOUNTER — Ambulatory Visit (HOSPITAL_COMMUNITY): Payer: BC Managed Care – PPO | Admitting: *Deleted

## 2013-01-08 DIAGNOSIS — M1712 Unilateral primary osteoarthritis, left knee: Secondary | ICD-10-CM | POA: Diagnosis present

## 2013-01-08 DIAGNOSIS — M1710 Unilateral primary osteoarthritis, unspecified knee: Principal | ICD-10-CM | POA: Diagnosis present

## 2013-01-08 DIAGNOSIS — M794 Hypertrophy of (infrapatellar) fat pad: Secondary | ICD-10-CM | POA: Diagnosis present

## 2013-01-08 DIAGNOSIS — Z7982 Long term (current) use of aspirin: Secondary | ICD-10-CM

## 2013-01-08 DIAGNOSIS — M109 Gout, unspecified: Secondary | ICD-10-CM | POA: Diagnosis present

## 2013-01-08 DIAGNOSIS — I10 Essential (primary) hypertension: Secondary | ICD-10-CM | POA: Diagnosis present

## 2013-01-08 DIAGNOSIS — J45909 Unspecified asthma, uncomplicated: Secondary | ICD-10-CM | POA: Diagnosis present

## 2013-01-08 HISTORY — PX: TOTAL KNEE ARTHROPLASTY: SHX125

## 2013-01-08 SURGERY — ARTHROPLASTY, KNEE, TOTAL
Anesthesia: General | Site: Knee | Laterality: Left | Wound class: Clean

## 2013-01-08 MED ORDER — METHOCARBAMOL 100 MG/ML IJ SOLN
500.0000 mg | Freq: Four times a day (QID) | INTRAVENOUS | Status: DC | PRN
  Filled 2013-01-08: qty 5

## 2013-01-08 MED ORDER — ONDANSETRON HCL 4 MG/2ML IJ SOLN
INTRAMUSCULAR | Status: DC | PRN
  Administered 2013-01-08: 4 mg via INTRAVENOUS

## 2013-01-08 MED ORDER — NEOSTIGMINE METHYLSULFATE 1 MG/ML IJ SOLN
INTRAMUSCULAR | Status: DC | PRN
  Administered 2013-01-08: 5 mg via INTRAVENOUS

## 2013-01-08 MED ORDER — TRANEXAMIC ACID 100 MG/ML IV SOLN
1000.0000 mg | INTRAVENOUS | Status: AC
  Administered 2013-01-08: 1000 mg via INTRAVENOUS
  Filled 2013-01-08: qty 10

## 2013-01-08 MED ORDER — MIDAZOLAM HCL 2 MG/2ML IJ SOLN
INTRAMUSCULAR | Status: AC
  Administered 2013-01-08: 2 mg
  Filled 2013-01-08: qty 2

## 2013-01-08 MED ORDER — DOCUSATE SODIUM 100 MG PO CAPS
100.0000 mg | ORAL_CAPSULE | Freq: Two times a day (BID) | ORAL | Status: DC
  Administered 2013-01-08 – 2013-01-10 (×4): 100 mg via ORAL
  Filled 2013-01-08 (×4): qty 1

## 2013-01-08 MED ORDER — CEFAZOLIN SODIUM-DEXTROSE 2-3 GM-% IV SOLR
2.0000 g | Freq: Four times a day (QID) | INTRAVENOUS | Status: AC
  Administered 2013-01-08 – 2013-01-09 (×2): 2 g via INTRAVENOUS
  Filled 2013-01-08 (×3): qty 50

## 2013-01-08 MED ORDER — LACTATED RINGERS IV SOLN
INTRAVENOUS | Status: DC
  Administered 2013-01-08: 13:00:00 via INTRAVENOUS

## 2013-01-08 MED ORDER — HYDROMORPHONE HCL PF 1 MG/ML IJ SOLN
INTRAMUSCULAR | Status: AC
  Filled 2013-01-08: qty 1

## 2013-01-08 MED ORDER — TRAZODONE 25 MG HALF TABLET
25.0000 mg | ORAL_TABLET | Freq: Every evening | ORAL | Status: DC | PRN
  Filled 2013-01-08: qty 2

## 2013-01-08 MED ORDER — OXYCODONE-ACETAMINOPHEN 5-325 MG PO TABS
1.0000 | ORAL_TABLET | ORAL | Status: DC | PRN
  Administered 2013-01-08 – 2013-01-10 (×7): 2 via ORAL
  Filled 2013-01-08 (×7): qty 2

## 2013-01-08 MED ORDER — HYDROMORPHONE HCL PF 1 MG/ML IJ SOLN
0.2500 mg | INTRAMUSCULAR | Status: DC | PRN
  Administered 2013-01-08 (×2): 0.5 mg via INTRAVENOUS

## 2013-01-08 MED ORDER — ONDANSETRON HCL 4 MG/2ML IJ SOLN: 4.0000 mg | Freq: Four times a day (QID) | INTRAMUSCULAR | Status: DC | PRN

## 2013-01-08 MED ORDER — ASPIRIN EC 325 MG PO TBEC
325.0000 mg | DELAYED_RELEASE_TABLET | Freq: Two times a day (BID) | ORAL | Status: DC
  Administered 2013-01-09 – 2013-01-10 (×3): 325 mg via ORAL
  Filled 2013-01-08 (×5): qty 1

## 2013-01-08 MED ORDER — FENTANYL CITRATE 0.05 MG/ML IJ SOLN
INTRAMUSCULAR | Status: DC | PRN
  Administered 2013-01-08: 100 ug via INTRAVENOUS
  Administered 2013-01-08: 50 ug via INTRAVENOUS
  Administered 2013-01-08: 100 ug via INTRAVENOUS
  Administered 2013-01-08: 50 ug via INTRAVENOUS
  Administered 2013-01-08 (×2): 100 ug via INTRAVENOUS

## 2013-01-08 MED ORDER — OXYCODONE HCL 5 MG PO TABS: 5.0000 mg | ORAL_TABLET | Freq: Once | ORAL | Status: DC | PRN

## 2013-01-08 MED ORDER — ROCURONIUM BROMIDE 100 MG/10ML IV SOLN
INTRAVENOUS | Status: DC | PRN
  Administered 2013-01-08: 50 mg via INTRAVENOUS

## 2013-01-08 MED ORDER — DIPHENHYDRAMINE HCL 12.5 MG/5ML PO ELIX: 12.5000 mg | ORAL_SOLUTION | ORAL | Status: DC | PRN

## 2013-01-08 MED ORDER — OXYCODONE HCL 5 MG/5ML PO SOLN
5.0000 mg | Freq: Once | ORAL | Status: DC | PRN
Start: 2013-01-08 — End: 2013-01-08

## 2013-01-08 MED ORDER — ALUM & MAG HYDROXIDE-SIMETH 200-200-20 MG/5ML PO SUSP: 30.0000 mL | ORAL | Status: DC | PRN

## 2013-01-08 MED ORDER — DEXTROSE-NACL 5-0.45 % IV SOLN
INTRAVENOUS | Status: DC
  Administered 2013-01-08: via INTRAVENOUS

## 2013-01-08 MED ORDER — PROPOFOL 10 MG/ML IV BOLUS
INTRAVENOUS | Status: DC | PRN
  Administered 2013-01-08: 200 mg via INTRAVENOUS
  Administered 2013-01-08: 100 mg via INTRAVENOUS

## 2013-01-08 MED ORDER — POLYETHYLENE GLYCOL 3350 17 G PO PACK: 17.0000 g | PACK | Freq: Every day | ORAL | Status: DC | PRN

## 2013-01-08 MED ORDER — DEXAMETHASONE SODIUM PHOSPHATE 10 MG/ML IJ SOLN
INTRAMUSCULAR | Status: DC | PRN
  Administered 2013-01-08: 10 mg via INTRAVENOUS

## 2013-01-08 MED ORDER — DEXAMETHASONE SODIUM PHOSPHATE 10 MG/ML IJ SOLN
10.0000 mg | Freq: Three times a day (TID) | INTRAMUSCULAR | Status: AC
  Filled 2013-01-08 (×3): qty 1

## 2013-01-08 MED ORDER — FERROUS SULFATE 325 (65 FE) MG PO TABS
325.0000 mg | ORAL_TABLET | Freq: Two times a day (BID) | ORAL | Status: DC
  Administered 2013-01-09 – 2013-01-10 (×3): 325 mg via ORAL
  Filled 2013-01-08 (×5): qty 1

## 2013-01-08 MED ORDER — MIDAZOLAM HCL 2 MG/2ML IJ SOLN: 1.0000 mg | INTRAMUSCULAR | Status: DC | PRN

## 2013-01-08 MED ORDER — PROMETHAZINE HCL 25 MG/ML IJ SOLN: 12.5000 mg | Freq: Four times a day (QID) | INTRAMUSCULAR | Status: DC | PRN

## 2013-01-08 MED ORDER — HYDROMORPHONE HCL PF 1 MG/ML IJ SOLN
INTRAMUSCULAR | Status: AC
  Administered 2013-01-08: 0.5 mg via INTRAVENOUS
  Filled 2013-01-08: qty 1

## 2013-01-08 MED ORDER — FENTANYL CITRATE 0.05 MG/ML IJ SOLN
50.0000 ug | INTRAMUSCULAR | Status: AC | PRN
  Administered 2013-01-08 (×2): 50 ug via INTRAVENOUS

## 2013-01-08 MED ORDER — HYDROMORPHONE HCL PF 1 MG/ML IJ SOLN
1.0000 mg | INTRAMUSCULAR | Status: DC | PRN
  Administered 2013-01-08 (×2): 1 mg via INTRAVENOUS
  Administered 2013-01-09: 2 mg via INTRAVENOUS
  Administered 2013-01-09: 1 mg via INTRAVENOUS
  Administered 2013-01-09: 2 mg via INTRAVENOUS
  Filled 2013-01-08: qty 1
  Filled 2013-01-08 (×2): qty 2
  Filled 2013-01-08 (×2): qty 1

## 2013-01-08 MED ORDER — MIDAZOLAM HCL 5 MG/5ML IJ SOLN
INTRAMUSCULAR | Status: DC | PRN
  Administered 2013-01-08: 2 mg via INTRAVENOUS

## 2013-01-08 MED ORDER — LOSARTAN POTASSIUM 50 MG PO TABS: 100.0000 mg | ORAL_TABLET | Freq: Every day | ORAL | Status: DC

## 2013-01-08 MED ORDER — LACTATED RINGERS IV SOLN
INTRAVENOUS | Status: DC | PRN
  Administered 2013-01-08 (×3): via INTRAVENOUS

## 2013-01-08 MED ORDER — GLYCOPYRROLATE 0.2 MG/ML IJ SOLN
INTRAMUSCULAR | Status: DC | PRN
  Administered 2013-01-08: 0.6 mg via INTRAVENOUS

## 2013-01-08 MED ORDER — LOSARTAN POTASSIUM 50 MG PO TABS
100.0000 mg | ORAL_TABLET | Freq: Every day | ORAL | Status: DC
  Administered 2013-01-08 – 2013-01-10 (×3): 100 mg via ORAL
  Filled 2013-01-08 (×3): qty 2

## 2013-01-08 MED ORDER — BUPIVACAINE-EPINEPHRINE PF 0.5-1:200000 % IJ SOLN
INTRAMUSCULAR | Status: DC | PRN
  Administered 2013-01-08: 30 mL

## 2013-01-08 MED ORDER — CELECOXIB 200 MG PO CAPS
200.0000 mg | ORAL_CAPSULE | Freq: Two times a day (BID) | ORAL | Status: DC
  Administered 2013-01-08 – 2013-01-10 (×4): 200 mg via ORAL
  Filled 2013-01-08 (×5): qty 1

## 2013-01-08 MED ORDER — FENTANYL CITRATE 0.05 MG/ML IJ SOLN
INTRAMUSCULAR | Status: AC
  Filled 2013-01-08: qty 2

## 2013-01-08 MED ORDER — LIDOCAINE HCL (CARDIAC) 20 MG/ML IV SOLN
INTRAVENOUS | Status: DC | PRN
  Administered 2013-01-08: 80 mg via INTRAVENOUS

## 2013-01-08 MED ORDER — METHOCARBAMOL 500 MG PO TABS
500.0000 mg | ORAL_TABLET | Freq: Four times a day (QID) | ORAL | Status: DC | PRN
  Administered 2013-01-09 – 2013-01-10 (×3): 500 mg via ORAL
  Filled 2013-01-08 (×3): qty 1

## 2013-01-08 MED ORDER — SODIUM CHLORIDE 0.9 % IR SOLN
Status: DC | PRN
  Administered 2013-01-08: 3000 mL
  Administered 2013-01-08: 1000 mL

## 2013-01-08 MED ORDER — DEXAMETHASONE 4 MG PO TABS
10.0000 mg | ORAL_TABLET | Freq: Three times a day (TID) | ORAL | Status: AC
  Administered 2013-01-08 – 2013-01-09 (×3): 10 mg via ORAL
  Filled 2013-01-08 (×4): qty 1

## 2013-01-08 MED ORDER — ONDANSETRON HCL 4 MG PO TABS: 4.0000 mg | ORAL_TABLET | Freq: Four times a day (QID) | ORAL | Status: DC | PRN

## 2013-01-08 MED ORDER — POVIDONE-IODINE 7.5 % EX SOLN
Freq: Once | CUTANEOUS | Status: DC
  Filled 2013-01-08: qty 118

## 2013-01-08 SURGICAL SUPPLY — 72 items
BANDAGE ESMARK 6X9 LF (GAUZE/BANDAGES/DRESSINGS) ×1 IMPLANT
BENZOIN TINCTURE PRP APPL 2/3 (GAUZE/BANDAGES/DRESSINGS) ×2 IMPLANT
BLADE SAGITTAL 25.0X1.19X90 (BLADE) ×2 IMPLANT
BLADE SAW SAG 90X13X1.27 (BLADE) ×2 IMPLANT
BLADE SURG 10 STRL SS (BLADE) ×2 IMPLANT
BNDG ESMARK 4X9 LF (GAUZE/BANDAGES/DRESSINGS) IMPLANT
BNDG ESMARK 6X9 LF (GAUZE/BANDAGES/DRESSINGS) ×2
BOWL SMART MIX CTS (DISPOSABLE) ×2 IMPLANT
CAPT RP KNEE ×2 IMPLANT
CEMENT HV SMART SET (Cement) ×4 IMPLANT
CLOTH BEACON ORANGE TIMEOUT ST (SAFETY) ×2 IMPLANT
CO AXIAL FAN SPRAY TIP SOFT SH (MISCELLANEOUS) ×2 IMPLANT
COVER BACK TABLE 24X17X13 BIG (DRAPES) IMPLANT
COVER SURGICAL LIGHT HANDLE (MISCELLANEOUS) ×2 IMPLANT
CUFF TOURNIQUET SINGLE 34IN LL (TOURNIQUET CUFF) ×2 IMPLANT
CUFF TOURNIQUET SINGLE 44IN (TOURNIQUET CUFF) IMPLANT
DRAPE EXTREMITY T 121X128X90 (DRAPE) ×2 IMPLANT
DRAPE U-SHAPE 47X51 STRL (DRAPES) ×2 IMPLANT
DRSG PAD ABDOMINAL 8X10 ST (GAUZE/BANDAGES/DRESSINGS) ×2 IMPLANT
DURAPREP 26ML APPLICATOR (WOUND CARE) ×2 IMPLANT
ELECT REM PT RETURN 9FT ADLT (ELECTROSURGICAL) ×2
ELECTRODE REM PT RTRN 9FT ADLT (ELECTROSURGICAL) ×1 IMPLANT
EVACUATOR 1/8 PVC DRAIN (DRAIN) ×2 IMPLANT
FACESHIELD LNG OPTICON STERILE (SAFETY) ×2 IMPLANT
GAUZE XEROFORM 5X9 LF (GAUZE/BANDAGES/DRESSINGS) ×2 IMPLANT
GLOVE BIOGEL PI IND STRL 8 (GLOVE) ×3 IMPLANT
GLOVE BIOGEL PI INDICATOR 8 (GLOVE) ×3
GLOVE BIOGEL PI ORTHO PRO 7.5 (GLOVE) ×2
GLOVE ECLIPSE 7.5 STRL STRAW (GLOVE) ×6 IMPLANT
GLOVE ECLIPSE 9.0 STRL (GLOVE) ×2 IMPLANT
GLOVE PI ORTHO PRO STRL 7.5 (GLOVE) ×2 IMPLANT
GLOVE SURG SS PI 7.0 STRL IVOR (GLOVE) ×6 IMPLANT
GLOVE SURG SS PI 8.0 STRL IVOR (GLOVE) ×2 IMPLANT
GOWN PREVENTION PLUS XLARGE (GOWN DISPOSABLE) IMPLANT
GOWN SRG XL XLNG 56XLVL 4 (GOWN DISPOSABLE) ×4 IMPLANT
GOWN STRL NON-REIN LRG LVL3 (GOWN DISPOSABLE) ×2 IMPLANT
GOWN STRL NON-REIN XL XLG LVL4 (GOWN DISPOSABLE) ×4
HANDPIECE INTERPULSE COAX TIP (DISPOSABLE) ×1
HOOD PEEL AWAY FACE SHEILD DIS (HOOD) ×6 IMPLANT
IMMOBILIZER KNEE 20 (SOFTGOODS)
IMMOBILIZER KNEE 20 THIGH 36 (SOFTGOODS) IMPLANT
IMMOBILIZER KNEE 22 UNIV (SOFTGOODS) IMPLANT
IMMOBILIZER KNEE 24 THIGH 36 (MISCELLANEOUS) ×1 IMPLANT
IMMOBILIZER KNEE 24 UNIV (MISCELLANEOUS) ×2
KIT BASIN OR (CUSTOM PROCEDURE TRAY) ×2 IMPLANT
KIT ROOM TURNOVER OR (KITS) ×2 IMPLANT
MANIFOLD NEPTUNE II (INSTRUMENTS) ×2 IMPLANT
MARKER SPHERE PSV REFLC THRD 5 (MARKER) IMPLANT
NEEDLE HYPO 25GX1X1/2 BEV (NEEDLE) IMPLANT
NS IRRIG 1000ML POUR BTL (IV SOLUTION) ×2 IMPLANT
PACK TOTAL JOINT (CUSTOM PROCEDURE TRAY) ×2 IMPLANT
PAD ARMBOARD 7.5X6 YLW CONV (MISCELLANEOUS) ×4 IMPLANT
PAD CAST 4YDX4 CTTN HI CHSV (CAST SUPPLIES) ×1 IMPLANT
PADDING CAST COTTON 4X4 STRL (CAST SUPPLIES) ×1
PADDING CAST COTTON 6X4 STRL (CAST SUPPLIES) ×2 IMPLANT
PIN SCHANZ 4MM 130MM (PIN) IMPLANT
SET HNDPC FAN SPRY TIP SCT (DISPOSABLE) ×1 IMPLANT
SPONGE GAUZE 4X4 12PLY (GAUZE/BANDAGES/DRESSINGS) ×2 IMPLANT
STAPLER VISISTAT 35W (STAPLE) ×2 IMPLANT
STRIP CLOSURE SKIN 1/2X4 (GAUZE/BANDAGES/DRESSINGS) ×4 IMPLANT
SUCTION FRAZIER TIP 10 FR DISP (SUCTIONS) IMPLANT
SUT MON AB 3-0 SH 27 (SUTURE) ×1
SUT MON AB 3-0 SH27 (SUTURE) ×1 IMPLANT
SUT VIC AB 0 CTB1 27 (SUTURE) ×4 IMPLANT
SUT VIC AB 1 CT1 27 (SUTURE) ×2
SUT VIC AB 1 CT1 27XBRD ANBCTR (SUTURE) ×2 IMPLANT
SUT VIC AB 2-0 CTB1 (SUTURE) ×2 IMPLANT
SYR CONTROL 10ML LL (SYRINGE) IMPLANT
TOWEL OR 17X24 6PK STRL BLUE (TOWEL DISPOSABLE) ×2 IMPLANT
TOWEL OR 17X26 10 PK STRL BLUE (TOWEL DISPOSABLE) ×2 IMPLANT
TRAY FOLEY CATH 16FRSI W/METER (SET/KITS/TRAYS/PACK) ×2 IMPLANT
WATER STERILE IRR 1000ML POUR (IV SOLUTION) ×2 IMPLANT

## 2013-01-08 NOTE — Brief Op Note (Signed)
01/08/2013  4:11 PM  PATIENT:  Donald [name]  57 y.o. male  PRE-OPERATIVE DIAGNOSIS:  DJD LEFT KNEE   POST-OPERATIVE DIAGNOSIS:  DJD LEFT KNEE   PROCEDURE:  Procedure(s): TOTAL KNEE ARTHROPLASTY (Left)  SURGEON:  Surgeon(s) and Role:    * Harvie Junior, MD - Primary  PHYSICIAN ASSISTANT:   ASSISTANTS: bethune   ANESTHESIA:   general  EBL:  Total I/O In: 2000 [I.V.:2000] Out: 375 [Urine:275; Blood:100]  BLOOD ADMINISTERED:none  DRAINS: (1) Hemovact drain(s) in the l knee with  Suction Open   LOCAL MEDICATIONS USED:  MARCAINE    and NONE  SPECIMEN:  No Specimen  DISPOSITION OF SPECIMEN:  N/A  COUNTS:  YES  TOURNIQUET:   Total Tourniquet Time Documented: Thigh (Left) - 74 minutes Total: Thigh (Left) - 74 minutes   DICTATION: .Other Dictation: Dictation Number [id number]  PLAN OF CARE: Admit to inpatient   PATIENT DISPOSITION:  PACU - hemodynamically stable.   Delay start of Pharmacological VTE agent (>24hrs) due to surgical blood loss or risk of bleeding: no

## 2013-01-08 NOTE — Transfer of Care (Signed)
Immediate Anesthesia Transfer of Care Note  Patient: Donald [name]  Procedure(s) Performed: Procedure(s): TOTAL KNEE ARTHROPLASTY (Left)  Patient Location: PACU  Anesthesia Type:General and GA combined with regional for post-op pain  Level of Consciousness: awake, alert  and oriented  Airway & Oxygen Therapy: Patient Spontanous Breathing and Patient connected to face mask oxygen  Post-op Assessment: Report given to PACU RN and Post -op Vital signs reviewed and stable  Post vital signs: Reviewed and stable  Complications: No apparent anesthesia complications

## 2013-01-08 NOTE — H&P (Signed)
TOTAL KNEE ADMISSION H&P  Patient is being admitted for left total knee arthroplasty.  Subjective:  Chief Complaint:left knee pain.  HPI: Donald [name], 57 y.o. male, has a history of pain and functional disability in the left knee due to arthritis and has failed non-surgical conservative treatments for greater than 12 weeks to includeNSAID's and/or analgesics, corticosteriod injections, viscosupplementation injections, flexibility and strengthening excercises, supervised PT with diminished ADL's post treatment, weight reduction as appropriate and activity modification.  Onset of symptoms was gradual, starting 6 years ago with gradually worsening course since that time. The patient noted no past surgery on the left knee(s).  Patient currently rates pain in the left knee(s) at 8 out of 10 with activity. Patient has night pain, worsening of pain with activity and weight bearing, pain that interferes with activities of daily living, pain with passive range of motion and joint swelling.  Patient has evidence of subchondral sclerosis, periarticular osteophytes and joint space narrowing by imaging studies. This patient has had failure of conservative care. There is no active infection.  Patient Active Problem List   Diagnosis Date Noted  . Asthmatic bronchitis 11/04/2012  . Allergic rhinitis, seasonal 11/04/2012  . GOUT, UNSPECIFIED 05/11/2010  . INSOMNIA-SLEEP DISORDER-UNSPEC 01/09/2010  . ESSENTIAL HYPERTENSION 12/14/2009   Past Medical History  Diagnosis Date  . Hypertension   . Bronchitis     HISTORY FEW MO AGO  . Arthritis   . Diverticulitis     No past surgical history on file.  Prescriptions prior to admission  Medication Sig Dispense Refill  . loratadine (CLARITIN) 10 MG tablet Take 10 mg by mouth as needed. Allergies.      Marland Kitchen losartan (COZAAR) 100 MG tablet Take 100 mg by mouth daily.      Marland Kitchen oxyCODONE-acetaminophen (PERCOCET/ROXICET) 5-325 MG per tablet Take 1 tablet by mouth every  4 (four) hours as needed for pain.      . Pramoxine-Menthol-Dimethicone (GOLD BOND MEDICATED ANTI ITCH) 1-0.5-5 % LOTN Apply 1 application topically as needed (for itch.).      Marland Kitchen traZODone (DESYREL) 50 MG tablet Take 25-50 mg by mouth at bedtime as needed for sleep.       No Known Allergies  History  Substance Use Topics  . Smoking status: Never Smoker   . Smokeless tobacco: Never Used  . Alcohol Use: Yes     Comment: 2-3 times a week    Family History  Problem Relation Age of Onset  . Emphysema Mother   . COPD Mother   . Cancer Father      ROS  Objective:  Physical Exam  Vital signs in last 24 hours: Temp:  [97.6 F (36.4 C)] 97.6 F (36.4 C) (07/11 1044) Pulse Rate:  [74] 74 (07/11 1044) Resp:  [20] 20 (07/11 1044) BP: (151)/(96) 151/96 mmHg (07/11 1044) SpO2:  [97 %] 97 % (07/11 1044)  Labs:   Estimated body mass index is 40.62 kg/(m^2) as calculated from the following:   Height as of 01/04/13: 6\' 3"  (1.905 m).   Weight as of 11/24/12: 147.419 kg (325 lb).   Imaging Review Plain radiographs demonstrate severe degenerative joint disease of the left knee(s). The overall alignment ismild valgus. The bone quality appears to be good for age and reported activity level.  Assessment/Plan:  End stage arthritis, left knee   The patient history, physical examination, clinical judgment of the provider and imaging studies are consistent with end stage degenerative joint disease of the left knee(s) and  total knee arthroplasty is deemed medically necessary. The treatment options including medical management, injection therapy arthroscopy and arthroplasty were discussed at length. The risks and benefits of total knee arthroplasty were presented and reviewed. The risks due to aseptic loosening, infection, stiffness, patella tracking problems, thromboembolic complications and other imponderables were discussed. The patient acknowledged the explanation, agreed to proceed with the plan  and consent was signed. Patient is being admitted for inpatient treatment for surgery, pain control, PT, OT, prophylactic antibiotics, VTE prophylaxis, progressive ambulation and ADL's and discharge planning. The patient is planning to be discharged home with home health services

## 2013-01-08 NOTE — Preoperative (Signed)
Beta Blockers   Reason not to administer Beta Blockers:Not Applicable 

## 2013-01-08 NOTE — Plan of Care (Signed)
Problem: Consults Goal: Diagnosis- Total Joint Replacement Primary Total Knee Left     

## 2013-01-08 NOTE — Anesthesia Procedure Notes (Addendum)
Anesthesia Regional Block:  Femoral nerve block  Pre-Anesthetic Checklist: ,, timeout performed, Correct Patient, Correct Site, Correct Laterality, Correct Procedure,, site marked, risks and benefits discussed, Surgical consent,  Pre-op evaluation,  At surgeon's request and post-op pain management  Laterality: Left  Prep: chloraprep       Needles:  Injection technique: Single-shot  Needle Type: Echogenic Stimulator Needle     Needle Length: 9cm  Needle Gauge: 21    Additional Needles:  Procedures: nerve stimulator Femoral nerve block  Nerve Stimulator or Paresthesia:  Response: Quadriceps muscle contraction, 0.45 mA,   Additional Responses:   Narrative:  Start time: 01/08/2013 12:58 PM End time: 01/08/2013 1:09 PM Injection made incrementally with aspirations every 5 mL.  Performed by: Personally  Anesthesiologist: Dr Chaney Malling  Additional Notes: Functioning IV was confirmed and monitors were applied.  A 90mm 21ga Arrow echogenic stimulator needle was used. Sterile prep and drape,hand hygiene and sterile gloves were used.  Negative aspiration and negative test dose prior to incremental administration of local anesthetic. The patient tolerated the procedure well.    Femoral nerve block Procedure Name: Intubation Date/Time: 01/08/2013 2:22 PM Performed by: Brien Mates DOBSON Pre-anesthesia Checklist: Patient identified, Emergency Drugs available, Suction available, Patient being monitored and Timeout performed Patient Re-evaluated:Patient Re-evaluated prior to inductionOxygen Delivery Method: Circle system utilized Preoxygenation: Pre-oxygenation with 100% oxygen Intubation Type: IV induction Ventilation: Mask ventilation without difficulty and Oral airway inserted - appropriate to patient size Laryngoscope Size: Miller and 2 (attempted x 2 with miller 2. successful with glidescope attempt x 1) Grade View: Grade I Tube type: Oral Tube size: 7.5 mm Number of  attempts: 3 (DL with miller 2, VCs still engaged-did not attempt  placememnt. Withdrew, masked and  attempted after more propofol given.  Grade 2  view-attempted to place ETT but patient still coughing.  Withdrew and more profofol given.  Glidescope used with success) Airway Equipment and Method: Stylet and Video-laryngoscopy Placement Confirmation: ETT inserted through vocal cords under direct vision,  positive ETCO2 and breath sounds checked- equal and bilateral Secured at: 24 (at teeth) cm Dental Injury: Teeth and Oropharynx as per pre-operative assessment

## 2013-01-08 NOTE — Progress Notes (Signed)
Orthopedic Tech Progress Note Patient Details:  Donald [name] 09-10-1955 [id number] CPM applied to Left LE with appropriate settings. OHF applied to bed.  CPM Left Knee CPM Left Knee: On Left Knee Flexion (Degrees): 60 Left Knee Extension (Degrees): 0   [name] 01/08/2013, 5:28 PM

## 2013-01-08 NOTE — Anesthesia Preprocedure Evaluation (Addendum)
Anesthesia Evaluation  Patient identified by MRN, date of birth, ID band Patient awake    Reviewed: Allergy & Precautions, H&P , NPO status , Patient's Chart, lab work & pertinent test results, reviewed documented beta blocker date and time   Airway Mallampati: II  Neck ROM: full    Dental  (+) Teeth Intact and Dental Advisory Given   Pulmonary asthma ,          Cardiovascular hypertension,     Neuro/Psych    GI/Hepatic   Endo/Other  Morbid obesity  Renal/GU      Musculoskeletal  (+) Arthritis -,   Abdominal   Peds  Hematology   Anesthesia Other Findings   Reproductive/Obstetrics                          Anesthesia Physical Anesthesia Plan  ASA: II  Anesthesia Plan: General and Regional   Post-op Pain Management: MAC Combined w/ Regional for Post-op pain   Induction: Intravenous  Airway Management Planned: Oral ETT  Additional Equipment:   Intra-op Plan:   Post-operative Plan: Extubation in OR  Informed Consent: I have reviewed the patients History and Physical, chart, labs and discussed the procedure including the risks, benefits and alternatives for the proposed anesthesia with the patient or authorized representative who has indicated his/her understanding and acceptance.   Dental advisory given  Plan Discussed with: CRNA, Anesthesiologist and Surgeon  Anesthesia Plan Comments:        Anesthesia Quick Evaluation

## 2013-01-09 LAB — CBC
Hemoglobin: 11.9 g/dL — ABNORMAL LOW (ref 13.0–17.0)
MCH: 30.7 pg (ref 26.0–34.0)
MCHC: 34.6 g/dL (ref 30.0–36.0)
MCV: 88.7 fL (ref 78.0–100.0)
Platelets: 252 10*3/uL (ref 150–400)
RBC: 3.88 MIL/uL — ABNORMAL LOW (ref 4.22–5.81)

## 2013-01-09 LAB — BASIC METABOLIC PANEL
CO2: 27 mEq/L (ref 19–32)
Calcium: 8.9 mg/dL (ref 8.4–10.5)
Creatinine, Ser: 0.9 mg/dL (ref 0.50–1.35)
GFR calc non Af Amer: >90 mL/min (ref >90)
Glucose, Bld: 168 mg/dL — ABNORMAL HIGH (ref 70–99)

## 2013-01-09 NOTE — Progress Notes (Signed)
Physical Therapy Treatment Patient Details Name: Donald [name] MRN: [id number] DOB: 1955/10/04 Today's Date: 01/09/2013 Time: 1027-2536 PT Time Calculation (min): 39 min  PT Assessment / Plan / Recommendation  PT Comments   Patient s/p Lt. TKA.  Patient making good progress with mobility/gait.  Follow Up Recommendations  Home health PT;Supervision/Assistance - 24 hour     Does the patient have the potential to tolerate intense rehabilitation     Barriers to Discharge        Equipment Recommendations  None recommended by PT    Recommendations for Other Services    Frequency 7X/week   Progress towards PT Goals Progress towards PT goals: Progressing toward goals  Plan Current plan remains appropriate    Precautions / Restrictions Precautions Precautions: Knee Required Braces or Orthoses: Knee Immobilizer - Left Knee Immobilizer - Left: On when out of bed or walking;Discontinue once straight leg raise with < 10 degree lag Restrictions Weight Bearing Restrictions: Yes LLE Weight Bearing: Weight bearing as tolerated   Pertinent Vitals/Pain     Mobility  Bed Mobility Bed Mobility: Sit to Supine Sit to Supine: 4: Min assist;HOB flat Details for Bed Mobility Assistance: Verbal cues for technique.  Assist to raise LLE onto bed. Transfers Transfers: Sit to Stand;Stand to Sit Sit to Stand: 4: Min guard;With upper extremity assist;With armrests;From chair/3-in-1 Stand to Sit: 4: Min guard;With upper extremity assist;To bed Details for Transfer Assistance: Verbal cues for hand placement. Ambulation/Gait Ambulation/Gait Assistance: 4: Min assist Ambulation Distance (Feet): 150 Feet Assistive device: Rolling walker Ambulation/Gait Assistance Details: Verbal cues for safe placement of RW and for step length.  Patient taking long steps, too far into RW. Gait Pattern: Step-through pattern;Decreased stance time - left;Decreased step length - right;Antalgic;Trunk flexed Gait  velocity: Slow gait speed General Gait Details: Patient to bathroom to void.  Instructed in safe use of RW at toilet.    Exercises Total Joint Exercises Ankle Circles/Pumps: AROM;Both;10 reps;Supine Quad Sets: AROM;Left;10 reps;Supine Towel Squeeze: AROM;Both;10 reps;Supine Heel Slides: AAROM;Left;10 reps;Supine Hip ABduction/ADduction: AROM;Left;10 reps;Supine     PT Goals (current goals can now be found in the care plan section)    Visit Information  Last PT Received On: 01/09/13 Assistance Needed: +1 History of Present Illness: Patient is a 57 yo male s/p Lt TKA.    Subjective Data  Subjective: "It's not hurting too bad"   Cognition  Cognition Arousal/Alertness: Awake/alert Behavior During Therapy: WFL for tasks assessed/performed Overall Cognitive Status: Within Functional Limits for tasks assessed    Balance     End of Session PT - End of Session Equipment Utilized During Treatment: Gait belt;Left knee immobilizer Activity Tolerance: Patient tolerated treatment well Patient left: in bed;in CPM;with call bell/phone within reach;with family/visitor present (CPM on at 16:15 and increased to 65 degrees flexion) Nurse Communication: Mobility status   GP     Vena Austria 01/09/2013, 4:43 PM Durenda Hurt. Renaldo Fiddler, [facility] Acute Rehab Services Pager [phone]

## 2013-01-09 NOTE — Evaluation (Signed)
Physical Therapy Evaluation Patient Details Name: JAKARIE PEMBER MRN: [id number] DOB: Mar 18, 1956 Today's Date: 01/09/2013 Time: 4098-1191 PT Time Calculation (min): 31 min  PT Assessment / Plan / Recommendation History of Present Illness  Patient is a 57 yo male s/p Lt TKA.  Clinical Impression  Patient presents with problems listed below.  Will benefit from acute PT to maximize independence prior to return home.    PT Assessment  Patient needs continued PT services    Follow Up Recommendations  Home health PT;Supervision/Assistance - 24 hour    Does the patient have the potential to tolerate intense rehabilitation      Barriers to Discharge        Equipment Recommendations  None recommended by PT    Recommendations for Other Services     Frequency 7X/week    Precautions / Restrictions Precautions Precautions: Knee Precaution Booklet Issued: Yes (comment) Precaution Comments: Reviewed precautions with patient. Required Braces or Orthoses: Knee Immobilizer - Left Knee Immobilizer - Left: On when out of bed or walking;Discontinue once straight leg raise with < 10 degree lag Restrictions Weight Bearing Restrictions: Yes LLE Weight Bearing: Weight bearing as tolerated   Pertinent Vitals/Pain       Mobility  Bed Mobility Bed Mobility: Supine to Sit;Sitting - Scoot to Edge of Bed Supine to Sit: 4: Min assist;With rails Sitting - Scoot to [place] of Bed: 4: Min guard Details for Bed Mobility Assistance: Instructed patient on donning KI on LLE.  Verbal cues for technique.  Assist to move LLE off of bed. Transfers Transfers: Sit to Stand;Stand to Sit Sit to Stand: 4: Min assist;With upper extremity assist;From bed Stand to Sit: 4: Min assist;With upper extremity assist;With armrests;To chair/3-in-1 Details for Transfer Assistance: Verbal cues for hand placement and placement of LLE during transfers.  Assist to rise to standing. Ambulation/Gait Ambulation/Gait Assistance:  4: Min assist Ambulation Distance (Feet): 24 Feet Assistive device: Rolling walker Ambulation/Gait Assistance Details: Verbal cues for safe use of RW and gait sequence.  Cues to stand upright and look forward during gait. Gait Pattern: Step-to pattern;Decreased stance time - left;Decreased step length - right;Antalgic;Trunk flexed Gait velocity: Slow gait speed    Exercises Total Joint Exercises Ankle Circles/Pumps: AROM;Both;10 reps;Seated Quad Sets: AROM;Left;10 reps;Seated Towel Squeeze: AROM;Both;10 reps;Seated Hip ABduction/ADduction: AROM;Left;10 reps;Seated   PT Diagnosis: Difficulty walking;Acute pain  PT Problem List: Decreased strength;Decreased range of motion;Decreased activity tolerance;Decreased balance;Decreased mobility;Decreased knowledge of use of DME;Decreased knowledge of precautions;Obesity;Pain PT Treatment Interventions: DME instruction;Gait training;Stair training;Functional mobility training;Therapeutic exercise;Patient/family education     PT Goals(Current goals can be found in the care plan section) Acute Rehab PT Goals Patient Stated Goal: To return home PT Goal Formulation: With patient Time For Goal Achievement: 01/16/13 Potential to Achieve Goals: Good  Visit Information  Last PT Received On: 01/09/13 Assistance Needed: +1 History of Present Illness: Patient is a 57 yo male s/p Lt TKA.       Prior Functioning  Home Living Family/patient expects to be discharged to:: Private residence Living Arrangements: Non-relatives/Friends Available Help at Discharge: Friend(s);Available 24 hours/day (for 1 week) Type of Home: House Home Access: Stairs to enter [employer] of Steps: 1 Entrance Stairs-Rails: None Home Layout: Two level;Able to live on main level with bedroom/bathroom Home Equipment: Dan Humphreys - 2 wheels;Bedside commode Prior Function Level of Independence: Independent Comments: Works as Production designer, theatre/television/film:  No difficulties    Cognition  Cognition Arousal/Alertness: Awake/alert Behavior During Therapy: WFL for tasks assessed/performed Overall Cognitive  Status: Within Functional Limits for tasks assessed    Extremity/Trunk Assessment Upper Extremity Assessment Upper Extremity Assessment: Overall WFL for tasks assessed Lower Extremity Assessment Lower Extremity Assessment: LLE deficits/detail LLE Deficits / Details: Able to assist moving LLE off of bed. LLE: Unable to fully assess due to pain   Balance    End of Session PT - End of Session Equipment Utilized During Treatment: Gait belt;Left knee immobilizer Activity Tolerance: Patient limited by pain Patient left: in chair;with call bell/phone within reach Nurse Communication: Mobility status CPM Left Knee CPM Left Knee: Off  GP     Vena Austria 01/09/2013, 12:32 PM Durenda Hurt. Renaldo Fiddler, [facility] Acute Rehab Services Pager [phone]

## 2013-01-09 NOTE — Op Note (Signed)
NAME:  JOHNPAUL, GILLENTINE NO.:  [id number]  MEDICAL RECORD NO.:  [id number]  LOCATION:  [id number]                        FACILITY:  [facility]  PHYSICIAN:  Harvie Junior, M.D.   DATE OF BIRTH:  30-Jan-1956  DATE OF PROCEDURE:  01/08/2013 DATE OF DISCHARGE:                              OPERATIVE REPORT   PREOPERATIVE DIAGNOSES: 1. End-stage degenerative joint disease of the left knee. 2. Tight lateral retinaculum.  POSTOPERATIVE DIAGNOSES: 1. End-stage degenerative joint disease of the left knee. 2. Tight lateral retinaculum.  PROCEDURE: 1. Left total knee replacement with a Sigma system, size 5 femur, size     5 tibia, 12.5 mm bridging bearing, and a 35-mm all polyethylene     patella. 2. Lateral retinacular release.  SURGEON:  Harvie Junior, M.D.  ASSISTANT:  Marshia Ly, P.A.  ANESTHESIA:  General.  BRIEF HISTORY:  Mr. Carranza is a 57 year old male with a long history of having significant complaints of left knee pain.  He was treated conservatively for a prolonged period of time and after failure of all conservative care, he was taken to the operating room for left total knee replacement.  Preoperative x-ray showed he had significant bone-on- bone changes in the lateral compartment as well as patellofemoral compartment.  After failure of all conservative care, he was taken to the operating room for this procedure because of continued complaints of night pain and light activity pain.  PROCEDURE:  The patient was taken to the operating room.  After adequate anesthesia was obtained with general anesthesia, the patient was placed supine on the operating table.  Left leg was prepped and draped in usual sterile fashion.  Following this, the leg was exsanguinated.  Blood pressure tourniquet inflated to 350 mmHg.  Following this, a midline incision was made in the subcutaneous tissue down the level of the extensor mechanism and medial parapatellar arthrotomy was  undertaken. Once this was done, medial and lateral menisci were removed, retropatellar fat pad, synovium in the anterior aspect of the femur, and anterior posterior cruciates.  Once this was done, the tibia was exposed and it was cut perpendicular to its long axis with an extramedullary guide.  Once this was done, an intramedullary pilot hole was made in the femur, and then had placement of a guide and 10 mm of distal femur was resected at this point.  Once this was done, attention was turned towards putting a spacer block, a 10 spacer block easily went into place, 12.5 was tight, but felt this is probably where we were going to get to.  At this point, attention was turned to the femur, sized to a 5, and then anterior and posterior cuts were made, chamfers and box. Attention was then turned to the tibia, sized to a 5, was drilled and keeled.  Following this, trial components were put in place, the 10 poly.  Attention was then turned to the patella, which was sized to a 35, was drilled and a trial poly patella was placed and the knee put through a range of motion.  Excellent stability and range of motion was achieved.  At this point, all trial components were removed.  At  this point, as we are flexing the knee the patella was tracking dramatically laterally just would not hold in the groove at all, even with thumb pressure would not hold in the groove.  At that point, we felt that lateral retinacular release was important and lateral retinacular release was performed from the joint line up to the muscular portion of the quad.  Once this was done, the knee was copiously and thoroughly lavaged and suctioned dry.  The trial components were then removed.  The knee with pulsatile lavage irrigation was irrigated and suctioned dry. Once it was dried, cement was mixed.  Final components were opened and the components were cemented in place, size 5 tibia, size 5 femur, 10 mm bridging bearing trial  was placed and a 35-mm all polyethylene patella was placed and held with a clamp.  All cement was allowed to harden. Once it had hardened, all excess bone cement was removed with cement tools and once the cement was allowed to harden, the tourniquet was let down.  I took a 12.5 out trial and actually thought it was where we might get to, but this had a very nice fit at this point, and achieve easily full extension and had little bit better tension in the drawer. At this point, we went with a 12.5 poly.  All bleeding was controlled with electrocautery.  Knee was copiously and thoroughly lavaged and suctioned dry.  Tourniquet had been let down at this point, and at this point, the final poly was placed and the medial parapatellar arthrotomy was closed with 1-Vicryl running, skin with 0 and 2-0 Vicryl, and 3-0 Monocryl subcuticular.  Benzoin and Steri-Strips were applied.  Sterile compressive dressing was applied, and the patient was taken to the recovery room and was noted to be in a satisfactory condition. Estimated blood loss for the procedure was minimal.     Harvie Junior, M.D.     Ranae Plumber  D:  01/08/2013  T:  01/09/2013  Job:  [id number]

## 2013-01-09 NOTE — Progress Notes (Signed)
Subjective: 1 Day Post-Op Procedure(s) (LRB): TOTAL KNEE ARTHROPLASTY (Left)  Activity level:  Can be weightbearing as tolerated Diet tolerance:  Eating Voiding:  Voiding Patient reports pain as 3 on 0-10 scale.    Objective: Vital signs in last 24 hours: Temp:  [97.1 F (36.2 C)-98.8 F (37.1 C)] 97.6 F (36.4 C) (07/12 0544) Pulse Rate:  [58-100] 74 (07/12 0544) Resp:  [10-20] 16 (07/12 0544) BP: (123-186)/(69-98) 137/77 mmHg (07/12 0544) SpO2:  [97 %-100 %] 97 % (07/12 0544)  Labs:  Recent Labs  01/09/13 0450  HGB 11.9*    Recent Labs  01/09/13 0450  WBC 12.0*  RBC 3.88*  HCT 34.4*  PLT 252    Recent Labs  01/09/13 0450  NA 138  K 4.3  CL 102  CO2 27  BUN 10  CREATININE 0.90  GLUCOSE 168*  CALCIUM 8.9   No results found for this basename: LABPT, INR,  in the last 72 hours  Physical Exam:  Neurologically intact ABD soft Neurovascular intact Sensation intact distally Intact pulses distally Dorsiflexion/Plantar flexion intact No cellulitis present Compartment soft Left knee drain pulled today no problem.  Assessment/Plan:  1 Day Post-Op Procedure(s) (LRB): TOTAL KNEE ARTHROPLASTY (Left) Advance diet Up with therapy D/C IV fluids Plan for discharge tomorrow if. passes therapy Continue ASA 325 twice a day/SCDs for DVT prophylaxis Drain pulled and new dressing will be applied later today. Hopeful for discharge Sunday.    Donald [name] R 01/09/2013, 8:06 AM

## 2013-01-10 LAB — CBC
MCH: 30.6 pg (ref 26.0–34.0)
MCV: 89.9 fL (ref 78.0–100.0)
Platelets: 244 10*3/uL (ref 150–400)
RDW: 13.6 % (ref 11.5–15.5)

## 2013-01-10 MED ORDER — ASPIRIN 325 MG PO TBEC: 325.0000 mg | DELAYED_RELEASE_TABLET | Freq: Two times a day (BID) | ORAL | Status: DC

## 2013-01-10 MED ORDER — OXYCODONE-ACETAMINOPHEN 5-325 MG PO TABS: 2.0000 | ORAL_TABLET | ORAL | Status: DC | PRN

## 2013-01-10 MED ORDER — METHOCARBAMOL 500 MG PO TABS: 500.0000 mg | ORAL_TABLET | Freq: Four times a day (QID) | ORAL | Status: DC | PRN

## 2013-01-10 NOTE — Progress Notes (Signed)
Subjective: 2 Days Post-Op Procedure(s) (LRB): TOTAL KNEE ARTHROPLASTY (Left)  Activity level:  wbat Diet tolerance:  ok Voiding:  ok Patient reports pain as 2 on 0-10 scale.    Objective: Vital signs in last 24 hours: Temp:  [97.5 F (36.4 C)-97.9 F (36.6 C)] 97.7 F (36.5 C) (07/13 0448) Pulse Rate:  [70-85] 70 (07/13 0448) Resp:  [18] 18 (07/13 0448) BP: (134-140)/(70-87) 134/70 mmHg (07/13 0448) SpO2:  [95 %-98 %] 98 % (07/13 0448)  Labs:  Recent Labs  01/09/13 0450 01/10/13 0602  HGB 11.9* 11.5*    Recent Labs  01/09/13 0450 01/10/13 0602  WBC 12.0* 15.4*  RBC 3.88* 3.76*  HCT 34.4* 33.8*  PLT 252 244    Recent Labs  01/09/13 0450  NA 138  K 4.3  CL 102  CO2 27  BUN 10  CREATININE 0.90  GLUCOSE 168*  CALCIUM 8.9   No results found for this basename: LABPT, INR,  in the last 72 hours  Physical Exam:  Neurologically intact ABD soft Neurovascular intact Sensation intact distally Intact pulses distally Dorsiflexion/Plantar flexion intact Incision: dressing C/D/I No cellulitis present Compartment soft  Assessment/Plan:  2 Days Post-Op Procedure(s) (LRB): TOTAL KNEE ARTHROPLASTY (Left) Advance diet Up with therapy Discharge home with home health ASA bid Percocet Robaxin rto 2 weeks    Donald [name] R 01/10/2013, 11:19 AM

## 2013-01-10 NOTE — Progress Notes (Signed)
Physical Therapy Treatment Patient Details Name: Donald [name] MRN: [id number] DOB: 1956-05-21 Today's Date: 01/10/2013 Time: 9562-1308 PT Time Calculation (min): 38 min  PT Assessment / Plan / Recommendation  PT Comments   Patient s/p Lt. TKA.  Patient is at supervision level with mobility and gait, ambulating 250'.  Patient did well on stairs today, completing with min guard assist.  Patient ready for discharge from PT perspective with f/u HHPT.  Follow Up Recommendations  Home health PT;Supervision/Assistance - 24 hour     Does the patient have the potential to tolerate intense rehabilitation     Barriers to Discharge        Equipment Recommendations  None recommended by PT    Recommendations for Other Services    Frequency 7X/week   Progress towards PT Goals Progress towards PT goals: Progressing toward goals  Plan Current plan remains appropriate    Precautions / Restrictions Precautions Precautions: Knee Required Braces or Orthoses: Knee Immobilizer - Left Knee Immobilizer - Left: On when out of bed or walking;Discontinue once straight leg raise with < 10 degree lag Restrictions Weight Bearing Restrictions: Yes LLE Weight Bearing: Weight bearing as tolerated   Pertinent Vitals/Pain     Mobility  Bed Mobility Bed Mobility: Supine to Sit;Sitting - Scoot to Edge of Bed Supine to Sit: 5: Supervision;HOB flat Sitting - Scoot to Edge of Bed: 5: Supervision Details for Bed Mobility Assistance: Instructed patient on donning KI LLE.  No cues or assist needed with mobility. Transfers Transfers: Sit to Stand;Stand to Sit Sit to Stand: 5: Supervision;With upper extremity assist;From bed;With armrests;From chair/3-in-1 Stand to Sit: 5: Supervision;With upper extremity assist;With armrests;To chair/3-in-1 Details for Transfer Assistance: Patient with proper hand placement and good technique. Ambulation/Gait Ambulation/Gait Assistance: 5: Supervision Ambulation Distance  (Feet): 250 Feet Assistive device: Rolling walker Ambulation/Gait Assistance Details: Patient with good gait pattern and safety with RW. Gait Pattern: Step-through pattern;Trunk flexed Stairs: Yes Stairs Assistance: 4: Min guard Stairs Assistance Details (indicate cue type and reason): Instructed patient on navigating 1 step forward with use of RW.  Practiced up/down x2.  Then instructed patient on stairs with 1 rail, step-to pattern going forward. Stair Management Technique: One rail Right;Step to pattern;Forwards (Also, 1 step forward with RW (curb)) Number of Stairs: 3    Exercises Total Joint Exercises Ankle Circles/Pumps: AROM;Both;10 reps;Seated Quad Sets: AROM;Left;10 reps;Seated Heel Slides: AROM;Left;10 reps;Seated Hip ABduction/ADduction: AROM;Left;10 reps;Seated Long Arc Quad: AROM;Left;10 reps;Seated Knee Flexion: AROM;Left;10 reps;Seated;AAROM (Completed assisted with RLE asssiting LLE) Goniometric ROM: Flexion 80 degrees; -10 degrees extension   PT Diagnosis:    PT Problem List:   PT Treatment Interventions:     PT Goals (current goals can now be found in the care plan section)    Visit Information  Last PT Received On: 01/10/13 Assistance Needed: +1 History of Present Illness: Patient is a 57 yo male s/p Lt TKA.    Subjective Data  Subjective: "I've made lots of progress"   Cognition  Cognition Arousal/Alertness: Awake/alert Behavior During Therapy: WFL for tasks assessed/performed Overall Cognitive Status: Within Functional Limits for tasks assessed    Balance     End of Session PT - End of Session Equipment Utilized During Treatment: Gait belt;Left knee immobilizer Activity Tolerance: Patient tolerated treatment well Patient left: in chair;with call bell/phone within reach Nurse Communication: Mobility status;Patient requests pain meds   GP     Vena Austria 01/10/2013, 9:18 AM Donald [name]. Donald [name], [facility] Acute Rehab Services Pager [phone]

## 2013-01-10 NOTE — Discharge Summary (Signed)
Patient ID: Donald [name] MRN: [id number] DOB/AGE: August 15, 1955 57 y.o.  Admit date: 01/08/2013 Discharge date: 01/10/2013  Admission Diagnoses:  Principal Problem:   Osteoarthritis of left knee   Discharge Diagnoses:  Same  Past Medical History  Diagnosis Date  . Hypertension   . Bronchitis     HISTORY FEW MO AGO  . Arthritis   . Diverticulitis     Surgeries: Procedure(s): TOTAL KNEE ARTHROPLASTY on 01/08/2013   Consultants:    Discharged Condition: Improved  Hospital Course: Donald [name] is an 57 y.o. male who was admitted 01/08/2013 for operative treatment ofOsteoarthritis of left knee. Patient has severe unremitting pain that affects sleep, daily activities, and work/hobbies. After pre-op clearance the patient was taken to the operating room on 01/08/2013 and underwent  Procedure(s): TOTAL KNEE ARTHROPLASTY.    Patient was given perioperative antibiotics: Anti-infectives   Start     Dose/Rate Route Frequency Ordered Stop   01/08/13 2000  ceFAZolin (ANCEF) IVPB 2 g/50 mL premix     2 g 100 mL/hr over 30 Minutes Intravenous Every 6 hours 01/08/13 1837 01/09/13 0327   01/08/13 0600  ceFAZolin (ANCEF) 3 g in dextrose 5 % 50 mL IVPB     3 g 160 mL/hr over 30 Minutes Intravenous On call to O.R. 01/07/13 1428 01/08/13 1423       Patient was given sequential compression devices, early ambulation, and chemoprophylaxis to prevent DVT.  Patient benefited maximally from hospital stay and there were no complications.    Recent vital signs: Patient Vitals for the past 24 hrs:  BP Temp Temp src Pulse Resp SpO2  01/10/13 0448 134/70 mmHg 97.7 F (36.5 C) - 70 18 98 %  01/09/13 2130 140/76 mmHg 97.9 F (36.6 C) - 85 18 96 %  01/09/13 1500 138/87 mmHg 97.5 F (36.4 C) Oral 75 18 95 %     Recent laboratory studies:  Recent Labs  01/09/13 0450 01/10/13 0602  WBC 12.0* 15.4*  HGB 11.9* 11.5*  HCT 34.4* 33.8*  PLT 252 244  NA 138  --   K 4.3  --   CL 102  --   CO2  27  --   BUN 10  --   CREATININE 0.90  --   GLUCOSE 168*  --   CALCIUM 8.9  --      Discharge Medications:     Medication List         aspirin 325 MG EC tablet  Take 1 tablet (325 mg total) by mouth 2 (two) times daily after a meal.     GOLD BOND MEDICATED ANTI ITCH 1-0.5-5 % Lotn  Generic drug:  Pramoxine-Menthol-Dimethicone  Apply 1 application topically as needed (for itch.).     loratadine 10 MG tablet  Commonly known as:  CLARITIN  Take 10 mg by mouth as needed. Allergies.     losartan 100 MG tablet  Commonly known as:  COZAAR  Take 100 mg by mouth daily.     methocarbamol 500 MG tablet  Commonly known as:  ROBAXIN  Take 1 tablet (500 mg total) by mouth every 6 (six) hours as needed.     oxyCODONE-acetaminophen 5-325 MG per tablet  Commonly known as:  PERCOCET/ROXICET  Take 2 tablets by mouth every 4 (four) hours as needed for pain.     traZODone 50 MG tablet  Commonly known as:  DESYREL  Take 25-50 mg by mouth at bedtime as needed for sleep.  Diagnostic Studies: Dg Chest 2 View  01/04/2013   *RADIOLOGY REPORT*  Clinical Data: Preop for left knee replacement.  Hypertension. Bronchitis.  CHEST - 2 VIEW  Comparison: None.  Findings: Midline trachea.  Normal heart size.  Mildly tortuous thoracic aorta. No pleural effusion or pneumothorax.  Clear lungs.  IMPRESSION: No acute cardiopulmonary disease.   Original Report Authenticated By: Jeronimo Greaves, M.D.    Disposition: 01-Home or Self Care      Discharge Orders   Future Orders Complete By Expires     Call MD / Call 911  As directed     Comments:      If you experience chest pain or shortness of breath, CALL 911 and be transported to the hospital emergency room.  If you develope a fever above 101 F, pus (white drainage) or increased drainage or redness at the wound, or calf pain, call your surgeon's office.    Constipation Prevention  As directed     Comments:      Drink plenty of fluids.  Prune juice may  be helpful.  You may use a stool softener, such as Colace (over the counter) 100 mg twice a day.  Use MiraLax (over the counter) for constipation as needed.    Diet - low sodium heart healthy  As directed     Increase activity slowly as tolerated  As directed        Follow-up Information   Follow up with GRAVES,JOHN L, MD. Schedule an appointment as soon as possible for a visit in 10 days.   Contact information:   [address] [place] [address] [phone]        Signed: Prince Rome 01/10/2013, 11:22 AM

## 2013-01-11 NOTE — Care Management Note (Signed)
    Page 1 of 1   01/11/2013     11:59:21 AM   CARE MANAGEMENT NOTE 01/11/2013  Patient:  CLAIR, ALFIERI   Account Number:  [id number]  Date Initiated:  01/11/2013  Documentation initiated by:  Jacquelynn Cree  Subjective/Objective Assessment:   admitted postop rt total knee arthroplasty     Action/Plan:   Anticipated DC Date:     Anticipated DC Plan:           Choice offered to / List presented to:             Status of service:  Completed, signed off Medicare Important Message given?   (If response is "NO", the following Medicare IM given date fields will be blank) Date Medicare IM given:   Date Additional Medicare IM given:    Discharge Disposition:  HOME W HOME HEALTH SERVICES  Per UR Regulation:    If discussed at Long Length of Stay Meetings, dates discussed:    Comments:  01/11/13 Patient was set up with [facility] for HHPT by MD office. Jacquelynn Cree RN, BSN, CCM

## 2013-01-11 NOTE — Anesthesia Postprocedure Evaluation (Signed)
Anesthesia Post Note  Patient: Donald [name]  Procedure(s) Performed: Procedure(s) (LRB): TOTAL KNEE ARTHROPLASTY (Left)  Anesthesia type: General  Patient location: PACU  Post pain: Pain level controlled and Adequate analgesia  Post assessment: Post-op Vital signs reviewed, Patient's Cardiovascular Status Stable, Respiratory Function Stable, Patent Airway and Pain level controlled  Last Vitals:  Filed Vitals:   01/10/13 0448  BP: 134/70  Pulse: 70  Temp: 36.5 C  Resp: 18    Post vital signs: Reviewed and stable  Level of consciousness: awake, alert  and oriented  Complications: No apparent anesthesia complications

## 2013-01-12 ENCOUNTER — Encounter (HOSPITAL_COMMUNITY): Payer: Self-pay | Admitting: Orthopedic Surgery

## 2013-01-19 ENCOUNTER — Other Ambulatory Visit (HOSPITAL_COMMUNITY): Payer: Self-pay | Admitting: Orthopedic Surgery

## 2013-01-19 ENCOUNTER — Ambulatory Visit (HOSPITAL_COMMUNITY)
Admission: RE | Admit: 2013-01-19 | Discharge: 2013-01-19 | Disposition: A | Discharge status: Home / Self Care | Payer: BC Managed Care – PPO | Source: Ambulatory Visit | Attending: Orthopedic Surgery | Admitting: Orthopedic Surgery

## 2013-01-19 DIAGNOSIS — Z96659 Presence of unspecified artificial knee joint: Secondary | ICD-10-CM | POA: Insufficient documentation

## 2013-01-19 DIAGNOSIS — M7989 Other specified soft tissue disorders: Secondary | ICD-10-CM

## 2013-01-19 NOTE — Progress Notes (Signed)
*  PRELIMINARY RESULTS* Vascular Ultrasound Left lower extremity venous duplex has been completed.  Preliminary findings: negative for DVT.  Called report to Henriette Combs, PA.    Farrel Demark, RDMS, RVT  01/19/2013, 4:49 PM

## 2013-01-20 ENCOUNTER — Other Ambulatory Visit (HOSPITAL_COMMUNITY): Payer: Self-pay | Admitting: Vascular Surgery

## 2013-02-05 ENCOUNTER — Ambulatory Visit: Payer: BC Managed Care – PPO | Admitting: Family Medicine

## 2013-02-13 ENCOUNTER — Other Ambulatory Visit: Payer: Self-pay | Admitting: Internal Medicine

## 2013-04-07 ENCOUNTER — Inpatient Hospital Stay (HOSPITAL_COMMUNITY)
Admission: EM | Admit: 2013-04-07 | Discharge: 2013-04-10 | DRG: 551 | Disposition: A | Discharge status: Home / Self Care | Payer: BC Managed Care – PPO | Attending: Internal Medicine | Admitting: Internal Medicine

## 2013-04-07 ENCOUNTER — Emergency Department (HOSPITAL_COMMUNITY): Payer: BC Managed Care – PPO

## 2013-04-07 ENCOUNTER — Encounter (HOSPITAL_COMMUNITY): Payer: Self-pay | Admitting: Emergency Medicine

## 2013-04-07 DIAGNOSIS — Z96659 Presence of unspecified artificial knee joint: Secondary | ICD-10-CM

## 2013-04-07 DIAGNOSIS — K921 Melena: Secondary | ICD-10-CM | POA: Diagnosis present

## 2013-04-07 DIAGNOSIS — K5790 Diverticulosis of intestine, part unspecified, without perforation or abscess without bleeding: Secondary | ICD-10-CM

## 2013-04-07 DIAGNOSIS — E669 Obesity, unspecified: Secondary | ICD-10-CM | POA: Diagnosis present

## 2013-04-07 DIAGNOSIS — K5730 Diverticulosis of large intestine without perforation or abscess without bleeding: Secondary | ICD-10-CM

## 2013-04-07 DIAGNOSIS — R109 Unspecified abdominal pain: Secondary | ICD-10-CM | POA: Diagnosis present

## 2013-04-07 DIAGNOSIS — R52 Pain, unspecified: Secondary | ICD-10-CM | POA: Diagnosis present

## 2013-04-07 DIAGNOSIS — K5792 Diverticulitis of intestine, part unspecified, without perforation or abscess without bleeding: Secondary | ICD-10-CM

## 2013-04-07 DIAGNOSIS — E43 Unspecified severe protein-calorie malnutrition: Secondary | ICD-10-CM | POA: Insufficient documentation

## 2013-04-07 DIAGNOSIS — M109 Gout, unspecified: Secondary | ICD-10-CM

## 2013-04-07 DIAGNOSIS — K529 Noninfective gastroenteritis and colitis, unspecified: Secondary | ICD-10-CM | POA: Diagnosis present

## 2013-04-07 DIAGNOSIS — Z6837 Body mass index (BMI) 37.0-37.9, adult: Secondary | ICD-10-CM

## 2013-04-07 DIAGNOSIS — D72829 Elevated white blood cell count, unspecified: Secondary | ICD-10-CM | POA: Diagnosis present

## 2013-04-07 DIAGNOSIS — I10 Essential (primary) hypertension: Secondary | ICD-10-CM | POA: Diagnosis present

## 2013-04-07 HISTORY — DX: Diverticulosis of intestine, part unspecified, without perforation or abscess without bleeding: K57.90

## 2013-04-07 LAB — ABO/RH: ABO/RH(D): B POS

## 2013-04-07 LAB — CBC WITH DIFFERENTIAL/PLATELET
Basophils Absolute: 0 10*3/uL (ref 0.0–0.1)
Lymphs Abs: 1.1 10*3/uL (ref 0.7–4.0)
MCH: 31.2 pg (ref 26.0–34.0)
MCHC: 35.9 g/dL (ref 30.0–36.0)
MCV: 86.9 fL (ref 78.0–100.0)
Monocytes Absolute: 0.9 10*3/uL (ref 0.1–1.0)
Neutro Abs: 12.2 10*3/uL — ABNORMAL HIGH (ref 1.7–7.7)
Platelets: 265 10*3/uL (ref 150–400)
RDW: 12.3 % (ref 11.5–15.5)
WBC Morphology: INCREASED
WBC: 14.2 10*3/uL — ABNORMAL HIGH (ref 4.0–10.5)

## 2013-04-07 LAB — TYPE AND SCREEN: ABO/RH(D): B POS

## 2013-04-07 LAB — HEMOGLOBIN AND HEMATOCRIT, BLOOD
HCT: 43.9 % (ref 39.0–52.0)
Hemoglobin: 15.7 g/dL (ref 13.0–17.0)

## 2013-04-07 LAB — COMPREHENSIVE METABOLIC PANEL
Albumin: 4.3 g/dL (ref 3.5–5.2)
BUN: 12 mg/dL (ref 6–23)
Creatinine, Ser: 0.8 mg/dL (ref 0.50–1.35)
GFR calc Af Amer: >90 mL/min (ref >90)
Glucose, Bld: 115 mg/dL — ABNORMAL HIGH (ref 70–99)
Total Bilirubin: 0.5 mg/dL (ref 0.3–1.2)
Total Protein: 7.8 g/dL (ref 6.0–8.3)

## 2013-04-07 LAB — LACTIC ACID, PLASMA: Lactic Acid, Venous: 1.6 mmol/L (ref 0.5–2.2)

## 2013-04-07 MED ORDER — ONDANSETRON HCL 4 MG PO TABS: 4.0000 mg | ORAL_TABLET | Freq: Four times a day (QID) | ORAL | Status: DC | PRN

## 2013-04-07 MED ORDER — SODIUM CHLORIDE 0.9 % IV SOLN
INTRAVENOUS | Status: AC
  Administered 2013-04-08 (×2): via INTRAVENOUS

## 2013-04-07 MED ORDER — MORPHINE SULFATE 4 MG/ML IJ SOLN
4.0000 mg | Freq: Once | INTRAMUSCULAR | Status: AC
  Administered 2013-04-07: 4 mg via INTRAVENOUS
  Filled 2013-04-07: qty 1

## 2013-04-07 MED ORDER — AMITRIPTYLINE HCL 50 MG PO TABS
50.0000 mg | ORAL_TABLET | Freq: Every day | ORAL | Status: DC
  Administered 2013-04-08 – 2013-04-09 (×2): 50 mg via ORAL
  Filled 2013-04-07 (×5): qty 1

## 2013-04-07 MED ORDER — SODIUM CHLORIDE 0.9 % IV SOLN
Freq: Once | INTRAVENOUS | Status: AC
  Administered 2013-04-07: 20:00:00 via INTRAVENOUS

## 2013-04-07 MED ORDER — ONDANSETRON HCL 4 MG/2ML IJ SOLN: 4.0000 mg | Freq: Three times a day (TID) | INTRAMUSCULAR | Status: AC | PRN

## 2013-04-07 MED ORDER — HYDROMORPHONE HCL PF 1 MG/ML IJ SOLN: 1.0000 mg | INTRAMUSCULAR | Status: DC | PRN

## 2013-04-07 MED ORDER — SODIUM CHLORIDE 0.9 % IV SOLN: INTRAVENOUS | Status: AC

## 2013-04-07 MED ORDER — SODIUM CHLORIDE 0.9 % IJ SOLN
3.0000 mL | Freq: Two times a day (BID) | INTRAMUSCULAR | Status: DC
  Administered 2013-04-09: 21:00:00 3 mL via INTRAVENOUS

## 2013-04-07 MED ORDER — METRONIDAZOLE IN NACL 5-0.79 MG/ML-% IV SOLN
500.0000 mg | Freq: Three times a day (TID) | INTRAVENOUS | Status: DC
  Administered 2013-04-08 – 2013-04-10 (×7): 500 mg via INTRAVENOUS
  Filled 2013-04-07 (×9): qty 100

## 2013-04-07 MED ORDER — CIPROFLOXACIN IN D5W 400 MG/200ML IV SOLN
400.0000 mg | Freq: Two times a day (BID) | INTRAVENOUS | Status: DC
  Administered 2013-04-08 – 2013-04-10 (×5): 400 mg via INTRAVENOUS
  Filled 2013-04-07 (×7): qty 200

## 2013-04-07 MED ORDER — TRAZODONE HCL 50 MG PO TABS
50.0000 mg | ORAL_TABLET | Freq: Two times a day (BID) | ORAL | Status: DC | PRN
  Filled 2013-04-07: qty 1

## 2013-04-07 MED ORDER — ONDANSETRON HCL 4 MG/2ML IJ SOLN: 4.0000 mg | Freq: Four times a day (QID) | INTRAMUSCULAR | Status: DC | PRN

## 2013-04-07 MED ORDER — HYDROMORPHONE HCL PF 1 MG/ML IJ SOLN: 1.0000 mg | INTRAMUSCULAR | Status: AC | PRN

## 2013-04-07 MED ORDER — IOHEXOL 300 MG/ML  SOLN
100.0000 mL | Freq: Once | INTRAMUSCULAR | Status: AC | PRN
  Administered 2013-04-07: 100 mL via INTRAVENOUS

## 2013-04-07 MED ORDER — CIPROFLOXACIN IN D5W 400 MG/200ML IV SOLN
400.0000 mg | Freq: Two times a day (BID) | INTRAVENOUS | Status: DC
  Filled 2013-04-07: qty 200

## 2013-04-07 MED ORDER — IOHEXOL 300 MG/ML  SOLN
50.0000 mL | Freq: Once | INTRAMUSCULAR | Status: AC | PRN
  Administered 2013-04-07: 50 mL via ORAL

## 2013-04-07 MED ORDER — SODIUM CHLORIDE 0.9 % IV BOLUS (SEPSIS)
1000.0000 mL | Freq: Once | INTRAVENOUS | Status: AC
  Administered 2013-04-07: 1000 mL via INTRAVENOUS

## 2013-04-07 MED ORDER — CIPROFLOXACIN IN D5W 400 MG/200ML IV SOLN
400.0000 mg | Freq: Once | INTRAVENOUS | Status: AC
  Administered 2013-04-07: 400 mg via INTRAVENOUS
  Filled 2013-04-07: qty 200

## 2013-04-07 MED ORDER — METRONIDAZOLE IN NACL 5-0.79 MG/ML-% IV SOLN
500.0000 mg | Freq: Once | INTRAVENOUS | Status: AC
  Administered 2013-04-07: 500 mg via INTRAVENOUS
  Filled 2013-04-07: qty 100

## 2013-04-07 MED ORDER — ZOLPIDEM TARTRATE 10 MG PO TABS
10.0000 mg | ORAL_TABLET | Freq: Every evening | ORAL | Status: DC | PRN
  Administered 2013-04-08 – 2013-04-09 (×2): 10 mg via ORAL
  Filled 2013-04-07 (×2): qty 1

## 2013-04-07 NOTE — ED Provider Notes (Signed)
CSN: [id number]     Arrival date & time 04/07/13  1431 History   First MD Initiated Contact with Patient 04/07/13 1459     Chief Complaint  Patient presents with  . Rectal Bleeding   (Consider location/radiation/quality/duration/timing/severity/associated sxs/prior Treatment) Patient is a 57 y.o. male presenting with hematochezia.  Rectal Bleeding Quality:  Bright red Amount:  Moderate Duration:  11 hours Timing:  Intermittent Progression:  Unchanged Chronicity:  New Context: defecation and spontaneously   Similar prior episodes: no   Relieved by:  Nothing Worsened by:  Nothing tried Ineffective treatments:  None tried Associated symptoms: abdominal pain   Associated symptoms: no dizziness, no epistaxis, no fever, no hematemesis, no light-headedness, no loss of consciousness, no recent illness and no vomiting   Abdominal pain:    Pain location: upper abodminal.   Quality:  Dull   Severity:  Mild   Onset quality:  Sudden   Duration:  11 hours   Timing:  Constant   Progression:  Unchanged   Chronicity:  New Risk factors: NSAID use   Risk factors comment:  Ibuprofen & Meloxicam   Past Medical History  Diagnosis Date  . Hypertension   . Bronchitis     HISTORY FEW MO AGO  . Arthritis   . Diverticulitis    Past Surgical History  Procedure Laterality Date  . Total knee arthroplasty Left 01/08/2013    Procedure: TOTAL KNEE ARTHROPLASTY;  Surgeon: Harvie Junior, MD;  Location: [facility] OR;  Service: Orthopedics;  Laterality: Left;   Family History  Problem Relation Age of Onset  . Emphysema Mother   . COPD Mother   . Cancer Father    History  Substance Use Topics  . Smoking status: Never Smoker   . Smokeless tobacco: Never Used  . Alcohol Use: Yes     Comment: 2-3 times a week    Review of Systems  Constitutional: Negative for fever, activity change, appetite change and fatigue.  HENT: Negative for congestion, facial swelling, nosebleeds, rhinorrhea and trouble  swallowing.   Eyes: Negative for photophobia and pain.  Respiratory: Negative for cough, chest tightness and shortness of breath.   Cardiovascular: Negative for chest pain and leg swelling.  Gastrointestinal: Positive for abdominal pain, blood in stool and hematochezia. Negative for nausea, vomiting, diarrhea, constipation and hematemesis.  Endocrine: Negative for polydipsia and polyuria.  Genitourinary: Negative for dysuria, urgency, decreased urine volume and difficulty urinating.  Musculoskeletal: Negative for back pain and gait problem.  Skin: Negative for color change, rash and wound.  Allergic/Immunologic: Negative for immunocompromised state.  Neurological: Negative for dizziness, loss of consciousness, facial asymmetry, speech difficulty, weakness, light-headedness, numbness and headaches.  Psychiatric/Behavioral: Negative for confusion, decreased concentration and agitation.    Allergies  Review of patient's allergies indicates no known allergies.  Home Medications   No current outpatient prescriptions on file. BP 188/87  Pulse 81  Temp(Src) 98.8 F (37.1 C) (Oral)  Resp 16  Ht 6\' 3"  (1.905 m)  Wt 303 lb 12.7 oz (137.8 kg)  BMI 37.97 kg/m2  SpO2 98% Physical Exam  Constitutional: He is oriented to person, place, and time. He appears well-developed and well-nourished. No distress.  HENT:  Head: Normocephalic and atraumatic.  Mouth/Throat: No oropharyngeal exudate.  Eyes: Pupils are equal, round, and reactive to light.  Neck: Normal range of motion. Neck supple.  Cardiovascular: Normal rate, regular rhythm and normal heart sounds.  Exam reveals no gallop and no friction rub.   No murmur heard.  Pulmonary/Chest: Effort normal and breath sounds normal. No respiratory distress. He has no wheezes. He has no rales.  Abdominal: Soft. Bowel sounds are normal. He exhibits no distension and no mass. There is no tenderness. There is no rebound and no guarding.  Genitourinary:  Guaiac positive stool.  Musculoskeletal: Normal range of motion. He exhibits no edema and no tenderness.  Neurological: He is alert and oriented to person, place, and time.  Skin: Skin is warm and dry.  Psychiatric: He has a normal mood and affect.    ED Course  Procedures (including critical care time) Labs Review Labs Reviewed  CBC WITH DIFFERENTIAL - Abnormal; Notable for the following:    WBC 14.2 (*)    Neutrophils Relative % 86 (*)    Lymphocytes Relative 8 (*)    Neutro Abs 12.2 (*)    All other components within normal limits  COMPREHENSIVE METABOLIC PANEL - Abnormal; Notable for the following:    Glucose, Bld 115 (*)    All other components within normal limits  OCCULT BLOOD, POC DEVICE - Abnormal; Notable for the following:    Fecal Occult Bld POSITIVE (*)    All other components within normal limits  CLOSTRIDIUM DIFFICILE BY PCR  STOOL CULTURE  MRSA PCR SCREENING  LACTIC ACID, PLASMA  HEMOGLOBIN AND HEMATOCRIT, BLOOD  HEMOGLOBIN AND HEMATOCRIT, BLOOD  HEMOGLOBIN AND HEMATOCRIT, BLOOD  HEMOGLOBIN AND HEMATOCRIT, BLOOD  HEMOGLOBIN AND HEMATOCRIT, BLOOD  TYPE AND SCREEN  ABO/RH   Imaging Review Ct Abdomen Pelvis W Contrast  04/07/2013   CLINICAL DATA:  Rectal bleeding. History of diverticulitis.  EXAM: CT ABDOMEN AND PELVIS WITH CONTRAST  TECHNIQUE: Multidetector CT imaging of the abdomen and pelvis was performed using the standard protocol following bolus administration of intravenous contrast.  CONTRAST:  OMNIPAQUE IOHEXOL 300 MG/ML  SOLN  COMPARISON:  None.  FINDINGS: There is significant long segment wall thickening of the colon beginning in the distal transverse colon and extending in a continuous fashion to the distal sigmoid colon. There is extensive diverticulosis of the sigmoid and descending colon. There is stranding in the adjacent fat. No extraluminal bowel gas to suggest perforation. No evidence of abscess formation. No disproportionate dilatation of  the colon. Mild stool burden in the ascending and proximal transverse colon.  Minimal atherosclerotic changes of the vasculature are noted.  The liver, gallbladder, spleen, pancreas, adrenal glands, and right kidney are within normal limits. There is a minimally complicated cysts which is lobulated from the lower pole of the left kidney.  No abnormal adenopathy. Trace free fluid in the pelvis.  Bladder is within normal limits. Unremarkable prostate gland.  There is advanced degenerative disc disease at L5-S1 with vacuum. No vertebral compression deformity. Severe degenerative change of the right hip joint. Moderate degenerative change of the left hip joint. Degenerative changes in the right SI joint. No destructive bone lesion. Calcification in the left iliopsoas distal tendon is noted.  IMPRESSION: Findings are most consistent with an acute inflammatory process involving the distal transverse colon, descending colon, and sigmoid colon related to acute diverticulitis. Because a long segment is involved, 1 would have to consider ischemia, pseudomembranous colitis, and infectious colitis.   Electronically Signed   By: Maryclare Bean M.D.   On: 04/07/2013 18:55    MDM   1. Diverticulitis   2. Colitis   3. Abdominal pain, acute   4. Diverticulosis   5. Hematochezia   6. Unspecified essential hypertension    Pt is a  57 y.o. male with Pmhx as above who presents with mild upper abdominal pain and 5-6 episodes of BRBPR since around 4am.  Pt denies n/v, fever, urinary symptoms. BRBPR described as BRB mixed with stool.  On PT, pt tachycardic, but non-toxic appearing. Stool as described by pt on rectal exam. Abdominal exam benign with only minimal ttp. Pt does admit to taking some ibuprofen along w/ new rx of meloxicam since recent total knee replacement.   5:11 PM W/U thus far shows elevated WBC count, stable Hb.  Pt has had one bloody BM in dept.  Given WBC elevation, continued symptoms, have ordered CT.  7:38PM  CT shows diverticulitis, possible infectiious vs pseudomembranous colitis.  Cl diff added.  Cipro, flagyl will be given.  [facility] will admit.       Shanna Cisco, MD 04/08/13 [phone]

## 2013-04-07 NOTE — Progress Notes (Signed)
Utilization Review completed.  Petronella Shuford RN CM  

## 2013-04-07 NOTE — ED Notes (Signed)
Pt c/o rectal bleeding and upper abdominal pain starting at 0400.  Pain score 3/10.

## 2013-04-07 NOTE — H&P (Signed)
PCP:   Judie Petit, MD   Chief Complaint:  abd pain, dairrhea with blood  HPI: 57 yo male healthy comes in with sudden onset of bloody diarrhea and abd pain since 4am today.  Has had over 6 bloody stools, abd pain more in upper abd, no n/v.  No fevers.  Has diverticulosis no previous history of colitis or diverticulitis.  No recent abx.  He did have total knee replacement on the left about 3 months ago, developed a rash on the wound from the tape and was given something at that time but not sure if it was abx or not.  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  Has had unevently recovery from knee surgery otherwise.  Review of Systems:  Positive and negative as per HPI otherwise all other systems are negative  Past Medical History: Past Medical History  Diagnosis Date  . Hypertension   . Bronchitis     HISTORY FEW MO AGO  . Arthritis   . Diverticulitis    Past Surgical History  Procedure Laterality Date  . Total knee arthroplasty Left 01/08/2013    Procedure: TOTAL KNEE ARTHROPLASTY;  Surgeon: Harvie Junior, MD;  Location: [facility] OR;  Service: Orthopedics;  Laterality: Left;    Medications: Prior to Admission medications   Medication Sig Start Date End Date Taking? Authorizing Provider  amitriptyline (ELAVIL) 50 MG tablet Take 50 mg by mouth at bedtime.   Yes Historical Provider, MD  HYDROcodone-acetaminophen (NORCO) 10-325 MG per tablet Take 1 tablet by mouth every 6 (six) hours as needed for pain.   Yes Historical Provider, MD  loratadine (CLARITIN) 10 MG tablet Take 10 mg by mouth as needed for allergies. Allergies.   Yes Historical Provider, MD  losartan (COZAAR) 100 MG tablet Take 100 mg by mouth daily.   Yes Historical Provider, MD  meloxicam (MOBIC) 15 MG tablet Take 15 mg by mouth daily.   Yes Historical Provider, MD  traZODone (DESYREL) 50 MG tablet Take 50 mg by mouth 3 times/day as needed-between meals & bedtime for sleep.   Yes Historical Provider, MD  zolpidem (AMBIEN) 10 MG tablet Take  10 mg by mouth at bedtime as needed for sleep.   Yes Historical Provider, MD    Allergies:  No Known Allergies  Social History:  reports that he has never smoked. He has never used smokeless tobacco. He reports that he drinks alcohol. He reports that he does not use illicit drugs.  Family History: Family History  Problem Relation Age of Onset  . Emphysema Mother   . COPD Mother   . Cancer Father     Physical Exam: Filed Vitals:   04/07/13 1437 04/07/13 1509 04/07/13 1619  BP: 143/112 166/96 154/116  Pulse: 117 94 81  Temp: 98.4 F (36.9 C)    TempSrc: Oral    Resp: 20 16 16   SpO2: 96% 97% 98%   General appearance: alert, cooperative and no distress Head: Normocephalic, without obvious abnormality, atraumatic Eyes: negative Nose: Nares normal. Septum midline. Mucosa normal. No drainage or sinus tenderness. Neck: no JVD and supple, symmetrical, trachea midline Lungs: clear to auscultation bilaterally Heart: regular rate and rhythm, S1, S2 normal, no murmur, click, rub or gallop Abdomen: soft, non-tender; bowel sounds normal; no masses,  no organomegaly Extremities: extremities normal, atraumatic, no cyanosis or edema Pulses: 2+ and symmetric Skin: Skin color, texture, turgor normal. No rashes or lesions Neurologic: Grossly normal    Labs on Admission:   Recent Labs  04/07/13 1512  NA 136  K 4.0  CL 100  CO2 22  GLUCOSE 115*  BUN 12  CREATININE 0.80  CALCIUM 9.5    Recent Labs  04/07/13 1512  AST 20  ALT 22  ALKPHOS 69  BILITOT 0.5  PROT 7.8  ALBUMIN 4.3    Recent Labs  04/07/13 1512  WBC 14.2*  NEUTROABS 12.2*  HGB 16.7  HCT 46.5  MCV 86.9  PLT 265    Radiological Exams on Admission: Ct Abdomen Pelvis W Contrast  04/07/2013   CLINICAL DATA:  Rectal bleeding. History of diverticulitis.  EXAM: CT ABDOMEN AND PELVIS WITH CONTRAST  TECHNIQUE: Multidetector CT imaging of the abdomen and pelvis was performed using the standard protocol  following bolus administration of intravenous contrast.  CONTRAST:  OMNIPAQUE IOHEXOL 300 MG/ML  SOLN  COMPARISON:  None.  FINDINGS: There is significant long segment wall thickening of the colon beginning in the distal transverse colon and extending in a continuous fashion to the distal sigmoid colon. There is extensive diverticulosis of the sigmoid and descending colon. There is stranding in the adjacent fat. No extraluminal bowel gas to suggest perforation. No evidence of abscess formation. No disproportionate dilatation of the colon. Mild stool burden in the ascending and proximal transverse colon.  Minimal atherosclerotic changes of the vasculature are noted.  The liver, gallbladder, spleen, pancreas, adrenal glands, and right kidney are within normal limits. There is a minimally complicated cysts which is lobulated from the lower pole of the left kidney.  No abnormal adenopathy. Trace free fluid in the pelvis.  Bladder is within normal limits. Unremarkable prostate gland.  There is advanced degenerative disc disease at L5-S1 with vacuum. No vertebral compression deformity. Severe degenerative change of the right hip joint. Moderate degenerative change of the left hip joint. Degenerative changes in the right SI joint. No destructive bone lesion. Calcification in the left iliopsoas distal tendon is noted.  IMPRESSION: Findings are most consistent with an acute inflammatory process involving the distal transverse colon, descending colon, and sigmoid colon related to acute diverticulitis. Because a long segment is involved, 1 would have to consider ischemia, pseudomembranous colitis, and infectious colitis.   Electronically Signed   By: Maryclare Bean M.D.   On: 04/07/2013 18:55    Assessment/Plan  58 yo male with abd pain, hemetechezia and acute diverticulitis/colitis Principal Problem:   Colitis presumed to be due to infection Active Problems:   ESSENTIAL HYPERTENSION   Diverticulosis    Hematochezia   Abdominal pain, acute  Stool cx, cdiff.  Iv cipro and flagyl.  abd exam is benign.  Clear liq diet.  Serial h/h, hgb good now.  Has had 4 bm bloody in ED.  Admit to tele bed.  Vss.  Full code.  Aris Moman A 04/07/2013, 8:51 PM

## 2013-04-08 DIAGNOSIS — E43 Unspecified severe protein-calorie malnutrition: Secondary | ICD-10-CM | POA: Insufficient documentation

## 2013-04-08 DIAGNOSIS — K5289 Other specified noninfective gastroenteritis and colitis: Secondary | ICD-10-CM

## 2013-04-08 DIAGNOSIS — R109 Unspecified abdominal pain: Secondary | ICD-10-CM

## 2013-04-08 DIAGNOSIS — R52 Pain, unspecified: Secondary | ICD-10-CM

## 2013-04-08 LAB — HEMOGLOBIN AND HEMATOCRIT, BLOOD
HCT: 42.4 % (ref 39.0–52.0)
Hemoglobin: 14.9 g/dL (ref 13.0–17.0)

## 2013-04-08 LAB — CBC
HCT: 41.6 % (ref 39.0–52.0)
MCH: 30.7 pg (ref 26.0–34.0)
MCV: 87.4 fL (ref 78.0–100.0)
Platelets: 298 10*3/uL (ref 150–400)
Platelets: 288 10*3/uL (ref 150–400)
RBC: 4.92 MIL/uL (ref 4.22–5.81)
RBC: 4.73 MIL/uL (ref 4.22–5.81)
RDW: 12.7 % (ref 11.5–15.5)
WBC: 16.8 10*3/uL — ABNORMAL HIGH (ref 4.0–10.5)

## 2013-04-08 LAB — BASIC METABOLIC PANEL
BUN: 9 mg/dL (ref 6–23)
Calcium: 9 mg/dL (ref 8.4–10.5)
Chloride: 102 mEq/L (ref 96–112)
GFR calc Af Amer: >90 mL/min (ref >90)
GFR calc non Af Amer: >90 mL/min (ref >90)
Glucose, Bld: 117 mg/dL — ABNORMAL HIGH (ref 70–99)
Potassium: 3.9 mEq/L (ref 3.5–5.1)
Sodium: 136 mEq/L (ref 135–145)

## 2013-04-08 MED ORDER — LOSARTAN POTASSIUM 50 MG PO TABS
100.0000 mg | ORAL_TABLET | Freq: Every day | ORAL | Status: DC
  Administered 2013-04-08 – 2013-04-10 (×3): 100 mg via ORAL
  Filled 2013-04-08 (×3): qty 2

## 2013-04-08 MED ORDER — HYDRALAZINE HCL 20 MG/ML IJ SOLN
10.0000 mg | Freq: Four times a day (QID) | INTRAMUSCULAR | Status: DC | PRN
  Administered 2013-04-08 – 2013-04-09 (×3): 10 mg via INTRAVENOUS
  Filled 2013-04-08 (×3): qty 1

## 2013-04-08 MED ORDER — INFLUENZA VAC SPLIT QUAD 0.5 ML IM SUSP
0.5000 mL | INTRAMUSCULAR | Status: AC
  Administered 2013-04-09: 0.5 mL via INTRAMUSCULAR
  Filled 2013-04-08 (×2): qty 0.5

## 2013-04-08 MED ORDER — BIOTENE DRY MOUTH MT LIQD
15.0000 mL | Freq: Two times a day (BID) | OROMUCOSAL | Status: DC
  Administered 2013-04-09: 15 mL via OROMUCOSAL

## 2013-04-08 MED ORDER — CHLORHEXIDINE GLUCONATE 0.12 % MT SOLN
15.0000 mL | Freq: Two times a day (BID) | OROMUCOSAL | Status: DC
  Administered 2013-04-08 – 2013-04-10 (×4): 15 mL via OROMUCOSAL
  Filled 2013-04-08 (×7): qty 15

## 2013-04-08 MED ORDER — SODIUM CHLORIDE 0.9 % IV SOLN
INTRAVENOUS | Status: DC
  Administered 2013-04-09 (×2): via INTRAVENOUS

## 2013-04-08 MED ORDER — TRAMADOL HCL 50 MG PO TABS
50.0000 mg | ORAL_TABLET | Freq: Four times a day (QID) | ORAL | Status: DC | PRN
  Administered 2013-04-08: 50 mg via ORAL
  Filled 2013-04-08: qty 1

## 2013-04-08 NOTE — Progress Notes (Signed)
INITIAL NUTRITION ASSESSMENT  Pt meets criteria for severe MALNUTRITION in the context of chronic illness as evidenced by <75% estimated energy intake with 9% weight loss in the past 3 months.  DOCUMENTATION CODES Per approved criteria  -Severe malnutrition in the context of chronic illness -Obesity   INTERVENTION: - Diet advancement per MD - Educated pt on low fiber diet for diverticulitis/colitis  - Will continue to monitor   NUTRITION DIAGNOSIS: Inadequate oral intake related to clear liquid diet as evidenced by diet order.   Goal: Advance diet as tolerated to low fiber diet  Monitor:  Weights, labs, diet advancement, diarrhea  Reason for Assessment: Nutrition risk   57 y.o. male  Admitting Dx: Colitis presumed to be due to infection  ASSESSMENT: Pt admitted with abdominal pain and diarrhea with blood. Had over 6 bloody stools yesterday. Had total left knee placement 3 months ago. Found to have colitis.   Met with pt who reports 30 pound unintended weight loss in the past 3 months r/t poor appetite from surgery. States he tries to eat 2 meals/day. Had not yet ordered clear liquid diet.   Height: Ht Readings from Last 1 Encounters:  04/07/13 6\' 3"  (1.905 m)    Weight: Wt Readings from Last 1 Encounters:  04/07/13 303 lb 12.7 oz (137.8 kg)    Ideal Body Weight: 196 lb  % Ideal Body Weight: 154%  Wt Readings from Last 10 Encounters:  04/07/13 303 lb 12.7 oz (137.8 kg)  01/04/13 321 lb 6.4 oz (145.786 kg)  11/24/12 325 lb (147.419 kg)  12/25/11 325 lb (147.419 kg)  04/02/10 335 lb (151.955 kg)  03/23/09 325 lb (147.419 kg)    Usual Body Weight: 333 lb in July 2014 per pt report  % Usual Body Weight: 91%  BMI:  Body mass index is 37.97 kg/(m^2). Class II obesity  Estimated Nutritional Needs: Kcal: 2300-2500 Protein: 135-155g Fluid: 2.3-2.5L/day  Skin: Intact   Diet Order: Clear Liquid  EDUCATION NEEDS: -Education needs addressed - briefly  discussed low fiber diet for colitis and diverticulitis. Pt expressed understanding.    Intake/Output Summary (Last 24 hours) at 04/08/13 1130 Last data filed at 04/08/13 0600  Gross per 24 hour  Intake 471.25 ml  Output    700 ml  Net -228.75 ml    Last BM: 10/9  Labs:   Recent Labs Lab 04/07/13 1512 04/08/13 0340  NA 136 136  K 4.0 3.9  CL 100 102  CO2 22 22  BUN 12 9  CREATININE 0.80 0.74  CALCIUM 9.5 9.0  GLUCOSE 115* 117*    CBG (last 3)  No results found for this basename: GLUCAP,  in the last 72 hours  Scheduled Meds: . sodium chloride   Intravenous STAT  . amitriptyline  50 mg Oral QHS  . antiseptic oral rinse  15 mL Mouth Rinse q12n4p  . chlorhexidine  15 mL Mouth Rinse BID  . ciprofloxacin  400 mg Intravenous Q12H  . [START ON 04/09/2013] influenza vac split quadrivalent PF  0.5 mL Intramuscular Tomorrow-1000  . losartan  100 mg Oral Daily  . metronidazole  500 mg Intravenous Q8H  . sodium chloride  3 mL Intravenous Q12H    Continuous Infusions:   Past Medical History  Diagnosis Date  . Hypertension   . Bronchitis     HISTORY FEW MO AGO  . Arthritis   . Diverticulitis     Past Surgical History  Procedure Laterality Date  . Total knee  arthroplasty Left 01/08/2013    Procedure: TOTAL KNEE ARTHROPLASTY;  Surgeon: Harvie Junior, MD;  Location: [facility] OR;  Service: Orthopedics;  Laterality: Left;    Levon Hedger MS, RD, LDN [phone] Pager [phone] After Hours Pager

## 2013-04-08 NOTE — Progress Notes (Addendum)
Report given to Jess, RN to assume care of this patient.  Earnest Conroy. Clelia Croft, RN

## 2013-04-08 NOTE — Consult Note (Signed)
Referring Provider: No ref. provider found Primary Care Physician:  Judie Petit, MD Primary Gastroenterologist:  Dr. Marina Goodell  Reason for Consultation:  Colitis; bloody diarrhea  HPI: Donald [name] is a 57 y.o. male with no other significant health problems came to [facility] on 10/8 with complaints of sudden onset of bloody diarrhea and abd pain since 4am on the day of admission.  He woke up suddenly at 4 AM with abdominal cramps and urge to move his bowels.  Had over 6 bloody stools and decided to come to the ED for evaluation. No fevers, nausea or vomiting. Has diverticulosis in the left colon as seen on colonoscopy in 05/2010 by Dr. Marina Goodell.  Denies previous history of colitis or diverticulitis along with any other similar symptoms in the past. No recent abx. He did have total knee replacement on the left about 3 months ago and developed a rash on the wound from the tape; he was given something at that time but not sure if it was abx or not. 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  No recent travel.  Has had unevently recovery from knee surgery otherwise.  In the ED he had a leukocytosis of 14.2.  CT scan of the abdomen and pelvis with contrast showed the following:  IMPRESSION:  Findings are most consistent with an acute inflammatory process  involving the distal transverse colon, descending colon, and sigmoid  colon related to acute diverticulitis. Because a long segment is  involved, 1 would have to consider ischemia, pseudomembranous  colitis, and infectious colitis.  Cdiff was negative and preliminary stool culture is negative.  He is receiving empiric cipro and flagyl.  Says that he is feeling a little better.  Now with just mild lower abdominal discomfort but still passing only blood when moving his bowels.  Hgb is stable.   Past Medical History  Diagnosis Date  . Hypertension   . Bronchitis     HISTORY FEW MO AGO  . Arthritis   . Diverticulitis     Past Surgical History  Procedure Laterality Date   . Total knee arthroplasty Left 01/08/2013    Procedure: TOTAL KNEE ARTHROPLASTY;  Surgeon: Harvie Junior, MD;  Location: [facility] OR;  Service: Orthopedics;  Laterality: Left;    Prior to Admission medications   Medication Sig Start Date End Date Taking? Authorizing Provider  amitriptyline (ELAVIL) 50 MG tablet Take 50 mg by mouth at bedtime.   Yes Historical Provider, MD  HYDROcodone-acetaminophen (NORCO) 10-325 MG per tablet Take 1 tablet by mouth every 6 (six) hours as needed for pain.   Yes Historical Provider, MD  loratadine (CLARITIN) 10 MG tablet Take 10 mg by mouth as needed for allergies. Allergies.   Yes Historical Provider, MD  losartan (COZAAR) 100 MG tablet Take 100 mg by mouth daily.   Yes Historical Provider, MD  meloxicam (MOBIC) 15 MG tablet Take 15 mg by mouth daily.   Yes Historical Provider, MD  traZODone (DESYREL) 50 MG tablet Take 50 mg by mouth 3 times/day as needed-between meals & bedtime for sleep.   Yes Historical Provider, MD  zolpidem (AMBIEN) 10 MG tablet Take 10 mg by mouth at bedtime as needed for sleep.   Yes Historical Provider, MD    Current Facility-Administered Medications  Medication Dose Route Frequency Provider Last Rate Last Dose  . amitriptyline (ELAVIL) tablet 50 mg  50 mg Oral QHS Tarry Kos, MD      . antiseptic oral rinse (BIOTENE) solution 15 mL  15 mL Mouth  Rinse q12n4p Tarry Kos, MD      . chlorhexidine (PERIDEX) 0.12 % solution 15 mL  15 mL Mouth Rinse BID Tarry Kos, MD   15 mL at 04/08/13 0927  . ciprofloxacin (CIPRO) IVPB 400 mg  400 mg Intravenous Q12H Tarry Kos, MD   400 mg at 04/08/13 0927  . hydrALAZINE (APRESOLINE) injection 10 mg  10 mg Intravenous Q6H PRN Leda Gauze, NP   10 mg at 04/08/13 0535  . HYDROmorphone (DILAUDID) injection 1 mg  1 mg Intravenous Q4H PRN Tarry Kos, MD      . Melene Muller ON 04/09/2013] influenza vac split quadrivalent PF (FLUARIX) injection 0.5 mL  0.5 mL Intramuscular Tomorrow-1000 Tarry Kos, MD       . losartan (COZAAR) tablet 100 mg  100 mg Oral Daily Leda Gauze, NP   100 mg at 04/08/13 4098  . metroNIDAZOLE (FLAGYL) IVPB 500 mg  500 mg Intravenous Q8H Tarry Kos, MD   500 mg at 04/08/13 1225  . ondansetron (ZOFRAN) tablet 4 mg  4 mg Oral Q6H PRN Tarry Kos, MD       Or  . ondansetron (ZOFRAN) injection 4 mg  4 mg Intravenous Q6H PRN Tarry Kos, MD      . sodium chloride 0.9 % injection 3 mL  3 mL Intravenous Q12H Tarry Kos, MD      . traZODone (DESYREL) tablet 50 mg  50 mg Oral BID BM & HS PRN Tarry Kos, MD      . zolpidem (AMBIEN) tablet 10 mg  10 mg Oral QHS PRN Tarry Kos, MD        Allergies as of 04/07/2013  . (No Known Allergies)    Family History  Problem Relation Age of Onset  . Emphysema Mother   . COPD Mother   . Cancer Father     History   Social History  . Marital Status: Single    Spouse Name: N/A    Number of Children: N/A  . Years of Education: N/A   Occupational History  . Not on file.   Social History Main Topics  . Smoking status: Never Smoker   . Smokeless tobacco: Never Used  . Alcohol Use: Yes     Comment: 2-3 times a week  . Drug Use: No  . Sexual Activity: Not on file   Other Topics Concern  . Not on file   Social History Narrative  . No narrative on file    Review of Systems: Ten point ROS is O/W negative except as mentioned on HPI.  Physical Exam: Vital signs in last 24 hours: Temp:  [98.4 F (36.9 C)-98.8 F (37.1 C)] 98.6 F (37 C) (10/09 0451) Pulse Rate:  [81-117] 92 (10/09 0451) Resp:  [16-20] 16 (10/09 0451) BP: (143-188)/(87-116) 184/93 mmHg (10/09 0619) SpO2:  [96 %-98 %] 96 % (10/09 0451) Weight:  [303 lb 12.7 oz (137.8 kg)] 303 lb 12.7 oz (137.8 kg) (10/08 2200) Last BM Date: 04/08/13 General:   Alert, Well-developed, well-nourished, pleasant and cooperative in NAD Head:  Normocephalic and atraumatic. Eyes:  Sclera clear, no icterus.  Conjunctiva pink. Ears:  Normal auditory  acuity. Mouth:  No deformity or lesions.   Lungs:  Clear throughout to auscultation.  No wheezes, crackles, or rhonchi. Heart:  Regular rate and rhythm; no murmurs, clicks, rubs, or gallops. Abdomen:  Soft, non-distended.  BS present.  Mild LLQ TTP without R/R/G. Rectal:  Deferred  Msk:  Symmetrical without gross deformities.  Scar on left knee from recent TKR. Pulses:  Normal pulses noted. Extremities:  Without clubbing or edema. Neurologic:  Alert and  oriented x4;  grossly normal neurologically. Skin:  Intact without significant lesions or rashes.. Psych:  Alert and cooperative. Normal mood and affect.  Intake/Output from previous day: 10/08 0701 - 10/09 0700 In: 471.3 [I.V.:371.3; IV Piggyback:100] Out: 700 [Urine:700] Intake/Output this shift: Total I/O In: 580 [P.O.:580] Out: -   Lab Results:  Recent Labs  04/07/13 1512 04/07/13 2229 04/08/13 0340  WBC 14.2*  --  15.0*  HGB 16.7 15.7 14.9  15.1  HCT 46.5 43.9 42.4  43.0  PLT 265  --  298   BMET  Recent Labs  04/07/13 1512 04/08/13 0340  NA 136 136  K 4.0 3.9  CL 100 102  CO2 22 22  GLUCOSE 115* 117*  BUN 12 9  CREATININE 0.80 0.74  CALCIUM 9.5 9.0   LFT  Recent Labs  04/07/13 1512  PROT 7.8  ALBUMIN 4.3  AST 20  ALT 22  ALKPHOS 69  BILITOT 0.5   Studies/Results: Ct Abdomen Pelvis W Contrast  04/07/2013   CLINICAL DATA:  Rectal bleeding. History of diverticulitis.  EXAM: CT ABDOMEN AND PELVIS WITH CONTRAST  TECHNIQUE: Multidetector CT imaging of the abdomen and pelvis was performed using the standard protocol following bolus administration of intravenous contrast.  CONTRAST:  OMNIPAQUE IOHEXOL 300 MG/ML  SOLN  COMPARISON:  None.  FINDINGS: There is significant long segment wall thickening of the colon beginning in the distal transverse colon and extending in a continuous fashion to the distal sigmoid colon. There is extensive diverticulosis of the sigmoid and descending colon. There is  stranding in the adjacent fat. No extraluminal bowel gas to suggest perforation. No evidence of abscess formation. No disproportionate dilatation of the colon. Mild stool burden in the ascending and proximal transverse colon.  Minimal atherosclerotic changes of the vasculature are noted.  The liver, gallbladder, spleen, pancreas, adrenal glands, and right kidney are within normal limits. There is a minimally complicated cysts which is lobulated from the lower pole of the left kidney.  No abnormal adenopathy. Trace free fluid in the pelvis.  Bladder is within normal limits. Unremarkable prostate gland.  There is advanced degenerative disc disease at L5-S1 with vacuum. No vertebral compression deformity. Severe degenerative change of the right hip joint. Moderate degenerative change of the left hip joint. Degenerative changes in the right SI joint. No destructive bone lesion. Calcification in the left iliopsoas distal tendon is noted.  IMPRESSION: Findings are most consistent with an acute inflammatory process involving the distal transverse colon, descending colon, and sigmoid colon related to acute diverticulitis. Because a long segment is involved, 1 would have to consider ischemia, pseudomembranous colitis, and infectious colitis.   Electronically Signed   By: Maryclare Bean M.D.   On: 04/07/2013 18:55    IMPRESSION:  -Acute onset of bloody diarrhea and abdominal pain with colitis seen on CT scan:  Differential includes infectious vs ischemic etiologies.   -Leukocytosis secondary to the above.   PLAN: -Agree with empiric treatment with cipro and flagyl. -Cdiff and stool culture negative, but will check GI pathogen panel and place on enteric precautions for now. -Monitor Hgb. -Continue supportive care.   Sachit Gilman D.  04/08/2013, 1:14 PM  Pager number [phone]  Pt seen and examined.  X-rays were reviewed.  Suspect infectious colitis as probable etiology of acute colitis.  Ischemic colitis much less  likely,  in the absence of underlying vascular disease.  Doubt idiopathic colitis.  Agree with empiric Rx with cipro/flagyl.  He should be placed on enteric precautions.  Await stool studies.  Check stool pathogen panel.

## 2013-04-08 NOTE — Progress Notes (Signed)
[facility] PROGRESS NOTE  Donald [name] DGL:[id number] DOB: [birth year] DOA: 04/07/2013 PCP: Judie Petit, MD  Assessment/Plan: 1. Colitis, likely ischemic vs infectious -h/o colonoscopy 3 years ago with diverticulosis -supportive care -clears, IVF, CIpro/Flagyl -r/o Cdiff -[facility] consulted  2. HTN Not controlled -continue losartan, hydralazine PRN  DVt proph: SCDs  Code Status: Full Family Communication: none at bedside Disposition Plan: home when improved   Consultants:  [facility]  Antibiotics:  Cipro/flagyl 10/8  HPI/Subjective: Feels louse, some abdominal discomfort, intermittent lower abd pain and bleeding  Objective: Filed Vitals:   04/08/13 0619  BP: 184/93  Pulse:   Temp:   Resp:     Intake/Output Summary (Last 24 hours) at 04/08/13 1341 Last data filed at 04/08/13 1200  Gross per 24 hour  Intake 1051.25 ml  Output    700 ml  Net 351.25 ml   Filed Weights   04/07/13 2200  Weight: 137.8 kg (303 lb 12.7 oz)    Exam:   General:  AAOx3  Cardiovascular: S1s2/RRR  Respiratory: CTAB  Abdomen: soft, mild lower quadrant tenderness, BS present  Musculoskeletal: no edema c/c   Data Reviewed: Basic Metabolic Panel:  Recent Labs Lab 04/07/13 1512 04/08/13 0340  NA 136 136  K 4.0 3.9  CL 100 102  CO2 22 22  GLUCOSE 115* 117*  BUN 12 9  CREATININE 0.80 0.74  CALCIUM 9.5 9.0   Liver Function Tests:  Recent Labs Lab 04/07/13 1512  AST 20  ALT 22  ALKPHOS 69  BILITOT 0.5  PROT 7.8  ALBUMIN 4.3   No results found for this basename: LIPASE, AMYLASE,  in the last 168 hours No results found for this basename: AMMONIA,  in the last 168 hours CBC:  Recent Labs Lab 04/07/13 1512 04/07/13 2229 04/08/13 0340  WBC 14.2*  --  15.0*  NEUTROABS 12.2*  --   --   HGB 16.7 15.7 14.9  15.1  HCT 46.5 43.9 42.4  43.0  MCV 86.9  --  87.4  PLT 265  --  298   Cardiac Enzymes: No results found for this basename:  CKTOTAL, CKMB, CKMBINDEX, TROPONINI,  in the last 168 hours BNP (last 3 results) No results found for this basename: PROBNP,  in the last 8760 hours CBG: No results found for this basename: GLUCAP,  in the last 168 hours  Recent Results (from the past 240 hour(s))  MRSA PCR SCREENING     Status: None   Collection Time    04/08/13 12:15 AM      Result Value Range Status   MRSA by PCR NEGATIVE  NEGATIVE Final   Comment:            The GeneXpert MRSA Assay (FDA     approved for NASAL specimens     only), is one component of a     comprehensive MRSA colonization     surveillance program. It is not     intended to diagnose MRSA     infection nor to guide or     monitor treatment for     MRSA infections.  CLOSTRIDIUM DIFFICILE BY PCR     Status: None   Collection Time    04/08/13  1:00 AM      Result Value Range Status   C difficile by pcr NEGATIVE  NEGATIVE Final   Comment: Performed at [facility]  STOOL CULTURE     Status: None   Collection Time  04/08/13  1:00 AM      Result Value Range Status   Specimen Description STOOL   Final   Special Requests NONE   Final   Culture     Final   Value: Culture reincubated for better growth     Performed at [facility]   Report Status PENDING   Incomplete     Studies: Ct Abdomen Pelvis W Contrast  04/07/2013   CLINICAL DATA:  Rectal bleeding. History of diverticulitis.  EXAM: CT ABDOMEN AND PELVIS WITH CONTRAST  TECHNIQUE: Multidetector CT imaging of the abdomen and pelvis was performed using the standard protocol following bolus administration of intravenous contrast.  CONTRAST:  OMNIPAQUE IOHEXOL 300 MG/ML  SOLN  COMPARISON:  None.  FINDINGS: There is significant long segment wall thickening of the colon beginning in the distal transverse colon and extending in a continuous fashion to the distal sigmoid colon. There is extensive diverticulosis of the sigmoid and descending colon. There is stranding in the adjacent  fat. No extraluminal bowel gas to suggest perforation. No evidence of abscess formation. No disproportionate dilatation of the colon. Mild stool burden in the ascending and proximal transverse colon.  Minimal atherosclerotic changes of the vasculature are noted.  The liver, gallbladder, spleen, pancreas, adrenal glands, and right kidney are within normal limits. There is a minimally complicated cysts which is lobulated from the lower pole of the left kidney.  No abnormal adenopathy. Trace free fluid in the pelvis.  Bladder is within normal limits. Unremarkable prostate gland.  There is advanced degenerative disc disease at L5-S1 with vacuum. No vertebral compression deformity. Severe degenerative change of the right hip joint. Moderate degenerative change of the left hip joint. Degenerative changes in the right SI joint. No destructive bone lesion. Calcification in the left iliopsoas distal tendon is noted.  IMPRESSION: Findings are most consistent with an acute inflammatory process involving the distal transverse colon, descending colon, and sigmoid colon related to acute diverticulitis. Because a long segment is involved, 1 would have to consider ischemia, pseudomembranous colitis, and infectious colitis.   Electronically Signed   By: Maryclare Bean M.D.   On: 04/07/2013 18:55    Scheduled Meds: . amitriptyline  50 mg Oral QHS  . antiseptic oral rinse  15 mL Mouth Rinse q12n4p  . chlorhexidine  15 mL Mouth Rinse BID  . ciprofloxacin  400 mg Intravenous Q12H  . [START ON 04/09/2013] influenza vac split quadrivalent PF  0.5 mL Intramuscular Tomorrow-1000  . losartan  100 mg Oral Daily  . metronidazole  500 mg Intravenous Q8H  . sodium chloride  3 mL Intravenous Q12H   Continuous Infusions:   Principal Problem:   Colitis presumed to be due to infection Active Problems:   ESSENTIAL HYPERTENSION   Diverticulosis   Hematochezia   Abdominal pain, acute   Protein-calorie malnutrition, severe    Time  spent:    [facility]  [facility] Pager [phone]. If 7PM-7AM, please contact night-coverage at [email or web address], password [facility] 04/08/2013, 1:41 PM  LOS: 1 day

## 2013-04-09 LAB — GI PATHOGEN PANEL BY PCR, STOOL
C difficile toxin A/B: NEGATIVE
Campylobacter by PCR: NEGATIVE
Cryptosporidium by PCR: NEGATIVE
E coli (ETEC) LT/ST: NEGATIVE
E coli 0157 by PCR: NEGATIVE
G lamblia by PCR: NEGATIVE
Salmonella by PCR: NEGATIVE

## 2013-04-09 LAB — BASIC METABOLIC PANEL
Chloride: 105 mEq/L (ref 96–112)
GFR calc Af Amer: >90 mL/min (ref >90)
Potassium: 3.3 mEq/L — ABNORMAL LOW (ref 3.5–5.1)

## 2013-04-09 NOTE — Progress Notes (Signed)
[facility] Progress Note  Subjective:  Says that blood/BM's are getting to be less frequent and lesser in amounts.  No abdominal pain.  Objective:  Vital signs in last 24 hours: Temp:  [98.3 F (36.8 C)-99.2 F (37.3 C)] 98.3 F (36.8 C) (10/10 0544) Pulse Rate:  [85-103] 85 (10/10 0544) Resp:  [16-18] 16 (10/10 0544) BP: (155-175)/(86-96) 155/93 mmHg (10/10 0640) SpO2:  [94 %-98 %] 94 % (10/10 0544) Last BM Date: 04/08/13 General:   Alert, Well-developed, in NAD Heart:  Regular rate and rhythm; no murmurs Pulm:  CTAB.  No W/R/R. Abdomen:  Soft, nontender and nondistended. Normal bowel sounds, without guarding, and without rebound.   Extremities:  Without edema. Neurologic:  Alert and  oriented x4;  grossly normal neurologically. Psych:  Alert and cooperative. Normal mood and affect.  Intake/Output from previous day: 10/09 0701 - 10/10 0700 In: 3325 [P.O.:1300; I.V.:1425; IV Piggyback:600] Out: -   Lab Results:  Recent Labs  04/07/13 1512 04/07/13 2229 04/08/13 0340 04/08/13 2023  WBC 14.2*  --  15.0* 16.8*  HGB 16.7 15.7 14.9  15.1 14.4  HCT 46.5 43.9 42.4  43.0 41.6  PLT 265  --  298 288   BMET  Recent Labs  04/07/13 1512 04/08/13 0340 04/09/13 0340  NA 136 136 139  K 4.0 3.9 3.3*  CL 100 102 105  CO2 22 22 23   GLUCOSE 115* 117* 110*  BUN 12 9 7   CREATININE 0.80 0.74 0.78  CALCIUM 9.5 9.0 8.7   LFT  Recent Labs  04/07/13 1512  PROT 7.8  ALBUMIN 4.3  AST 20  ALT 22  ALKPHOS 69  BILITOT 0.5   Ct Abdomen Pelvis W Contrast  04/07/2013   CLINICAL DATA:  Rectal bleeding. History of diverticulitis.  EXAM: CT ABDOMEN AND PELVIS WITH CONTRAST  TECHNIQUE: Multidetector CT imaging of the abdomen and pelvis was performed using the standard protocol following bolus administration of intravenous contrast.  CONTRAST:  OMNIPAQUE IOHEXOL 300 MG/ML  SOLN  COMPARISON:  None.  FINDINGS: There is significant long segment wall thickening of the  colon beginning in the distal transverse colon and extending in a continuous fashion to the distal sigmoid colon. There is extensive diverticulosis of the sigmoid and descending colon. There is stranding in the adjacent fat. No extraluminal bowel gas to suggest perforation. No evidence of abscess formation. No disproportionate dilatation of the colon. Mild stool burden in the ascending and proximal transverse colon.  Minimal atherosclerotic changes of the vasculature are noted.  The liver, gallbladder, spleen, pancreas, adrenal glands, and right kidney are within normal limits. There is a minimally complicated cysts which is lobulated from the lower pole of the left kidney.  No abnormal adenopathy. Trace free fluid in the pelvis.  Bladder is within normal limits. Unremarkable prostate gland.  There is advanced degenerative disc disease at L5-S1 with vacuum. No vertebral compression deformity. Severe degenerative change of the right hip joint. Moderate degenerative change of the left hip joint. Degenerative changes in the right SI joint. No destructive bone lesion. Calcification in the left iliopsoas distal tendon is noted.  IMPRESSION: Findings are most consistent with an acute inflammatory process involving the distal transverse colon, descending colon, and sigmoid colon related to acute diverticulitis. Because a long segment is involved, 1 would have to consider ischemia, pseudomembranous colitis, and infectious colitis.   Electronically Signed   By: Maryclare Bean M.D.   On: 04/07/2013 18:55    Assessment / Plan: -  Acute onset of bloody diarrhea and abdominal pain with colitis seen on CT scan: Differential includes infectious vs ischemic etiologies.  -Leukocytosis secondary to the above.   *Agree with empiric treatment with cipro and flagyl.  *Await results of GI pathogen panel. *Monitor Hgb.  *Continue supportive care.  *Advance to full liquids this am and can advance from there to low fiber/low residue if  tolerated.    LOS: 2 days   ZEHR, JESSICA D.  04/09/2013, 9:16 AM  Pager number [phone]  I have personally taken an interval history, reviewed the chart, and examined the patient.  I agree with the extender's note, impression and recommendations.  He continues to improve.  Strongly suspect infectious colitis.  Continue antibiotics.  Barbette Hair. Arlyce Dice, MD, [facility] [phone]

## 2013-04-09 NOTE — Progress Notes (Signed)
[facility] PROGRESS NOTE  Donald [name] WJX:[id number] DOB: January 29, 1956 DOA: 04/07/2013 PCP: Judie Petit, MD  Assessment/Plan: 1. Colitis, likely infectious -h/o colonoscopy 3 years ago with diverticulosis -improving -supportive care, advance to full liquids - IVF, CIpro/Flagyl -Cdiff negative, GI pathogen panel pending -[place] [facility] consult appreciated  2. HTN better controlled -continue losartan, hydralazine PRN  DVt proph: SCDs  Code Status: Full Family Communication: none at bedside Disposition Plan: home when improved   Consultants:  [facility]  Antibiotics:  Cipro/flagyl 10/8  HPI/Subjective: Improved abd discomfort and less bloody BMs  Objective: Filed Vitals:   04/09/13 0640  BP: 155/93  Pulse:   Temp:   Resp:     Intake/Output Summary (Last 24 hours) at 04/09/13 0830 Last data filed at 04/08/13 2300  Gross per 24 hour  Intake   3325 ml  Output      0 ml  Net   3325 ml   Filed Weights   04/07/13 2200  Weight: 137.8 kg (303 lb 12.7 oz)    Exam:   General:  AAOx3  Cardiovascular: S1s2/RRR  Respiratory: CTAB  Abdomen: soft, non tender, mildly distended, BS present  Musculoskeletal: no edema c/c   Data Reviewed: Basic Metabolic Panel:  Recent Labs Lab 04/07/13 1512 04/08/13 0340 04/09/13 0340  NA 136 136 139  K 4.0 3.9 3.3*  CL 100 102 105  CO2 22 22 23   GLUCOSE 115* 117* 110*  BUN 12 9 7   CREATININE 0.80 0.74 0.78  CALCIUM 9.5 9.0 8.7   Liver Function Tests:  Recent Labs Lab 04/07/13 1512  AST 20  ALT 22  ALKPHOS 69  BILITOT 0.5  PROT 7.8  ALBUMIN 4.3   No results found for this basename: LIPASE, AMYLASE,  in the last 168 hours No results found for this basename: AMMONIA,  in the last 168 hours CBC:  Recent Labs Lab 04/07/13 1512 04/07/13 2229 04/08/13 0340 04/08/13 2023  WBC 14.2*  --  15.0* 16.8*  NEUTROABS 12.2*  --   --   --   HGB 16.7 15.7 14.9  15.1 14.4  HCT 46.5 43.9 42.4  43.0  41.6  MCV 86.9  --  87.4 87.9  PLT 265  --  298 288   Cardiac Enzymes: No results found for this basename: CKTOTAL, CKMB, CKMBINDEX, TROPONINI,  in the last 168 hours BNP (last 3 results) No results found for this basename: PROBNP,  in the last 8760 hours CBG: No results found for this basename: GLUCAP,  in the last 168 hours  Recent Results (from the past 240 hour(s))  MRSA PCR SCREENING     Status: None   Collection Time    04/08/13 12:15 AM      Result Value Range Status   MRSA by PCR NEGATIVE  NEGATIVE Final   Comment:            The GeneXpert MRSA Assay (FDA     approved for NASAL specimens     only), is one component of a     comprehensive MRSA colonization     surveillance program. It is not     intended to diagnose MRSA     infection nor to guide or     monitor treatment for     MRSA infections.  CLOSTRIDIUM DIFFICILE BY PCR     Status: None   Collection Time    04/08/13  1:00 AM      Result Value Range Status   C difficile  by pcr NEGATIVE  NEGATIVE Final   Comment: Performed at [facility]  STOOL CULTURE     Status: None   Collection Time    04/08/13  1:00 AM      Result Value Range Status   Specimen Description STOOL   Final   Special Requests NONE   Final   Culture     Final   Value: Culture reincubated for better growth     Performed at [facility]   Report Status PENDING   Incomplete     Studies: Ct Abdomen Pelvis W Contrast  04/07/2013   CLINICAL DATA:  Rectal bleeding. History of diverticulitis.  EXAM: CT ABDOMEN AND PELVIS WITH CONTRAST  TECHNIQUE: Multidetector CT imaging of the abdomen and pelvis was performed using the standard protocol following bolus administration of intravenous contrast.  CONTRAST:  OMNIPAQUE IOHEXOL 300 MG/ML  SOLN  COMPARISON:  None.  FINDINGS: There is significant long segment wall thickening of the colon beginning in the distal transverse colon and extending in a continuous fashion to the distal sigmoid  colon. There is extensive diverticulosis of the sigmoid and descending colon. There is stranding in the adjacent fat. No extraluminal bowel gas to suggest perforation. No evidence of abscess formation. No disproportionate dilatation of the colon. Mild stool burden in the ascending and proximal transverse colon.  Minimal atherosclerotic changes of the vasculature are noted.  The liver, gallbladder, spleen, pancreas, adrenal glands, and right kidney are within normal limits. There is a minimally complicated cysts which is lobulated from the lower pole of the left kidney.  No abnormal adenopathy. Trace free fluid in the pelvis.  Bladder is within normal limits. Unremarkable prostate gland.  There is advanced degenerative disc disease at L5-S1 with vacuum. No vertebral compression deformity. Severe degenerative change of the right hip joint. Moderate degenerative change of the left hip joint. Degenerative changes in the right SI joint. No destructive bone lesion. Calcification in the left iliopsoas distal tendon is noted.  IMPRESSION: Findings are most consistent with an acute inflammatory process involving the distal transverse colon, descending colon, and sigmoid colon related to acute diverticulitis. Because a long segment is involved, 1 would have to consider ischemia, pseudomembranous colitis, and infectious colitis.   Electronically Signed   By: Maryclare Bean M.D.   On: 04/07/2013 18:55    Scheduled Meds: . amitriptyline  50 mg Oral QHS  . antiseptic oral rinse  15 mL Mouth Rinse q12n4p  . chlorhexidine  15 mL Mouth Rinse BID  . ciprofloxacin  400 mg Intravenous Q12H  . influenza vac split quadrivalent PF  0.5 mL Intramuscular Tomorrow-1000  . losartan  100 mg Oral Daily  . metronidazole  500 mg Intravenous Q8H  . sodium chloride  3 mL Intravenous Q12H   Continuous Infusions: . sodium chloride 50 mL/hr at 04/09/13 9811    Principal Problem:   Colitis presumed to be due to infection Active  Problems:   ESSENTIAL HYPERTENSION   Diverticulosis   Hematochezia   Abdominal pain, acute   Protein-calorie malnutrition, severe    Time spent:    [facility]  [facility] Pager [phone]. If 7PM-7AM, please contact night-coverage at [email or web address], password [facility] 04/09/2013, 8:30 AM  LOS: 2 days

## 2013-04-10 DIAGNOSIS — M109 Gout, unspecified: Secondary | ICD-10-CM

## 2013-04-10 LAB — CBC
HCT: 38.9 % — ABNORMAL LOW (ref 39.0–52.0)
Hemoglobin: 13 g/dL (ref 13.0–17.0)
MCH: 29.7 pg (ref 26.0–34.0)
WBC: 11.8 10*3/uL — ABNORMAL HIGH (ref 4.0–10.5)

## 2013-04-10 LAB — BASIC METABOLIC PANEL
BUN: 7 mg/dL (ref 6–23)
CO2: 23 mEq/L (ref 19–32)
Calcium: 8.7 mg/dL (ref 8.4–10.5)
Chloride: 106 mEq/L (ref 96–112)
Creatinine, Ser: 0.83 mg/dL (ref 0.50–1.35)
GFR calc Af Amer: >90 mL/min (ref >90)
Glucose, Bld: 100 mg/dL — ABNORMAL HIGH (ref 70–99)
Potassium: 3.4 mEq/L — ABNORMAL LOW (ref 3.5–5.1)

## 2013-04-10 MED ORDER — METRONIDAZOLE 500 MG PO TABS: 500.0000 mg | ORAL_TABLET | Freq: Three times a day (TID) | ORAL | Status: DC

## 2013-04-10 MED ORDER — CIPROFLOXACIN HCL 500 MG PO TABS: 500.0000 mg | ORAL_TABLET | Freq: Two times a day (BID) | ORAL | Status: DC

## 2013-04-11 LAB — STOOL CULTURE

## 2013-04-19 ENCOUNTER — Encounter: Payer: Self-pay | Admitting: Internal Medicine

## 2013-04-21 NOTE — Discharge Summary (Signed)
Physician Discharge Summary  Donald [name] ZOX:[id number] DOB: July 28, 1955 DOA: 04/07/2013  PCP: Judie Petit, MD  Admit date: 04/07/2013 Discharge date: 04/10/2013  Time spent:  Recommendations for Outpatient Follow-up:  1. PCP in 1 week  Discharge Diagnoses:  Principal Problem:   Colitis presumed to be due to infection Active Problems:   ESSENTIAL HYPERTENSION   Diverticulosis   Hematochezia   Abdominal pain, acute   Protein-calorie malnutrition, severe   Discharge Condition: stable  Diet recommendation: low sodium  Filed Weights   04/07/13 2200  Weight: 137.8 kg (303 lb 12.7 oz)    History of present illness:  57 yo male healthy comes in with sudden onset of bloody diarrhea and abd pain since 4am today. Has had over 6 bloody stools, abd pain more in upper abd, no n/v. No fevers. Has diverticulosis no previous history of colitis or diverticulitis. No recent abx. He did have total knee replacement on the left about 3 months ago, developed a rash on the wound from the tape and was given something at that time but not sure if it was abx or not. 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 Has had unevently recovery from knee surgery otherwise.      Hospital Course:  1. Colitis, likely infectious -h/o colonoscopy 3 years ago with diverticulosis  -improved with supportive care, IVF, empiric Abx -diet slowly advanced  - to complete 10day course of CIPro/Flagyl -Cdiff negative, GI pathogen panel pending  -[place] [facility] consult appreciated   2. HTN  -controlled  -continue losartan, hydralazine PRN used initially   Consultations:  [facility]  Discharge Exam: Filed Vitals:   04/10/13 0908  BP: 152/94  Pulse: 88  Temp:   Resp:     General: AAOx3 Cardiovascular:S1S2/RRR Respiratory: CTAB  Discharge Instructions  Discharge Orders   Future Appointments Provider Department Dept Phone   05/21/2013 3:00 PM Hilarie Fredrickson, MD [facility] [phone]   Future Orders Complete By Expires   Discharge instructions  As directed    Comments:     Parke Simmers diet, advance as tolerated   Increase activity slowly  As directed        Medication List    STOP taking these medications       meloxicam 15 MG tablet  Commonly known as:  MOBIC      TAKE these medications       amitriptyline 50 MG tablet  Commonly known as:  ELAVIL  Take 50 mg by mouth at bedtime.     ciprofloxacin 500 MG tablet  Commonly known as:  CIPRO  Take 1 tablet (500 mg total) by mouth 2 (two) times daily. For 6 days     HYDROcodone-acetaminophen 10-325 MG per tablet  Commonly known as:  NORCO  Take 1 tablet by mouth every 6 (six) hours as needed for pain.     loratadine 10 MG tablet  Commonly known as:  CLARITIN  Take 10 mg by mouth as needed for allergies. Allergies.     losartan 100 MG tablet  Commonly known as:  COZAAR  Take 100 mg by mouth daily.     metroNIDAZOLE 500 MG tablet  Commonly known as:  FLAGYL  Take 1 tablet (500 mg total) by mouth 3 (three) times daily. For 6 days     traZODone 50 MG tablet  Commonly known as:  DESYREL  Take 50 mg by mouth 3 times/day as needed-between meals & bedtime for sleep.     zolpidem 10 MG tablet  Commonly known as:  AMBIEN  Take 10 mg by mouth at bedtime as needed for sleep.       No Known Allergies     Follow-up Information   Follow up with Yancey Flemings, MD. Schedule an appointment as soon as possible for a visit in 2 weeks.   Specialty:  Gastroenterology   Contact information:   [address] [place] [address] [phone]        The results of significant diagnostics from this hospitalization (including imaging, microbiology, ancillary and laboratory) are listed below for reference.    Significant Diagnostic Studies: Ct Abdomen Pelvis W Contrast  04/07/2013   CLINICAL DATA:  Rectal bleeding. History of diverticulitis.  EXAM: CT ABDOMEN AND PELVIS WITH CONTRAST  TECHNIQUE: Multidetector CT  imaging of the abdomen and pelvis was performed using the standard protocol following bolus administration of intravenous contrast.  CONTRAST:  OMNIPAQUE IOHEXOL 300 MG/ML  SOLN  COMPARISON:  None.  FINDINGS: There is significant long segment wall thickening of the colon beginning in the distal transverse colon and extending in a continuous fashion to the distal sigmoid colon. There is extensive diverticulosis of the sigmoid and descending colon. There is stranding in the adjacent fat. No extraluminal bowel gas to suggest perforation. No evidence of abscess formation. No disproportionate dilatation of the colon. Mild stool burden in the ascending and proximal transverse colon.  Minimal atherosclerotic changes of the vasculature are noted.  The liver, gallbladder, spleen, pancreas, adrenal glands, and right kidney are within normal limits. There is a minimally complicated cysts which is lobulated from the lower pole of the left kidney.  No abnormal adenopathy. Trace free fluid in the pelvis.  Bladder is within normal limits. Unremarkable prostate gland.  There is advanced degenerative disc disease at L5-S1 with vacuum. No vertebral compression deformity. Severe degenerative change of the right hip joint. Moderate degenerative change of the left hip joint. Degenerative changes in the right SI joint. No destructive bone lesion. Calcification in the left iliopsoas distal tendon is noted.  IMPRESSION: Findings are most consistent with an acute inflammatory process involving the distal transverse colon, descending colon, and sigmoid colon related to acute diverticulitis. Because a long segment is involved, 1 would have to consider ischemia, pseudomembranous colitis, and infectious colitis.   Electronically Signed   By: Maryclare Bean M.D.   On: 04/07/2013 18:55    Microbiology: No results found for this or any previous visit (from the past 240 hour(s)).   Labs: Basic Metabolic Panel: No results found for this  basename: NA, K, CL, CO2, GLUCOSE, BUN, CREATININE, CALCIUM, MG, PHOS,  in the last 168 hours Liver Function Tests: No results found for this basename: AST, ALT, ALKPHOS, BILITOT, PROT, ALBUMIN,  in the last 168 hours No results found for this basename: LIPASE, AMYLASE,  in the last 168 hours No results found for this basename: AMMONIA,  in the last 168 hours CBC: No results found for this basename: WBC, NEUTROABS, HGB, HCT, MCV, PLT,  in the last 168 hours Cardiac Enzymes: No results found for this basename: CKTOTAL, CKMB, CKMBINDEX, TROPONINI,  in the last 168 hours BNP: BNP (last 3 results) No results found for this basename: PROBNP,  in the last 8760 hours CBG: No results found for this basename: GLUCAP,  in the last 168 hours     Signed:  Darell Saputo  [facility] 04/21/2013, 2:34 PM

## 2013-05-06 ENCOUNTER — Other Ambulatory Visit: Payer: Self-pay | Admitting: Internal Medicine

## 2013-05-19 ENCOUNTER — Ambulatory Visit: Payer: BC Managed Care – PPO | Admitting: Internal Medicine

## 2013-05-21 ENCOUNTER — Encounter: Payer: Self-pay | Admitting: Internal Medicine

## 2013-05-21 ENCOUNTER — Ambulatory Visit (INDEPENDENT_AMBULATORY_CARE_PROVIDER_SITE_OTHER): Payer: BC Managed Care – PPO | Admitting: Internal Medicine

## 2013-05-21 VITALS — BP 136/94 | HR 84 | Ht 75.0 in | Wt 305.4 lb

## 2013-05-21 DIAGNOSIS — A09 Infectious gastroenteritis and colitis, unspecified: Secondary | ICD-10-CM

## 2013-05-21 DIAGNOSIS — R933 Abnormal findings on diagnostic imaging of other parts of digestive tract: Secondary | ICD-10-CM

## 2013-05-21 DIAGNOSIS — K625 Hemorrhage of anus and rectum: Secondary | ICD-10-CM

## 2013-05-21 NOTE — Progress Notes (Signed)
HISTORY OF PRESENT ILLNESS:  Donald [name] is a 57 y.o. male with the below listed medical history who presents today for post hospitalization followup. I saw the patient in November of 2011 when he was found to have 3 subcentimeter polyps which were adenomatous appearing (incomplete pathology) and hyperplastic. Followup in 5 years recommended. He was well until 04/07/2013 when he developed sudden onset bloody diarrhea. This persisted for a day. He was hospitalized. CT scan revealed left-sided colitis. Stool studies were negative. He was treated symptomatically as well as empirically with Cipro and Flagyl. Within a day or 2 he improved. No recurrent symptoms. He is now well.  REVIEW OF SYSTEMS:  All non-GI ROS negative except for knee pain  Past Medical History  Diagnosis Date  . Hypertension   . Bronchitis     HISTORY FEW MO AGO  . Arthritis   . Diverticulitis   . Colon polyps     Past Surgical History  Procedure Laterality Date  . Total knee arthroplasty Left 01/08/2013    Procedure: TOTAL KNEE ARTHROPLASTY;  Surgeon: Harvie Junior, MD;  Location: [facility] OR;  Service: Orthopedics;  Laterality: Left;    Social History Donald [name]  reports that he has never smoked. He has never used smokeless tobacco. He reports that he drinks alcohol. He reports that he does not use illicit drugs.  family history includes COPD in his mother; Cancer in his father; Emphysema in his mother.  No Known Allergies     PHYSICAL EXAMINATION: Vital signs: BP 136/94  Pulse 84  Ht 6\' 3"  (1.905 m)  Wt 305 lb 6.4 oz (138.529 kg)  BMI 38.17 kg/m2 General: Well-developed, well-nourished, no acute distress HEENT: Sclerae are anicteric, conjunctiva pink. Oral mucosa intact Lungs: Clear Heart: Regular Abdomen: soft, nontender, nondistended, no obvious ascites, no peritoneal signs, normal bowel sounds. No organomegaly. Extremities: No edema Psychiatric: alert and oriented x3.  Cooperative     ASSESSMENT:  #1. Recent acute illness as manifested by bloody diarrhea. Most certainly bacterial colitis. Resolved #2. History of colon polyp.   PLAN:  #1. Expectant management #2. Surveillance colonoscopy around November 2016 #3. Interval followup as needed

## 2013-05-21 NOTE — Patient Instructions (Signed)
Please follow up with Dr. Perry as needed 

## 2013-06-28 ENCOUNTER — Other Ambulatory Visit: Payer: Self-pay | Admitting: Internal Medicine

## 2013-07-03 ENCOUNTER — Other Ambulatory Visit: Payer: Self-pay | Admitting: Internal Medicine

## 2013-07-23 ENCOUNTER — Ambulatory Visit (INDEPENDENT_AMBULATORY_CARE_PROVIDER_SITE_OTHER): Payer: BC Managed Care – PPO | Admitting: Internal Medicine

## 2013-07-23 ENCOUNTER — Encounter: Payer: Self-pay | Admitting: Internal Medicine

## 2013-07-23 VITALS — BP 160/90 | HR 77 | Temp 98.7°F | Resp 20 | Ht 75.0 in | Wt 305.0 lb

## 2013-07-23 DIAGNOSIS — I10 Essential (primary) hypertension: Secondary | ICD-10-CM

## 2013-07-23 MED ORDER — DICLOFENAC SODIUM 75 MG PO TBEC: 75.0000 mg | DELAYED_RELEASE_TABLET | Freq: Two times a day (BID) | ORAL | Status: DC

## 2013-07-23 MED ORDER — LOSARTAN POTASSIUM 100 MG PO TABS: 100.0000 mg | ORAL_TABLET | Freq: Every day | ORAL | Status: DC

## 2013-07-23 NOTE — Progress Notes (Signed)
Subjective:    Patient ID: Donald [name], male    DOB: 1955-12-21, 58 y.o.   MRN: [id number]  HPI  BP Readings from Last 3 Encounters:  07/23/13 160/90  05/21/13 136/94  04/10/13 57/33    58 year old patient who has a history of essential hypertension. He also has a history of gout that is very infrequent. He is basically here today for a medicine check and refills. He is scheduled for followup with his PCP in 2 months. He states blood pressure readings generally run in the high normal range No new concerns or complaints. He has been evaluated by GI recently  Past Medical History  Diagnosis Date  . Hypertension   . Bronchitis     HISTORY FEW MO AGO  . Arthritis   . Diverticulitis   . Colon polyps     History   Social History  . Marital Status: Single    Spouse Name: N/A    Number of Children: N/A  . Years of Education: N/A   Occupational History  . Animal nutritionist    Social History Main Topics  . Smoking status: Never Smoker   . Smokeless tobacco: Never Used  . Alcohol Use: Yes     Comment: 2-3 times a week  . Drug Use: No  . Sexual Activity: Not on file   Other Topics Concern  . Not on file   Social History Narrative  . No narrative on file    Past Surgical History  Procedure Laterality Date  . Total knee arthroplasty Left 01/08/2013    Procedure: TOTAL KNEE ARTHROPLASTY;  Surgeon: Alta Corning, MD;  Location: [facility];  Service: Orthopedics;  Laterality: Left;    Family History  Problem Relation Age of Onset  . Emphysema Mother   . COPD Mother   . Cancer Father     No Known Allergies  Current Outpatient Prescriptions on File Prior to Visit  Medication Sig Dispense Refill  . amitriptyline (ELAVIL) 50 MG tablet Take 50 mg by mouth at bedtime.      Marland Kitchen loratadine (CLARITIN) 10 MG tablet Take 10 mg by mouth as needed for allergies. Allergies.      Marland Kitchen traMADol (ULTRAM) 50 MG tablet Take 50 mg by mouth every 6 (six) hours as needed.      . traZODone  (DESYREL) 50 MG tablet TAKE 1/2 TO 1 TABLET AT BEDTIME AS NEEDED FOR INSOMNIA  30 tablet  2  . zolpidem (AMBIEN) 10 MG tablet Take 10 mg by mouth at bedtime as needed for sleep.       No current facility-administered medications on file prior to visit.    BP 160/90  Pulse 77  Temp(Src) 98.7 F (37.1 C) (Oral)  Resp 20  Ht 6\' 3"  (1.905 m)  Wt 305 lb (138.347 kg)  BMI 38.12 kg/m2  SpO2 97%     Review of Systems  Constitutional: Negative for fever, chills, appetite change and fatigue.  HENT: Negative for congestion, dental problem, ear pain, hearing loss, sore throat, tinnitus, trouble swallowing and voice change.   Eyes: Negative for pain, discharge and visual disturbance.  Respiratory: Negative for cough, chest tightness, wheezing and stridor.   Cardiovascular: Negative for chest pain, palpitations and leg swelling.  Gastrointestinal: Negative for nausea, vomiting, abdominal pain, diarrhea, constipation, blood in stool and abdominal distention.  Genitourinary: Negative for urgency, hematuria, flank pain, discharge, difficulty urinating and genital sores.  Musculoskeletal: Negative for arthralgias, back pain, gait problem, joint swelling,  myalgias and neck stiffness.  Skin: Negative for rash.  Neurological: Negative for dizziness, syncope, speech difficulty, weakness, numbness and headaches.  Hematological: Negative for adenopathy. Does not bruise/bleed easily.  Psychiatric/Behavioral: Negative for behavioral problems and dysphoric mood. The patient is not nervous/anxious.        Objective:   Physical Exam  Constitutional: He is oriented to person, place, and time. He appears well-developed.  Overweight Weight 305 Blood pressure 140/90  HENT:  Head: Normocephalic.  Right Ear: External ear normal.  Left Ear: External ear normal.  Eyes: Conjunctivae and EOM are normal.  Neck: Normal range of motion.  Cardiovascular: Normal rate and normal heart sounds.   Pulmonary/Chest:  Breath sounds normal.  Abdominal: Bowel sounds are normal.  Musculoskeletal: Normal range of motion. He exhibits no edema and no tenderness.  Neurological: He is alert and oriented to person, place, and time.  Psychiatric: He has a normal mood and affect. His behavior is normal.          Assessment & Plan:   Hypertension. Will continue home blood pressure monitoring. Followup PCP in 2 months as planned. May consider a second drug at that time his blood pressure under suboptimal control. Exercise weight loss low salt diet all encouraged

## 2013-07-23 NOTE — Progress Notes (Signed)
Pre-visit discussion using our clinic review tool. No additional management support is needed unless otherwise documented below in the visit note.  

## 2013-07-23 NOTE — Patient Instructions (Signed)
Limit your sodium (Salt) intake    It is important that you exercise regularly, at least 20 minutes 3 to 4 times per week.  If you develop chest pain or shortness of breath seek  medical attention.  You need to lose weight.  Consider a lower calorie diet and regular exercise.  DASH Diet The DASH diet stands for "Dietary Approaches to Stop Hypertension." It is a healthy eating plan that has been shown to reduce high blood pressure (hypertension) in as little as 14 days, while also possibly providing other significant health benefits. These other health benefits include reducing the risk of breast cancer after menopause and reducing the risk of type 2 diabetes, heart disease, colon cancer, and stroke. Health benefits also include weight loss and slowing kidney failure in patients with chronic kidney disease.  DIET GUIDELINES  Limit salt (sodium). Your diet should contain less than 1500 mg of sodium daily.  Limit refined or processed carbohydrates. Your diet should include mostly whole grains. Desserts and added sugars should be used sparingly.  Include small amounts of heart-healthy fats. These types of fats include nuts, oils, and tub margarine. Limit saturated and trans fats. These fats have been shown to be harmful in the body. CHOOSING FOODS  The following food groups are based on a 2000 calorie diet. See your Registered Dietitian for individual calorie needs. Grains and Grain Products (6 to 8 servings daily)  Eat More Often: Whole-wheat bread, brown rice, whole-grain or wheat pasta, quinoa, popcorn without added fat or salt (air popped).  Eat Less Often: White bread, white pasta, white rice, cornbread. Vegetables (4 to 5 servings daily)  Eat More Often: Fresh, frozen, and canned vegetables. Vegetables may be raw, steamed, roasted, or grilled with a minimal amount of fat.  Eat Less Often/Avoid: Creamed or fried vegetables. Vegetables in a cheese sauce. Fruit (4 to 5 servings  daily)  Eat More Often: All fresh, canned (in natural juice), or frozen fruits. Dried fruits without added sugar. One hundred percent fruit juice ( cup [237 mL] daily).  Eat Less Often: Dried fruits with added sugar. Canned fruit in light or heavy syrup. Lean Meats, Fish, and Poultry (2 servings or less daily. One serving is 3 to 4 oz [85-114 g]).  Eat More Often: Ninety percent or leaner ground beef, tenderloin, sirloin. Round cuts of beef, chicken breast, turkey breast. All fish. Grill, bake, or broil your meat. Nothing should be fried.  Eat Less Often/Avoid: Fatty cuts of meat, turkey, or chicken leg, thigh, or wing. Fried cuts of meat or fish. Dairy (2 to 3 servings)  Eat More Often: Low-fat or fat-free milk, low-fat plain or light yogurt, reduced-fat or part-skim cheese.  Eat Less Often/Avoid: Milk (whole, 2%).Whole milk yogurt. Full-fat cheeses. Nuts, Seeds, and Legumes (4 to 5 servings per week)  Eat More Often: All without added salt.  Eat Less Often/Avoid: Salted nuts and seeds, canned beans with added salt. Fats and Sweets (limited)  Eat More Often: Vegetable oils, tub margarines without trans fats, sugar-free gelatin. Mayonnaise and salad dressings.  Eat Less Often/Avoid: Coconut oils, palm oils, butter, stick margarine, cream, half and half, cookies, candy, pie. FOR MORE INFORMATION The Dash Diet Eating Plan: www.dashdiet.org Document Released: 06/06/2011 Document Revised: 09/09/2011 Document Reviewed: 06/06/2011 [employer] Patient Information 2014 [employer].  

## 2013-07-29 ENCOUNTER — Other Ambulatory Visit: Payer: Self-pay | Admitting: Internal Medicine

## 2013-08-04 ENCOUNTER — Telehealth: Payer: Self-pay | Admitting: Internal Medicine

## 2013-08-04 NOTE — Telephone Encounter (Signed)
Relevant patient education mailed to patient.  

## 2013-09-23 ENCOUNTER — Other Ambulatory Visit: Payer: Self-pay | Admitting: Internal Medicine

## 2013-09-23 NOTE — Telephone Encounter (Signed)
You have not seen pt since 2011 but [place] and Dr Raliegh Ip has seen him recently

## 2013-11-18 ENCOUNTER — Other Ambulatory Visit: Payer: Self-pay | Admitting: Internal Medicine

## 2013-11-18 NOTE — Telephone Encounter (Signed)
Patient seen Dr Raliegh Ip 07/2013, but has not seen you since 2011

## 2013-12-02 ENCOUNTER — Other Ambulatory Visit: Payer: Self-pay | Admitting: Internal Medicine

## 2014-01-08 ENCOUNTER — Other Ambulatory Visit: Payer: Self-pay | Admitting: Internal Medicine

## 2014-04-06 ENCOUNTER — Encounter: Payer: Self-pay | Admitting: Internal Medicine

## 2014-04-08 ENCOUNTER — Other Ambulatory Visit: Payer: Self-pay | Admitting: Internal Medicine

## 2014-05-11 ENCOUNTER — Other Ambulatory Visit: Payer: Self-pay | Admitting: Internal Medicine

## 2014-05-13 ENCOUNTER — Encounter: Payer: Self-pay | Admitting: Family Medicine

## 2014-05-13 ENCOUNTER — Ambulatory Visit (INDEPENDENT_AMBULATORY_CARE_PROVIDER_SITE_OTHER): Payer: BC Managed Care – PPO | Admitting: Family Medicine

## 2014-05-13 VITALS — BP 130/82 | Temp 98.6°F | Wt 336.0 lb

## 2014-05-13 DIAGNOSIS — M79671 Pain in right foot: Secondary | ICD-10-CM

## 2014-05-13 DIAGNOSIS — M79672 Pain in left foot: Secondary | ICD-10-CM

## 2014-05-13 NOTE — Patient Instructions (Signed)
Achilles Tendonitis/possible bursitis or slight tear  Take diclofenac twice a day for 10 days  Ice area at least twice a day for 10 minutes for 3 days  Start stretches now and exercises in 3 days  Add heel cups especially at work to take some tension off the achilles  See [place] back in 10 days. We will see how you are doing. If not at least 50% improvement, consider referral to sports medicine for potential ultrasound

## 2014-05-13 NOTE — Progress Notes (Signed)
  Garret Reddish, MD Phone: [phone]  Subjective:   Donald [name] is a 58 y.o. year old very pleasant male patient who presents with the following:  Bilateral Heel Pain History of plantar fasciitis that would flare on right heel that would use diclofenac (more on the bottom of his foot). Current symptoms x 2 months and more on posterior portion of heel.   Has tried new shoes, new inserts (not heel cups). Nothing has helped so far. Sitting is fine but when walking at work pain greatly intensifies. Heels bothering more than "bone on bone" hip. Might go a day or two and then take one diclofenac but no regular daily use. No clear radiation except up slightly at times. Heel pain up to 6/10 at its worst. 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 1st step of the day hurts but not worst of the day. Uses spike ball on the bottom of his foot and back of his heel and provides temporary relief and then tends to come back.  ROS- No redness. Mild swelling at insertion of achilles.   Past Medical History- history of L knee replacement last year. Right hip pending and planning on in May.  history colitis and hematochezia, OA L knee, asthmatic bronchitis, GOUT, HTN, insomnia  Medications- reviewed and updated Current Outpatient Prescriptions  Medication Sig Dispense Refill  . amitriptyline (ELAVIL) 50 MG tablet Take 50 mg by mouth at bedtime.    . diclofenac (VOLTAREN) 75 MG EC tablet TAKE 1 TABLET TWICE DAILY 60 tablet 0  . loratadine (CLARITIN) 10 MG tablet Take 10 mg by mouth as needed for allergies. Allergies.    Marland Kitchen losartan (COZAAR) 100 MG tablet TAKE 1 TABLET DAILY 90 tablet 1  . traZODone (DESYREL) 50 MG tablet TAKE 1/2 - 1 TABLET AT BEDTIME AS NEEDED FOR INSOMNIA 30 tablet 1  . zolpidem (AMBIEN) 10 MG tablet Take 10 mg by mouth at bedtime as needed for sleep.     No current facility-administered medications for this visit.    Objective: BP 130/82 mmHg  Temp(Src) 98.6 F (37 C)  Wt 336  lb (152.409 kg) Gen: NAD, resting comfortably  Ankle: No visible erythema or swelling (other than chronic pretibial 1+ edema) Range of motion is full in all directions. Strength is 5/5 in all directions. Stable lateral and medial ligaments;  No tenderness on posterior aspects of lateral and medial malleolus Able to walk 4 steps.  Able to do heel raises x 10 with progressive pain. Does have slight swelling at insertion point on left heel though right heel pain worse.  Patient with pain to palpation at insertion of achilles more on medial side on both right and left foot. No pain up the achilles (focused at insertion or potential bursa)   Assessment/Plan:  Bilateral Heel Pain Suspect potential retrocalcaneal bursitis vs partial tear of achilles or tendonitis. Advised heel cup,icing, diclofenac scheduled x 10 days, home exercise program. Follow up 10-14 days. Would give sports medicine referral if not at least 50% improvement.

## 2014-06-03 ENCOUNTER — Encounter: Payer: Self-pay | Admitting: Family Medicine

## 2014-06-03 ENCOUNTER — Ambulatory Visit (INDEPENDENT_AMBULATORY_CARE_PROVIDER_SITE_OTHER): Payer: BC Managed Care – PPO | Admitting: Family Medicine

## 2014-06-03 VITALS — BP 140/98 | HR 75 | Temp 98.0°F

## 2014-06-03 DIAGNOSIS — M79671 Pain in right foot: Secondary | ICD-10-CM

## 2014-06-03 DIAGNOSIS — M7661 Achilles tendinitis, right leg: Secondary | ICD-10-CM

## 2014-06-03 DIAGNOSIS — M79672 Pain in left foot: Secondary | ICD-10-CM

## 2014-06-03 DIAGNOSIS — M7662 Achilles tendinitis, left leg: Secondary | ICD-10-CM

## 2014-06-03 NOTE — Patient Instructions (Addendum)
Sorry we didn't get you better sooner. You will be in excellent hands with Dr. Tamala Julian of sports medicine.

## 2014-06-03 NOTE — Progress Notes (Signed)
  Garret Reddish, MD Phone: [phone]  Subjective:   Donald [name] is a 58 y.o. year old very pleasant male patient who presents with the following:  Bilateral Heel Pain  Seen on 05/13/14 after 2 months of pain in bilateral heels near achilles tendon insertion. At that time he was thought to have Plantar fasciitis vs retrocalcaneal bursitis. Patient instructed to use diclofenac for 10 days, ice for 3 days, then start exercises at day 3 and add heel cups. Pain maybe 5/10 when 6/10 previous. Pain used to be more on the sides but today patient states is midline around his achilles tendon. Overall very minimal improvement. Home exercise program actually made pain worse.   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   ROS- No redness. Mild swelling at insertion of achilles.   Past Medical History- history of L knee replacement last year. Right hip pending and planning on in May.  history colitis and hematochezia, OA L knee, asthmatic bronchitis, GOUT, HTN, insomnia  Medications- reviewed and updated Current Outpatient Prescriptions  Medication Sig Dispense Refill  . amitriptyline (ELAVIL) 50 MG tablet Take 50 mg by mouth at bedtime.    Marland Kitchen loratadine (CLARITIN) 10 MG tablet Take 10 mg by mouth as needed for allergies. Allergies.    Marland Kitchen losartan (COZAAR) 100 MG tablet TAKE 1 TABLET DAILY 90 tablet 1  . traZODone (DESYREL) 50 MG tablet TAKE 1/2 - 1 TABLET AT BEDTIME AS NEEDED FOR INSOMNIA 30 tablet 1  . zolpidem (AMBIEN) 10 MG tablet Take 10 mg by mouth at bedtime as needed for sleep.     No current facility-administered medications for this visit.    Objective: BP 140/98 mmHg  Pulse 75  Temp(Src) 98 F (36.7 C) Gen: NAD, resting comfortably  Ankle: No visible erythema or swelling (other than chronic pretibial 1+ edema) Range of motion is full in all directions. Strength is 5/5 in all directions. Stable lateral and medial ligaments;  No tenderness on posterior aspects of  lateral and medial malleolus Able to walk 4 steps.  Able to do heel raises but has pain with even first heel raise. Previously had pain more on medial side of both heels but now pain is midline and cannot reproduce with palpation as easily as with heel raise.   Assessment/Plan:  Bilateral Heel Pain At last visit thought to be retrocalcaneal bursitis by exam but this portion has improved. I am concerned about achilles tendonitis more and this did not respond to heel cup,icing, diclofenac scheduled x 10 days, home exercise program.   Given lack of improvement, refer to sports medicine for further evaluation/management.

## 2014-06-09 ENCOUNTER — Other Ambulatory Visit: Payer: Self-pay | Admitting: Family Medicine

## 2014-06-09 ENCOUNTER — Other Ambulatory Visit: Payer: Self-pay

## 2014-06-20 ENCOUNTER — Other Ambulatory Visit (INDEPENDENT_AMBULATORY_CARE_PROVIDER_SITE_OTHER): Payer: BC Managed Care – PPO

## 2014-06-20 ENCOUNTER — Ambulatory Visit (INDEPENDENT_AMBULATORY_CARE_PROVIDER_SITE_OTHER): Payer: BC Managed Care – PPO | Admitting: Family Medicine

## 2014-06-20 ENCOUNTER — Encounter: Payer: Self-pay | Admitting: *Deleted

## 2014-06-20 ENCOUNTER — Encounter: Payer: Self-pay | Admitting: Family Medicine

## 2014-06-20 VITALS — BP 140/82 | HR 77 | Ht 75.0 in | Wt 336.0 lb

## 2014-06-20 DIAGNOSIS — M79672 Pain in left foot: Secondary | ICD-10-CM

## 2014-06-20 DIAGNOSIS — M79671 Pain in right foot: Secondary | ICD-10-CM

## 2014-06-20 DIAGNOSIS — M7662 Achilles tendinitis, left leg: Secondary | ICD-10-CM

## 2014-06-20 DIAGNOSIS — M7661 Achilles tendinitis, right leg: Secondary | ICD-10-CM

## 2014-06-20 DIAGNOSIS — M2170 Unequal limb length (acquired), unspecified site: Secondary | ICD-10-CM

## 2014-06-20 MED ORDER — NITROGLYCERIN 0.2 MG/HR TD PT24: MEDICATED_PATCH | TRANSDERMAL | Status: DC

## 2014-06-20 MED ORDER — PREDNISONE 50 MG PO TABS: 50.0000 mg | ORAL_TABLET | Freq: Every day | ORAL | Status: DC

## 2014-06-20 NOTE — Patient Instructions (Signed)
Very nice to meet you.  Ice bath 20 minutes at night Wear heel lift in right shoe only.  ON step drop BOTH heels then up on toes hold for 2 seconds and then down slow for count of 4 seconds.  Prednisone for next 5 days  Vitamin D 4000 IU daily Seated work only Nitroglycerin Protocol   Apply 1/4 nitroglycerin patch to affected area daily.  Change position of patch within the affected area every 24 hours.  You may experience a headache during the first 1-2 weeks of using the patch, these should subside.  If you experience headaches after beginning nitroglycerin patch treatment, you may take your preferred over the counter pain reliever.  Another side effect of the nitroglycerin patch is skin irritation or rash related to patch adhesive.  Please notify our office if you develop more severe headaches or rash, and stop the patch.  Tendon healing with nitroglycerin patch may require 12 to 24 weeks depending on the extent of injury.  Men should not use if taking Viagra, Cialis, or Levitra.   Do not use if you have migraines or rosacea.  See me again in 2 weeks.

## 2014-06-20 NOTE — Assessment & Plan Note (Signed)
Patient does have significant calcific changes of the Achilles bilaterally. We discussed how this may take a little bit longer to heal. Patient was given home exercises, and patient will put a heel lift only in the right shoe due to the leg length discrepancy. We discussed icing protocol as well as nitroglycerin. Patient was warned of potential side effects and I think will respond well. Patient will call if any side effects. We discussed also a short course of prednisone due to patient being in such severe pain. Patient was put on seated work only at work for the next 2 weeks. Patient and will follow-up in 2 weeks to make sure that he improves. Patient has not made any improvement we need to consider a Cam Walker versus possible tendon injection but this would put patient on light duty for 1-2 more weeks. We will discuss at follow-up.

## 2014-06-20 NOTE — Progress Notes (Signed)
[name] [place], [place] [address] Phone: [phone] Subjective:    I'm seeing this patient by the request  of:  Garret Reddish, MD   CC: Bilateral heel pain.  [id number] Donald [name] is a 58 y.o. male coming in with complaint of bilateral heel pain. Patient has been diagnosed with Achilles tendinitis previously. Patient has tried to do heel cups, some icing, home exercises as well as oral anti-inflammatories without any significant improvement. Patient describes as more of a dull aching pain that can be very sharp and very painful with ambulation. Patient has also tried over-the-counter orthotics without any significant improvement. Patient rates the severity of pain is 8 out of 10. Seems as if he rests it seems to do better. Patient states that this is affecting his activities of daily living and is  as well as affecting his job. Denies any nighttime awakening.    Past medical history, social, surgical and family history all reviewed in electronic medical record.   Review of Systems: No headache, visual changes, nausea, vomiting, diarrhea, constipation, dizziness, abdominal pain, skin rash, fevers, chills, night sweats, weight loss, swollen lymph nodes, body aches, joint swelling, muscle aches, chest pain, shortness of breath, mood changes.   Objective Blood pressure 140/82, pulse 77, height 6\' 3"  (1.905 m), weight 336 lb (152.409 kg), SpO2 97 %.  General: No apparent distress alert and oriented x3 mood and affect normal, dressed appropriately.  HEENT: Pupils equal, extraocular movements intact  Respiratory: Patient's speak in full sentences and does not appear short of breath  Cardiovascular: No lower extremity edema, non tender, no erythema  Skin: Warm dry intact with no signs of infection or rash on extremities or on axial skeleton.  Abdomen: Soft nontender  Neuro: Cranial nerves II through XII are intact, neurovascularly intact  in all extremities with 2+ DTRs and 2+ pulses.  Lymph: No lymphadenopathy of posterior or anterior cervical chain or axillae bilaterally.  Gait normal with good balance and coordination.  MSK:  Non tender with full range of motion and good stability and symmetric strength and tone of shoulders, elbows, wrist, hip, knee and bilaterally.  Ankle: Bilateral Haglund nodules noted bilaterally with supination of the hindfoot. Range of motion is full in all directions. Strength is 5/5 in all directions. Stable lateral and medial ligaments; squeeze test and kleiger test unremarkable; Talar dome nontender; No pain at base of 5th MT; No tenderness over cuboid; No tenderness over N spot or navicular prominence No tenderness on posterior aspects of lateral and medial malleolus No sign of peroneal tendon subluxations or tenderness to palpation Mild tenderness at the insertion of the Achilles bilaterally right greater than left Negative tarsal tunnel tinel's Able to walk 4 steps.  MSK US performed of: Bilateral This study was ordered, performed, and interpreted by Charlann Boxer D.O.  Foot/Ankle:   All structures visualized.   Talar dome unremarkable  Ankle mortise without effusion. Peroneus longus and brevis tendons unremarkable on long and transverse views without sheath effusions. Posterior tibialis, flexor hallucis longus, and flexor digitorum longus tendons unremarkable on long and transverse views without sheath effusions. Achilles tendon  has significant hypoechoic changes as well as calcific deposits Anterior Talofibular Ligament and Calcaneofibular Ligaments unremarkable and intact. Deltoid Ligament unremarkable and intact. Plantar fascia intact and without effusion, normal thickness. No increased doppler signal, cap sign, or thickening of tibial cortex. Power doppler signal normal.  IMPRESSION:  Calcific Achilles tendinosis right greater than left  Impression and Recommendations:       This case required medical decision making of moderate complexity.

## 2014-07-07 ENCOUNTER — Other Ambulatory Visit: Payer: Self-pay | Admitting: Family Medicine

## 2014-07-08 ENCOUNTER — Encounter: Payer: Self-pay | Admitting: Family Medicine

## 2014-07-08 ENCOUNTER — Other Ambulatory Visit (INDEPENDENT_AMBULATORY_CARE_PROVIDER_SITE_OTHER): Payer: BLUE CROSS/BLUE SHIELD

## 2014-07-08 ENCOUNTER — Ambulatory Visit (INDEPENDENT_AMBULATORY_CARE_PROVIDER_SITE_OTHER): Payer: BLUE CROSS/BLUE SHIELD | Admitting: Family Medicine

## 2014-07-08 ENCOUNTER — Encounter: Payer: Self-pay | Admitting: *Deleted

## 2014-07-08 VITALS — BP 142/88 | HR 73 | Ht 75.0 in

## 2014-07-08 DIAGNOSIS — M79672 Pain in left foot: Secondary | ICD-10-CM

## 2014-07-08 DIAGNOSIS — M79671 Pain in right foot: Secondary | ICD-10-CM

## 2014-07-08 DIAGNOSIS — M7661 Achilles tendinitis, right leg: Secondary | ICD-10-CM

## 2014-07-08 DIAGNOSIS — M7662 Achilles tendinitis, left leg: Secondary | ICD-10-CM

## 2014-07-08 MED ORDER — NITROGLYCERIN 0.2 MG/HR TD PT24: MEDICATED_PATCH | TRANSDERMAL | Status: DC

## 2014-07-08 MED ORDER — ALLOPURINOL 100 MG PO TABS: 200.0000 mg | ORAL_TABLET | Freq: Every day | ORAL | Status: DC

## 2014-07-08 MED ORDER — ALLOPURINOL 100 MG PO TABS: 100.0000 mg | ORAL_TABLET | Freq: Every day | ORAL | Status: DC

## 2014-07-08 NOTE — Patient Instructions (Addendum)
Good to see you Ice is still your friend.  Continue the exercises.  Continue light duty for next week Allopurinol daily for 1 week then 2 pills daily thereafter.  Ok to take diclofenac with this medicine if needed.  Conitnue the nitro.  See me in 2 weeks.

## 2014-07-08 NOTE — Progress Notes (Signed)
  [name] [place], [place] [address] Phone: [phone] Subjective:     CC: Bilateral heel pain.  [id number] Donald [name] is a 59 y.o. male coming in with complaint of bilateral heel pain. She was diagnosed with Achilles tendinitis. Patient also had significant calcific changes. Patient has a history of gout. He is doing a nitroglycerin patch on a regular basis and states that this has helped out tremendously with no side effects. Patient is able to do activities of daily living without any pain at this time. Patient states the right side seems to be almost nonpainful on the left side minimal. .    Past medical history, social, surgical and family history all reviewed in electronic medical record.   Review of Systems: No headache, visual changes, nausea, vomiting, diarrhea, constipation, dizziness, abdominal pain, skin rash, fevers, chills, night sweats, weight loss, swollen lymph nodes, body aches, joint swelling, muscle aches, chest pain, shortness of breath, mood changes.   Objective Blood pressure 142/88, pulse 73, height 6\' 3"  (1.905 m), weight 0 lb (0 kg), SpO2 99 %.  General: No apparent distress alert and oriented x3 mood and affect normal, dressed appropriately.  HEENT: Pupils equal, extraocular movements intact  Respiratory: Patient's speak in full sentences and does not appear short of breath  Cardiovascular: No lower extremity edema, non tender, no erythema  Skin: Warm dry intact with no signs of infection or rash on extremities or on axial skeleton.  Abdomen: Soft nontender  Neuro: Cranial nerves II through XII are intact, neurovascularly intact in all extremities with 2+ DTRs and 2+ pulses.  Lymph: No lymphadenopathy of posterior or anterior cervical chain or axillae bilaterally.  Gait normal with good balance and coordination.  MSK:  Non tender with full range of motion and good stability and symmetric strength and tone of  shoulders, elbows, wrist, hip, knee and bilaterally.  Ankle: Bilateral Haglund nodules noted bilaterally with supination of the hindfoot. Range of motion is full in all directions. Strength is 5/5 in all directions. Stable lateral and medial ligaments; squeeze test and kleiger test unremarkable; Talar dome nontender; No pain at base of 5th MT; No tenderness over cuboid; No tenderness over N spot or navicular prominence No tenderness on posterior aspects of lateral and medial malleolus No sign of peroneal tendon subluxations or tenderness to palpation Nontender on exam today. Around the Achilles. Negative tarsal tunnel tinel's Able to walk 4 steps.  MSK US performed of: Bilateral This study was ordered, performed, and interpreted by Charlann Boxer D.O.  Foot/Ankle:   All structures visualized.   Talar dome unremarkable  Ankle mortise without effusion. Peroneus longus and brevis tendons unremarkable on long and transverse views without sheath effusions. Posterior tibialis, flexor hallucis longus, and flexor digitorum longus tendons unremarkable on long and transverse views without sheath effusions. Achilles tendon  has significant hypoechoic changes as well as calcific deposits but improved from previous exam. Anterior Talofibular Ligament and Calcaneofibular Ligaments unremarkable and intact. Deltoid Ligament unremarkable and intact. Plantar fascia intact and without effusion, normal thickness. No increased doppler signal, cap sign, or thickening of tibial cortex. Power doppler signal normal.  IMPRESSION:  Calcific Achilles tendinosis right greater than left      Impression and Recommendations:     This case required medical decision making of moderate complexity.

## 2014-07-08 NOTE — Assessment & Plan Note (Signed)
Patient is responding well to conservative therapy. Encourage patient to do the exercises on a more regular routine. I also went allopurinol daily encased this is more of gouty deposits. We discussed the importance of the icing regimen which patient will do. We discussed the possibility of referral to formal physical therapy which patient declined. Patient will do 2 more weeks of light duty. Discussed proper shoewear. Patient will come back in 2 weeks for further evaluation.

## 2014-07-09 ENCOUNTER — Other Ambulatory Visit: Payer: Self-pay | Admitting: Internal Medicine

## 2014-07-22 ENCOUNTER — Ambulatory Visit: Payer: BLUE CROSS/BLUE SHIELD | Admitting: Family Medicine

## 2014-07-29 ENCOUNTER — Encounter: Payer: Self-pay | Admitting: *Deleted

## 2014-07-29 ENCOUNTER — Ambulatory Visit (INDEPENDENT_AMBULATORY_CARE_PROVIDER_SITE_OTHER): Payer: BLUE CROSS/BLUE SHIELD | Admitting: Family Medicine

## 2014-07-29 ENCOUNTER — Other Ambulatory Visit (INDEPENDENT_AMBULATORY_CARE_PROVIDER_SITE_OTHER): Payer: BLUE CROSS/BLUE SHIELD

## 2014-07-29 ENCOUNTER — Encounter: Payer: Self-pay | Admitting: Family Medicine

## 2014-07-29 VITALS — BP 130/86 | HR 80 | Ht 75.0 in

## 2014-07-29 DIAGNOSIS — M7750 Other enthesopathy of unspecified foot: Secondary | ICD-10-CM | POA: Insufficient documentation

## 2014-07-29 DIAGNOSIS — M79672 Pain in left foot: Secondary | ICD-10-CM

## 2014-07-29 DIAGNOSIS — M7752 Other enthesopathy of left foot: Secondary | ICD-10-CM

## 2014-07-29 NOTE — Progress Notes (Signed)
[name] [place], [place] [address] Phone: [phone] Subjective:     CC: Bilateral heel pain.  [id number] Donald [name] is a 59 y.o. male coming in with complaint of bilateral heel pain. She was diagnosed with Achilles tendinitis. Patient also had significant calcific changes. Patient has a history of gout. He is doing a nitroglycerin patch on a regular basis and states at last visit he was doing significantly better. Patient states his right side is significantly better. Patient is actually having no pain. Patient unfortunately states that he continues to have severe pain in the left side. Patient states that any type of walking causes more pain. No pain at rest. Patient has stopped the nitroglycerin because it is starting to give him headaches. No new symptoms is worsening on the left side.    Past medical history, social, surgical and family history all reviewed in electronic medical record.   Review of Systems: No headache, visual changes, nausea, vomiting, diarrhea, constipation, dizziness, abdominal pain, skin rash, fevers, chills, night sweats, weight loss, swollen lymph nodes, body aches, joint swelling, muscle aches, chest pain, shortness of breath, mood changes.   Objective Blood pressure 130/86, pulse 80, height 6\' 3"  (1.905 m), weight 0 lb (0 kg), SpO2 98 %.  General: No apparent distress alert and oriented x3 mood and affect normal, dressed appropriately.  HEENT: Pupils equal, extraocular movements intact  Respiratory: Patient's speak in full sentences and does not appear short of breath  Cardiovascular: No lower extremity edema, non tender, no erythema  Skin: Warm dry intact with no signs of infection or rash on extremities or on axial skeleton.  Abdomen: Soft nontender  Neuro: Cranial nerves II through XII are intact, neurovascularly intact in all extremities with 2+ DTRs and 2+ pulses.  Lymph: No lymphadenopathy of  posterior or anterior cervical chain or axillae bilaterally.  Gait normal with good balance and coordination.  MSK:  Non tender with full range of motion and good stability and symmetric strength and tone of shoulders, elbows, wrist, hip, knee and bilaterally.  Ankle: Bilateral Haglund nodules noted bilaterally with supination of the hindfoot. Range of motion is full in all directions. Strength is 5/5 in all directions. Stable lateral and medial ligaments; squeeze test and kleiger test unremarkable; Talar dome nontender; No pain at base of 5th MT; No tenderness over cuboid; No tenderness over N spot or navicular prominence No tenderness on posterior aspects of lateral and medial malleolus No sign of peroneal tendon subluxations or tenderness to palpation Patient is severely tender to palpation around the retrocalcaneal area on the left side of the ankle. Negative tarsal tunnel tinel's Able to walk 4 steps.  MSK US performed of: Left This study was ordered, performed, and interpreted by Charlann Boxer D.O.  Foot/Ankle:   All structures visualized.   Talar dome unremarkable  Ankle mortise without effusion. Peroneus longus and brevis tendons unremarkable on long and transverse views without sheath effusions. Posterior tibialis, flexor hallucis longus, and flexor digitorum longus tendons unremarkable on long and transverse views without sheath effusions. Achilles tendon  on left side still has intersubstance tearing and hypoechoic changes but calcium deposits over seen previously and are improved. Patient doesn't does have what seems to be a very large calcific retrocalcaneal bursitis. Anterior Talofibular Ligament and Calcaneofibular Ligaments unremarkable and intact. Deltoid Ligament unremarkable and intact. Plantar fascia intact and without effusion, normal thickness. No increased doppler signal, cap sign, or thickening of tibial cortex.  Power doppler signal normal.  IMPRESSION:  Calcific  Achilles left side with retrocalcaneal bursitis   Procedure: Real-time Ultrasound Guided Injection of left-sided retrocalcaneal bursitis Device: GE Logiq E  Ultrasound guided injection is preferred based studies that show increased duration, increased effect, greater accuracy, decreased procedural pain, increased response rate, and decreased cost with ultrasound guided versus blind injection.  Verbal informed consent obtained.  Time-out conducted.  Noted no overlying erythema, induration, or other signs of local infection.  Skin prepped in a sterile fashion.  Local anesthesia: Topical Ethyl chloride.  With sterile technique and under real time ultrasound guidance: With a 25-gauge half-inch needle patient was injected with 0.5 mL of 0.5% Marcaine and 0.5 mL of Kenalog 40 mg/dL.  Completed without difficulty  Pain immediately resolved suggesting accurate placement of the medication.  Advised to call if fevers/chills, erythema, induration, drainage, or persistent bleeding.  Images permanently stored and available for review in the ultrasound unit.  Impression: Technically successful ultrasound guided injection.      Impression and Recommendations:     This case required medical decision making of moderate complexity.

## 2014-07-29 NOTE — Assessment & Plan Note (Signed)
Patient was given an injection today and tolerated the procedure very well. Patient was put in a Cam Walker boot for the next week here. We discussed icing regimen and home exercises decreasing for the next 72 hours and then again 3 times a week. We discussed topical anti-inflammatory and patient was given a trial size. Patient is to be put on seated work only for the next week and then light duty for 1 week. Patient and will follow-up with me again in 2 weeks to make sure that he is improving.

## 2014-07-29 NOTE — Patient Instructions (Addendum)
Good to see you Ice will be your friend when having some discomfort.  We tried an injection Wear the boot for 1 week.  Try pennsaid twice daily.  No exercises for 72 hours then start again 3 times a week.  See me in 2 weeks.

## 2014-07-29 NOTE — Progress Notes (Signed)
Pre visit review using our clinic review tool, if applicable. No additional management support is needed unless otherwise documented below in the visit note. 

## 2014-08-01 HISTORY — PX: ACHILLES TENDON REPAIR: SUR1153

## 2014-08-12 ENCOUNTER — Encounter: Payer: Self-pay | Admitting: Family Medicine

## 2014-08-12 ENCOUNTER — Ambulatory Visit (INDEPENDENT_AMBULATORY_CARE_PROVIDER_SITE_OTHER): Payer: BLUE CROSS/BLUE SHIELD | Admitting: Family Medicine

## 2014-08-12 ENCOUNTER — Encounter: Payer: Self-pay | Admitting: *Deleted

## 2014-08-12 VITALS — BP 142/82 | HR 78 | Ht 75.0 in

## 2014-08-12 DIAGNOSIS — M6528 Calcific tendinitis, other site: Secondary | ICD-10-CM

## 2014-08-12 MED ORDER — ALLOPURINOL 300 MG PO TABS: 300.0000 mg | ORAL_TABLET | Freq: Every day | ORAL | Status: DC

## 2014-08-12 NOTE — Progress Notes (Signed)
[name] [address] [place], [place] [address] Phone: [phone] Subjective:     CC: Bilateral heel pain.  NLG:Donald [name] is a 59 y.o. male coming in with complaint of bilateral heel pain. She was diagnosed with Achilles tendinitis. Patient also had significant calcific changes.  Patient has a history of gout and states that the allopurinol has been helpful. In addition of this though patient continues to have some mild left ankle pain even after the retrocalcaneal bursa injection. Still approximate 60  percent better. Still pain if doing a lot of activity.      Past medical history, social, surgical and family history all reviewed in electronic medical record.   Review of Systems: No headache, visual changes, nausea, vomiting, diarrhea, constipation, dizziness, abdominal pain, skin rash, fevers, chills, night sweats, weight loss, swollen lymph nodes, body aches, joint swelling, muscle aches, chest pain, shortness of breath, mood changes.   Objective Blood pressure 142/82, pulse 78, height 6\' 3"  (1.905 m), weight 0 lb (0 kg), SpO2 96 %.  General: No apparent distress alert and oriented x3 mood and affect normal, dressed appropriately.  HEENT: Pupils equal, extraocular movements intact  Respiratory: Patient's speak in full sentences and does not appear short of breath  Cardiovascular: No lower extremity edema, non tender, no erythema  Skin: Warm dry intact with no signs of infection or rash on extremities or on axial skeleton.  Abdomen: Soft nontender  Neuro: Cranial nerves II through XII are intact, neurovascularly intact in all extremities with 2+ DTRs and 2+ pulses.  Lymph: No lymphadenopathy of posterior or anterior cervical chain or axillae bilaterally.  Gait normal with good balance and coordination.  MSK:  Non tender with full range of motion and good stability and symmetric strength and tone of shoulders, elbows, wrist, hip, knee  and bilaterally.  Ankle: Bilateral Haglund nodules noted bilaterally with supination of the hindfoot. Range of motion is full in all directions. Strength is 5/5 in all directions. Stable lateral and medial ligaments; squeeze test and kleiger test unremarkable; Talar dome nontender; No pain at base of 5th MT; No tenderness over cuboid; No tenderness over N spot or navicular prominence No tenderness on posterior aspects of lateral and medial malleolus No sign of peroneal tendon subluxations or tenderness to palpation Significant less tender then previous exam.  Negative tarsal tunnel tinel's Able to walk 4 steps.  MSK US performed of: Left This study was ordered, performed, and interpreted by Charlann Boxer D.O.  Foot/Ankle:   All structures visualized.   Talar dome unremarkable  Ankle mortise without effusion. Peroneus longus and brevis tendons unremarkable on long and transverse views without sheath effusions. Posterior tibialis, flexor hallucis longus, and flexor digitorum longus tendons unremarkable on long and transverse views without sheath effusions. Achilles tendon  on left side still has no tearing and continued  changescalcium deposits. Retro-calcaneal bursa seems to be significantly healed.. Anterior Talofibular Ligament and Calcaneofibular Ligaments unremarkable and intact. Deltoid Ligament unremarkable and intact. Plantar fascia intact and without effusion, normal thickness. No increased doppler signal, cap sign, or thickening of tibial cortex. Power doppler signal normal.  IMPRESSION:  Calcific Achilles still present with resolved retrocalcaneal bursitis     Impression and Recommendations:     This case required medical decision making of moderate complexity.  PLAN: Patient will do one more week of being in the [facility] for the calcific Achilles tendinosis. We will increase patient's allopurinol in case this is  contributing. I consider the possibility of having  patient get labs to evaluate for any other reason for patient have calcific deposits. Encourage patient to do exercises more frequently. Patient declined physical therapy. Patient will try these different changes and come back again in 4 weeks. We will see patient though in 2 weeks to try to do a custom orthotics for the chronic Achilles tendinosis.

## 2014-08-12 NOTE — Patient Instructions (Signed)
Good to see you Increase allopurinol 300mg  daily Ice is still your friend Boot for another week.  We will get you set up for orthotics. This will be in 2 weeks.  After the orthotics I would like to see you 2 weeks after that.

## 2014-08-12 NOTE — Progress Notes (Signed)
Pre visit review using our clinic review tool, if applicable. No additional management support is needed unless otherwise documented below in the visit note. 

## 2014-08-18 ENCOUNTER — Encounter: Payer: Self-pay | Admitting: Family Medicine

## 2014-08-18 ENCOUNTER — Other Ambulatory Visit (INDEPENDENT_AMBULATORY_CARE_PROVIDER_SITE_OTHER): Payer: BLUE CROSS/BLUE SHIELD

## 2014-08-18 ENCOUNTER — Ambulatory Visit (INDEPENDENT_AMBULATORY_CARE_PROVIDER_SITE_OTHER): Payer: BLUE CROSS/BLUE SHIELD | Admitting: Family Medicine

## 2014-08-18 VITALS — BP 136/82 | HR 72 | Ht 75.0 in

## 2014-08-18 DIAGNOSIS — S86012A Strain of left Achilles tendon, initial encounter: Secondary | ICD-10-CM | POA: Insufficient documentation

## 2014-08-18 DIAGNOSIS — M79672 Pain in left foot: Secondary | ICD-10-CM

## 2014-08-18 MED ORDER — HYDROCODONE-ACETAMINOPHEN 7.5-325 MG PO TABS: 1.0000 | ORAL_TABLET | Freq: Three times a day (TID) | ORAL | Status: DC | PRN

## 2014-08-18 NOTE — Progress Notes (Signed)
Pre visit review using our clinic review tool, if applicable. No additional management support is needed unless otherwise documented below in the visit note. 

## 2014-08-18 NOTE — Patient Instructions (Addendum)
Good to see you Im sorry this happen In my opinion probably should get it fixed.  Ice will help for now.  Walk a you can stand.  245pm today with Dr. Berenice Primas

## 2014-08-18 NOTE — Assessment & Plan Note (Signed)
Patient was being treated for calcific Achilles tendinosis for quite some time. Patient also was treated for a retrocalcaneal bursa. Patient unfortunately continued to have the pain and was improving and did increase activity going up and down stairs and patient states though most jumping. Partially this did cause the rupture. Patient has a full-thickness tear and I think best treatment for him would be possibly surgical repair. Patient will go follow-up with orthopedic surgeon. Patient has an appointment with Dr. Berenice Primas at 245 today. They will discuss different treatment options. Patient told that he can wear the shoe or a Cam Walker whatever is more comfortable and we discussed trying to avoid weightbearing. Patient has difficulty because he does have severe osteoarthritis of the right hip which she was supposed to be having repaired in May.  Patient will call call if he has any questions.

## 2014-08-18 NOTE — Progress Notes (Signed)
[name] [address], [place] [address] Phone: [phone] Subjective:     CC: Left Achilles tendinitis follow-up  Donald [name]:[id number] Donald [name] is a 59 y.o. male coming in with complaint of bilateral heel pain. He was diagnosed with Achilles tendinitis. Patient also had significant calcific changes.  Patient has a history of gout and states that the allopurinol has been helpful. In addition of this though patient continues to have some mild left ankle pain even after the retrocalcaneal bursa injection. Patient was seen one week ago and was doing approximate 60 better 2 weeks after the injection. Patient states he was going up the stairs and he was actually jumping up the stairs. Patient felt a significant amount pain in his calf and had an audible pop. Patient had pain immediately but then it seemed to resolve after minutes. Did have some swelling. This did occur yesterday. Patient is able to ambulate and states that it feels somewhat better but he is unable to pick up his toes. Patient states that it is more of a cramping sensation in the calf.    Past medical history, social, surgical and family history all reviewed in electronic medical record.   Review of Systems: No headache, visual changes, nausea, vomiting, diarrhea, constipation, dizziness, abdominal pain, skin rash, fevers, chills, night sweats, weight loss, swollen lymph nodes, body aches, joint swelling, muscle aches, chest pain, shortness of breath, mood changes.   Objective Blood pressure 136/82, pulse 72, height 6\' 3"  (1.905 m).  General: No apparent distress alert and oriented x3 mood and affect normal, dressed appropriately.  HEENT: Pupils equal, extraocular movements intact  Respiratory: Patient's speak in full sentences and does not appear short of breath  Cardiovascular: No lower extremity edema, non tender, no erythema  Skin: Warm dry intact with no signs of infection or rash on  extremities or on axial skeleton.  Abdomen: Soft nontender  Neuro: Cranial nerves II through XII are intact, neurovascularly intact in all extremities with 2+ DTRs and 2+ pulses.  Lymph: No lymphadenopathy of posterior or anterior cervical chain or axillae bilaterally.  Gait normal with good balance and coordination.  MSK:  Non tender with full range of motion and good stability and symmetric strength and tone of shoulders, elbows, wrist, hip, knee and bilaterally.  Ankle: Left Haglund nodules noted bilaterally with supination of the hindfoot. Patient though does have a palpable defect just proximal to the calcaneal region of the Achilles. Patient still has full motion of the ankle Significant decrease in plantar flexion and dorsiflexion compared to the contralateral side and strength Stable lateral and medial ligaments; squeeze test and kleiger test unremarkable; Talar dome nontender; No pain at base of 5th MT; No tenderness over cuboid; No tenderness over N spot or navicular prominence No tenderness on posterior aspects of lateral and medial malleolus No sign of peroneal tendon subluxations or tenderness to palpation Significant less tender then previous exam.  Negative tarsal tunnel tinel's Able to walk 4 steps. Positive [place] Silos test  MSK US performed of: Left This study was ordered, performed, and interpreted by Charlann Boxer D.O.  Foot/Ankle:   Achilles tendon  on left side shows patient does have a full-thickness tear with significant retraction. Patient has approximately 20% of the tendon approximately 2.5 cm retracted and patient has even more retraction of the mass majority of the Achilles tendon. Initially and hypoechoic changes noted.  Power doppler signal normal.  IMPRESSION:  Ruptured Achilles with retraction  Impression and Recommendations:     This case required medical decision making of moderate complexity.  PLAN:

## 2014-08-26 ENCOUNTER — Ambulatory Visit: Payer: BLUE CROSS/BLUE SHIELD | Admitting: Family Medicine

## 2014-09-09 ENCOUNTER — Ambulatory Visit: Payer: BLUE CROSS/BLUE SHIELD | Admitting: Family Medicine

## 2014-10-05 ENCOUNTER — Other Ambulatory Visit: Payer: Self-pay | Admitting: Family Medicine

## 2014-10-14 ENCOUNTER — Ambulatory Visit: Payer: BC Managed Care – PPO | Admitting: Family Medicine

## 2014-11-04 ENCOUNTER — Other Ambulatory Visit: Payer: Self-pay | Admitting: Internal Medicine

## 2014-12-30 ENCOUNTER — Other Ambulatory Visit: Payer: Self-pay | Admitting: Family Medicine

## 2015-01-12 ENCOUNTER — Other Ambulatory Visit (HOSPITAL_COMMUNITY): Payer: Self-pay | Admitting: Orthopedic Surgery

## 2015-01-12 ENCOUNTER — Ambulatory Visit (HOSPITAL_COMMUNITY)
Admission: RE | Admit: 2015-01-12 | Discharge: 2015-01-12 | Disposition: A | Discharge status: Home / Self Care | Payer: BLUE CROSS/BLUE SHIELD | Source: Ambulatory Visit | Attending: Family Medicine | Admitting: Family Medicine

## 2015-01-12 DIAGNOSIS — R609 Edema, unspecified: Secondary | ICD-10-CM

## 2015-01-12 DIAGNOSIS — M7989 Other specified soft tissue disorders: Secondary | ICD-10-CM | POA: Diagnosis present

## 2015-01-12 DIAGNOSIS — M79605 Pain in left leg: Secondary | ICD-10-CM | POA: Insufficient documentation

## 2015-01-12 NOTE — Progress Notes (Addendum)
PRELIMINARY RESULTS BY TECH - Left Lower ext. Venous Duplex Completed. Negative for deep and superficial vein thrombosis in the left lower extremity. Negative for Baker's cyst.  Results given to Dr. Berenice Primas' office.  Oda Cogan, BS, RDMS, RVT

## 2015-02-05 ENCOUNTER — Other Ambulatory Visit: Payer: Self-pay | Admitting: Family Medicine

## 2015-03-23 ENCOUNTER — Telehealth: Payer: Self-pay | Admitting: Family Medicine

## 2015-03-23 DIAGNOSIS — R739 Hyperglycemia, unspecified: Secondary | ICD-10-CM

## 2015-03-23 NOTE — Telephone Encounter (Signed)
You can order a1c under hyperglycemia. Have him set up for a future establish visit but I will help out with this without a visit- we can simply follow up the lab since that is all that is requested

## 2015-03-23 NOTE — Telephone Encounter (Signed)
Lab order entered, ok to schedule pt for lab visit and future EST visit with [facility].

## 2015-03-23 NOTE — Telephone Encounter (Signed)
Pt has seen you a couple of times before his establish appt.  However, in April he did cancel his est appt.  Pt states he was having surgery at that time.  Pt states his ortho md, Dr Berenice Primas wants him to have his A1C/blood sugars checked b/c his achilles tendon and left foot continues to swell. To rule out DM. Pt wants appt 9/30 b/c he is off work that day. You too are off that day. Would you have pt see another provider who could order that test, or do you want to order and then see pt for Another est appt and fu on those labs?  FYI: Pt has seen you and Dr Charlann Boxer for this same ongoing  issue.

## 2015-03-27 NOTE — Telephone Encounter (Signed)
Pt aware.

## 2015-03-31 ENCOUNTER — Other Ambulatory Visit (INDEPENDENT_AMBULATORY_CARE_PROVIDER_SITE_OTHER): Payer: BLUE CROSS/BLUE SHIELD

## 2015-03-31 ENCOUNTER — Ambulatory Visit (INDEPENDENT_AMBULATORY_CARE_PROVIDER_SITE_OTHER): Payer: BLUE CROSS/BLUE SHIELD | Admitting: *Deleted

## 2015-03-31 ENCOUNTER — Ambulatory Visit: Payer: BLUE CROSS/BLUE SHIELD | Admitting: Adult Health

## 2015-03-31 DIAGNOSIS — Z23 Encounter for immunization: Secondary | ICD-10-CM

## 2015-03-31 DIAGNOSIS — R739 Hyperglycemia, unspecified: Secondary | ICD-10-CM

## 2015-03-31 LAB — HEMOGLOBIN A1C: HEMOGLOBIN A1C: 5.7 % (ref 4.6–6.5)

## 2015-04-03 ENCOUNTER — Other Ambulatory Visit: Payer: Self-pay | Admitting: Orthopedic Surgery

## 2015-04-06 ENCOUNTER — Other Ambulatory Visit: Payer: Self-pay | Admitting: Family Medicine

## 2015-04-06 NOTE — Telephone Encounter (Signed)
Refill done.  

## 2015-04-07 ENCOUNTER — Encounter (HOSPITAL_COMMUNITY)
Admission: RE | Admit: 2015-04-07 | Discharge: 2015-04-07 | Disposition: A | Discharge status: Home / Self Care | Payer: BLUE CROSS/BLUE SHIELD | Source: Ambulatory Visit | Attending: Orthopedic Surgery | Admitting: Orthopedic Surgery

## 2015-04-07 ENCOUNTER — Encounter (HOSPITAL_COMMUNITY): Payer: Self-pay

## 2015-04-07 DIAGNOSIS — Z01818 Encounter for other preprocedural examination: Secondary | ICD-10-CM | POA: Diagnosis not present

## 2015-04-07 DIAGNOSIS — I10 Essential (primary) hypertension: Secondary | ICD-10-CM | POA: Insufficient documentation

## 2015-04-07 DIAGNOSIS — M65172 Other infective (teno)synovitis, left ankle and foot: Secondary | ICD-10-CM | POA: Insufficient documentation

## 2015-04-07 LAB — URINALYSIS, ROUTINE W REFLEX MICROSCOPIC
Bilirubin Urine: NEGATIVE
Glucose, UA: NEGATIVE mg/dL
HGB URINE DIPSTICK: NEGATIVE
Ketones, ur: NEGATIVE mg/dL
Leukocytes, UA: NEGATIVE
Nitrite: NEGATIVE
PH: 5.5 (ref 5.0–8.0)
Protein, ur: NEGATIVE mg/dL
SPECIFIC GRAVITY, URINE: 1.006 (ref 1.005–1.030)
UROBILINOGEN UA: 0.2 mg/dL (ref 0.0–1.0)

## 2015-04-07 LAB — COMPREHENSIVE METABOLIC PANEL
ALBUMIN: 3.7 g/dL (ref 3.5–5.0)
ALK PHOS: 52 U/L (ref 38–126)
ALT: 26 U/L (ref 17–63)
AST: 37 U/L (ref 15–41)
Anion gap: 10 (ref 5–15)
BUN: 12 mg/dL (ref 6–20)
CALCIUM: 9.1 mg/dL (ref 8.9–10.3)
CHLORIDE: 103 mmol/L (ref 101–111)
CO2: 24 mmol/L (ref 22–32)
CREATININE: 0.94 mg/dL (ref 0.61–1.24)
GFR calc non Af Amer: >60 mL/min (ref >60)
GLUCOSE: 95 mg/dL (ref 65–99)
Potassium: 4.9 mmol/L (ref 3.5–5.1)
SODIUM: 137 mmol/L (ref 135–145)
Total Bilirubin: 1.2 mg/dL (ref 0.3–1.2)
Total Protein: 7 g/dL (ref 6.5–8.1)

## 2015-04-07 LAB — CBC WITH DIFFERENTIAL/PLATELET
BASOS ABS: 0 10*3/uL (ref 0.0–0.1)
BASOS PCT: 1 %
EOS PCT: 4 %
Eosinophils Absolute: 0.2 10*3/uL (ref 0.0–0.7)
HCT: 38 % — ABNORMAL LOW (ref 39.0–52.0)
HEMOGLOBIN: 12.6 g/dL — AB (ref 13.0–17.0)
LYMPHS ABS: 2.7 10*3/uL (ref 0.7–4.0)
Lymphocytes Relative: 43 %
MCH: 30 pg (ref 26.0–34.0)
MCHC: 33.2 g/dL (ref 30.0–36.0)
MCV: 90.5 fL (ref 78.0–100.0)
Monocytes Absolute: 0.6 10*3/uL (ref 0.1–1.0)
Monocytes Relative: 9 %
NEUTROS PCT: 43 %
Neutro Abs: 2.8 10*3/uL (ref 1.7–7.7)
PLATELETS: 364 10*3/uL (ref 150–400)
RBC: 4.2 MIL/uL — ABNORMAL LOW (ref 4.22–5.81)
RDW: 13.3 % (ref 11.5–15.5)
WBC: 6.3 10*3/uL (ref 4.0–10.5)

## 2015-04-07 MED ORDER — CHLORHEXIDINE GLUCONATE 4 % EX LIQD: 60.0000 mL | Freq: Once | CUTANEOUS | Status: DC

## 2015-04-07 NOTE — Pre-Procedure Instructions (Addendum)
    MARKIES MOWATT  04/07/2015      [id number] [place], [place]  [address] Phone: [phone] Fax: [phone]    Your procedure is scheduled on April 12, 2015.  Report to [facility] Admitting at 1:50 P.M.  Call this number if you have problems the morning of surgery:  [phone]   Remember:  Do not eat food or drink liquids after midnight.  Take these medicines the morning of surgery with A SIP OF WATER : allopurinol (ZYLOPRIM) , IF NEEDED:  PAIN MEDICATION   STOP ASPIRIN, HERBAL MEDICATIONS, DICLOFENAC, ADVIL AS OF TODAY   Do not wear jewelry.  Do not wear lotions, powders, or perfumes.  You may wear deodorant.  Men may shave face and neck.  Do not bring valuables to the hospital.  [facility] is not responsible for any belongings or valuables.  Contacts, dentures or bridgework may not be worn into surgery.  Leave your suitcase in the car.  After surgery it may be brought to your room.  For patients admitted to the hospital, discharge time will be determined by your treatment team.  Patients discharged the day of surgery will not be allowed to drive home.   Name and phone number of your driver:    Special instructions:  "PREPARING FOR SURGERY"  Please read over the following fact sheets that you were given. Pain Booklet, Coughing and Deep Breathing and Surgical Site Infection Prevention

## 2015-04-07 NOTE — Progress Notes (Signed)
This patient scored at an elevated risk for obstructive sleep apnea using the STOP BANG TOOL during a pre surgical testing 

## 2015-04-12 ENCOUNTER — Encounter (HOSPITAL_COMMUNITY): Payer: Self-pay

## 2015-04-12 ENCOUNTER — Inpatient Hospital Stay (HOSPITAL_COMMUNITY): Payer: BLUE CROSS/BLUE SHIELD | Admitting: Anesthesiology

## 2015-04-12 ENCOUNTER — Inpatient Hospital Stay (HOSPITAL_COMMUNITY)
Admission: RE | Admit: 2015-04-12 | Discharge: 2015-04-15 | DRG: 857 | Disposition: A | Discharge status: Home Health | Payer: BLUE CROSS/BLUE SHIELD | Source: Ambulatory Visit | Attending: Orthopedic Surgery | Admitting: Orthopedic Surgery

## 2015-04-12 ENCOUNTER — Encounter (HOSPITAL_COMMUNITY)
Admission: RE | Disposition: A | Discharge status: Home Health | Payer: Self-pay | Source: Ambulatory Visit | Attending: Orthopedic Surgery

## 2015-04-12 DIAGNOSIS — B9689 Other specified bacterial agents as the cause of diseases classified elsewhere: Secondary | ICD-10-CM | POA: Diagnosis present

## 2015-04-12 DIAGNOSIS — Y838 Other surgical procedures as the cause of abnormal reaction of the patient, or of later complication, without mention of misadventure at the time of the procedure: Secondary | ICD-10-CM | POA: Diagnosis present

## 2015-04-12 DIAGNOSIS — I10 Essential (primary) hypertension: Secondary | ICD-10-CM | POA: Diagnosis present

## 2015-04-12 DIAGNOSIS — Z9119 Patient's noncompliance with other medical treatment and regimen: Secondary | ICD-10-CM | POA: Diagnosis not present

## 2015-04-12 DIAGNOSIS — T814XXA Infection following a procedure, initial encounter: Principal | ICD-10-CM | POA: Diagnosis present

## 2015-04-12 DIAGNOSIS — T148XXA Other injury of unspecified body region, initial encounter: Secondary | ICD-10-CM

## 2015-04-12 DIAGNOSIS — Z79899 Other long term (current) drug therapy: Secondary | ICD-10-CM | POA: Diagnosis not present

## 2015-04-12 DIAGNOSIS — L24A9 Irritant contact dermatitis due friction or contact with other specified body fluids: Secondary | ICD-10-CM

## 2015-04-12 DIAGNOSIS — Z6841 Body Mass Index (BMI) 40.0 and over, adult: Secondary | ICD-10-CM | POA: Diagnosis not present

## 2015-04-12 DIAGNOSIS — T814XXS Infection following a procedure, sequela: Secondary | ICD-10-CM | POA: Diagnosis not present

## 2015-04-12 DIAGNOSIS — Z029 Encounter for administrative examinations, unspecified: Secondary | ICD-10-CM | POA: Insufficient documentation

## 2015-04-12 DIAGNOSIS — Z758 Other problems related to medical facilities and other health care: Secondary | ICD-10-CM | POA: Insufficient documentation

## 2015-04-12 DIAGNOSIS — T8189XA Other complications of procedures, not elsewhere classified, initial encounter: Secondary | ICD-10-CM | POA: Diagnosis present

## 2015-04-12 DIAGNOSIS — T8149XA Infection following a procedure, other surgical site, initial encounter: Secondary | ICD-10-CM

## 2015-04-12 DIAGNOSIS — A498 Other bacterial infections of unspecified site: Secondary | ICD-10-CM | POA: Insufficient documentation

## 2015-04-12 DIAGNOSIS — Z96652 Presence of left artificial knee joint: Secondary | ICD-10-CM | POA: Diagnosis present

## 2015-04-12 DIAGNOSIS — M7989 Other specified soft tissue disorders: Secondary | ICD-10-CM | POA: Diagnosis not present

## 2015-04-12 DIAGNOSIS — Z9889 Other specified postprocedural states: Secondary | ICD-10-CM

## 2015-04-12 HISTORY — PX: INCISION AND DRAINAGE OF WOUND: SHX1803

## 2015-04-12 HISTORY — PX: MINOR APPLICATION OF WOUND VAC: SHX6243

## 2015-04-12 SURGERY — IRRIGATION AND DEBRIDEMENT WOUND
Anesthesia: Regional | Site: Leg Lower | Laterality: Left

## 2015-04-12 MED ORDER — MIDAZOLAM HCL 2 MG/2ML IJ SOLN: 0.5000 mg | Freq: Once | INTRAMUSCULAR | Status: DC | PRN

## 2015-04-12 MED ORDER — TRAZODONE HCL 50 MG PO TABS
50.0000 mg | ORAL_TABLET | Freq: Every evening | ORAL | Status: DC | PRN
  Administered 2015-04-13 – 2015-04-15 (×2): 50 mg via ORAL
  Filled 2015-04-12 (×3): qty 1

## 2015-04-12 MED ORDER — HYDROMORPHONE HCL 1 MG/ML IJ SOLN
0.2500 mg | INTRAMUSCULAR | Status: DC | PRN
  Administered 2015-04-12: 0.5 mg via INTRAVENOUS

## 2015-04-12 MED ORDER — LOSARTAN POTASSIUM 50 MG PO TABS
100.0000 mg | ORAL_TABLET | Freq: Every day | ORAL | Status: DC
  Administered 2015-04-12 – 2015-04-15 (×4): 100 mg via ORAL
  Filled 2015-04-12 (×5): qty 2

## 2015-04-12 MED ORDER — PROPOFOL 10 MG/ML IV BOLUS
INTRAVENOUS | Status: DC | PRN
  Administered 2015-04-12: 50 mg via INTRAVENOUS
  Administered 2015-04-12: 150 mg via INTRAVENOUS
  Administered 2015-04-12: 100 mg via INTRAVENOUS

## 2015-04-12 MED ORDER — LIDOCAINE HCL (CARDIAC) 20 MG/ML IV SOLN
INTRAVENOUS | Status: DC | PRN
  Administered 2015-04-12: 40 mg via INTRAVENOUS

## 2015-04-12 MED ORDER — ONDANSETRON HCL 4 MG/2ML IJ SOLN
INTRAMUSCULAR | Status: AC
  Filled 2015-04-12: qty 2

## 2015-04-12 MED ORDER — DEXTROSE-NACL 5-0.45 % IV SOLN
INTRAVENOUS | Status: DC
  Administered 2015-04-14: 05:00:00 via INTRAVENOUS

## 2015-04-12 MED ORDER — MEPERIDINE HCL 25 MG/ML IJ SOLN: 6.2500 mg | INTRAMUSCULAR | Status: DC | PRN

## 2015-04-12 MED ORDER — ONDANSETRON HCL 4 MG/2ML IJ SOLN: 4.0000 mg | Freq: Four times a day (QID) | INTRAMUSCULAR | Status: DC | PRN

## 2015-04-12 MED ORDER — METHOCARBAMOL 500 MG PO TABS
500.0000 mg | ORAL_TABLET | Freq: Four times a day (QID) | ORAL | Status: DC | PRN
  Administered 2015-04-13 – 2015-04-15 (×4): 500 mg via ORAL
  Filled 2015-04-12 (×4): qty 1

## 2015-04-12 MED ORDER — CEFAZOLIN SODIUM-DEXTROSE 2-3 GM-% IV SOLR
2.0000 g | Freq: Three times a day (TID) | INTRAVENOUS | Status: DC
  Administered 2015-04-12 – 2015-04-13 (×3): 2 g via INTRAVENOUS
  Filled 2015-04-12 (×5): qty 50

## 2015-04-12 MED ORDER — PROPOFOL 10 MG/ML IV BOLUS
INTRAVENOUS | Status: AC
  Filled 2015-04-12: qty 20

## 2015-04-12 MED ORDER — HYDROMORPHONE HCL 1 MG/ML IJ SOLN
INTRAMUSCULAR | Status: AC
  Filled 2015-04-12: qty 1

## 2015-04-12 MED ORDER — FENTANYL CITRATE (PF) 250 MCG/5ML IJ SOLN
INTRAMUSCULAR | Status: AC
  Filled 2015-04-12: qty 5

## 2015-04-12 MED ORDER — ACETAMINOPHEN 325 MG PO TABS: 650.0000 mg | ORAL_TABLET | Freq: Four times a day (QID) | ORAL | Status: DC | PRN

## 2015-04-12 MED ORDER — SODIUM CHLORIDE 0.9 % IR SOLN
Status: DC | PRN
  Administered 2015-04-12: 3000 mL

## 2015-04-12 MED ORDER — LACTATED RINGERS IV SOLN
INTRAVENOUS | Status: DC
  Administered 2015-04-12 (×2): via INTRAVENOUS

## 2015-04-12 MED ORDER — DOCUSATE SODIUM 100 MG PO CAPS
100.0000 mg | ORAL_CAPSULE | Freq: Two times a day (BID) | ORAL | Status: DC
  Administered 2015-04-12 – 2015-04-15 (×6): 100 mg via ORAL
  Filled 2015-04-12 (×6): qty 1

## 2015-04-12 MED ORDER — MIDAZOLAM HCL 2 MG/2ML IJ SOLN
INTRAMUSCULAR | Status: AC
  Administered 2015-04-12: 2 mg
  Filled 2015-04-12: qty 2

## 2015-04-12 MED ORDER — METHOCARBAMOL 1000 MG/10ML IJ SOLN
500.0000 mg | Freq: Four times a day (QID) | INTRAVENOUS | Status: DC | PRN
  Filled 2015-04-12: qty 5

## 2015-04-12 MED ORDER — ONDANSETRON HCL 4 MG PO TABS: 4.0000 mg | ORAL_TABLET | Freq: Four times a day (QID) | ORAL | Status: DC | PRN

## 2015-04-12 MED ORDER — OXYCODONE-ACETAMINOPHEN 5-325 MG PO TABS
1.0000 | ORAL_TABLET | ORAL | Status: DC | PRN
  Administered 2015-04-12 – 2015-04-14 (×7): 2 via ORAL
  Administered 2015-04-14: 1 via ORAL
  Administered 2015-04-14 – 2015-04-15 (×3): 2 via ORAL
  Filled 2015-04-12 (×4): qty 2
  Filled 2015-04-12: qty 1
  Filled 2015-04-12 (×6): qty 2

## 2015-04-12 MED ORDER — SODIUM CHLORIDE 0.9 % IR SOLN
Status: DC | PRN
  Administered 2015-04-12: 1000 mL

## 2015-04-12 MED ORDER — HYDROMORPHONE HCL 1 MG/ML IJ SOLN
1.0000 mg | INTRAMUSCULAR | Status: DC | PRN
  Administered 2015-04-14: 1 mg via INTRAVENOUS
  Filled 2015-04-12: qty 1

## 2015-04-12 MED ORDER — FENTANYL CITRATE (PF) 100 MCG/2ML IJ SOLN
INTRAMUSCULAR | Status: AC
  Administered 2015-04-12: 100 ug
  Filled 2015-04-12: qty 2

## 2015-04-12 MED ORDER — SUCCINYLCHOLINE CHLORIDE 20 MG/ML IJ SOLN
INTRAMUSCULAR | Status: DC | PRN
  Administered 2015-04-12: 120 mg via INTRAVENOUS

## 2015-04-12 MED ORDER — SUGAMMADEX SODIUM 500 MG/5ML IV SOLN
INTRAVENOUS | Status: DC | PRN
  Administered 2015-04-12: 200 mg via INTRAVENOUS

## 2015-04-12 MED ORDER — CEFAZOLIN SODIUM 1-5 GM-% IV SOLN
INTRAVENOUS | Status: AC
  Administered 2015-04-12: 3 g via INTRAVENOUS
  Filled 2015-04-12: qty 50

## 2015-04-12 MED ORDER — ACETAMINOPHEN 650 MG RE SUPP: 650.0000 mg | Freq: Four times a day (QID) | RECTAL | Status: DC | PRN

## 2015-04-12 MED ORDER — PROMETHAZINE HCL 25 MG/ML IJ SOLN: 6.2500 mg | INTRAMUSCULAR | Status: DC | PRN

## 2015-04-12 MED ORDER — SUGAMMADEX SODIUM 200 MG/2ML IV SOLN
INTRAVENOUS | Status: AC
  Filled 2015-04-12: qty 2

## 2015-04-12 MED ORDER — ROCURONIUM BROMIDE 100 MG/10ML IV SOLN
INTRAVENOUS | Status: DC | PRN
  Administered 2015-04-12: 50 mg via INTRAVENOUS

## 2015-04-12 MED ORDER — ONDANSETRON HCL 4 MG/2ML IJ SOLN
INTRAMUSCULAR | Status: DC | PRN
  Administered 2015-04-12: 4 mg via INTRAVENOUS

## 2015-04-12 SURGICAL SUPPLY — 53 items
BAG DECANTER FOR FLEXI CONT (MISCELLANEOUS) IMPLANT
BANDAGE ELASTIC 4 VELCRO ST LF (GAUZE/BANDAGES/DRESSINGS) IMPLANT
BANDAGE ELASTIC 6 VELCRO ST LF (GAUZE/BANDAGES/DRESSINGS) ×3 IMPLANT
BANDAGE ESMARK 6X9 LF (GAUZE/BANDAGES/DRESSINGS) IMPLANT
BNDG COHESIVE 4X5 TAN STRL (GAUZE/BANDAGES/DRESSINGS) ×3 IMPLANT
BNDG ESMARK 6X9 LF (GAUZE/BANDAGES/DRESSINGS)
BNDG GAUZE ELAST 4 BULKY (GAUZE/BANDAGES/DRESSINGS) ×3 IMPLANT
COVER SURGICAL LIGHT HANDLE (MISCELLANEOUS) ×3 IMPLANT
CUFF TOURNIQUET SINGLE 18IN (TOURNIQUET CUFF) ×3 IMPLANT
CUFF TOURNIQUET SINGLE 24IN (TOURNIQUET CUFF) IMPLANT
CUFF TOURNIQUET SINGLE 34IN LL (TOURNIQUET CUFF) ×3 IMPLANT
CUFF TOURNIQUET SINGLE 44IN (TOURNIQUET CUFF) IMPLANT
DRAPE U-SHAPE 47X51 STRL (DRAPES) ×3 IMPLANT
DRSG ADAPTIC 3X8 NADH LF (GAUZE/BANDAGES/DRESSINGS) ×3 IMPLANT
DRSG EMULSION OIL 3X3 NADH (GAUZE/BANDAGES/DRESSINGS) ×3 IMPLANT
DRSG PAD ABDOMINAL 8X10 ST (GAUZE/BANDAGES/DRESSINGS) ×3 IMPLANT
DURAPREP 26ML APPLICATOR (WOUND CARE) ×3 IMPLANT
ELECT CAUTERY BLADE 6.4 (BLADE) IMPLANT
ELECT REM PT RETURN 9FT ADLT (ELECTROSURGICAL)
ELECTRODE REM PT RTRN 9FT ADLT (ELECTROSURGICAL) IMPLANT
FACESHIELD STD STERILE (MASK) ×3 IMPLANT
GAUZE SPONGE 4X4 12PLY STRL (GAUZE/BANDAGES/DRESSINGS) ×3 IMPLANT
GAUZE XEROFORM 1X8 LF (GAUZE/BANDAGES/DRESSINGS) ×3 IMPLANT
GLOVE BIOGEL PI IND STRL 8 (GLOVE) ×4 IMPLANT
GLOVE BIOGEL PI INDICATOR 8 (GLOVE) ×2
GLOVE ECLIPSE 7.5 STRL STRAW (GLOVE) ×6 IMPLANT
GOWN STRL REUS W/ TWL LRG LVL3 (GOWN DISPOSABLE) ×4 IMPLANT
GOWN STRL REUS W/ TWL XL LVL3 (GOWN DISPOSABLE) ×4 IMPLANT
GOWN STRL REUS W/TWL LRG LVL3 (GOWN DISPOSABLE) ×2
GOWN STRL REUS W/TWL XL LVL3 (GOWN DISPOSABLE) ×2
HANDPIECE INTERPULSE COAX TIP (DISPOSABLE)
KIT BASIN OR (CUSTOM PROCEDURE TRAY) ×3 IMPLANT
KIT PREVENA INCISION MGT 13 (CANNISTER) ×3 IMPLANT
KIT ROOM TURNOVER OR (KITS) ×3 IMPLANT
MANIFOLD NEPTUNE II (INSTRUMENTS) ×3 IMPLANT
NS IRRIG 1000ML POUR BTL (IV SOLUTION) ×3 IMPLANT
PACK ORTHO EXTREMITY (CUSTOM PROCEDURE TRAY) ×3 IMPLANT
PAD ARMBOARD 7.5X6 YLW CONV (MISCELLANEOUS) ×6 IMPLANT
PAD CAST 4YDX4 CTTN HI CHSV (CAST SUPPLIES) ×2 IMPLANT
PADDING CAST COTTON 4X4 STRL (CAST SUPPLIES) ×1
PADDING CAST COTTON 6X4 STRL (CAST SUPPLIES) ×3 IMPLANT
SET HNDPC FAN SPRY TIP SCT (DISPOSABLE) IMPLANT
SPLINT FIBERGLASS 4X30 (CAST SUPPLIES) ×3 IMPLANT
SPONGE LAP 18X18 X RAY DECT (DISPOSABLE) ×3 IMPLANT
STOCKINETTE IMPERVIOUS 9X36 MD (GAUZE/BANDAGES/DRESSINGS) IMPLANT
SUT ETHILON 2 0 PSLX (SUTURE) ×15 IMPLANT
TOWEL OR 17X24 6PK STRL BLUE (TOWEL DISPOSABLE) ×3 IMPLANT
TOWEL OR 17X26 10 PK STRL BLUE (TOWEL DISPOSABLE) ×3 IMPLANT
TUBE ANAEROBIC SPECIMEN COL (MISCELLANEOUS) IMPLANT
TUBE CONNECTING 12X1/4 (SUCTIONS) ×3 IMPLANT
UNDERPAD 30X30 INCONTINENT (UNDERPADS AND DIAPERS) ×3 IMPLANT
WATER STERILE IRR 1000ML POUR (IV SOLUTION) ×3 IMPLANT
YANKAUER SUCT BULB TIP NO VENT (SUCTIONS) ×6 IMPLANT

## 2015-04-12 NOTE — Anesthesia Procedure Notes (Addendum)
Anesthesia Regional Block:  Popliteal block  Pre-Anesthetic Checklist: ,, timeout performed, Correct Patient, Correct Site, Correct Laterality, Correct Procedure, Correct Position, site marked, Risks and benefits discussed, pre-op evaluation, post-op pain management  Laterality: Left  Prep: Maximum Sterile Barrier Precautions used and chloraprep       Needles:  Injection technique: Single-shot  Needle Type: Echogenic Stimulator Needle     Needle Length: 9cm 9 cm Needle Gauge: 21 and 21 G    Additional Needles:  Procedures: ultrasound guided (picture in chart) and nerve stimulator Popliteal block  Nerve Stimulator or Paresthesia:  Response: Tibial,   Additional Responses:   Narrative:  Start time: 04/12/2015 4:38 PM End time: 04/12/2015 4:48 PM Injection made incrementally with aspirations every 5 mL. Anesthesiologist: Roderic Palau  Additional Notes: 2% Lidocaine skin wheel.    Procedure Name: Intubation Date/Time: 04/12/2015 5:17 PM Performed by: Sampson Si E Pre-anesthesia Checklist: Patient identified, Emergency Drugs available, Suction available, Patient being monitored and Timeout performed Patient Re-evaluated:Patient Re-evaluated prior to inductionOxygen Delivery Method: Circle system utilized Preoxygenation: Pre-oxygenation with 100% oxygen Intubation Type: IV induction Ventilation: Mask ventilation without difficulty and Two handed mask ventilation required Laryngoscope Size: Glidescope Grade View: Grade I Tube type: Oral Tube size: 8.0 mm Number of attempts: 1 Airway Equipment and Method: Stylet and Video-laryngoscopy Placement Confirmation: ETT inserted through vocal cords under direct vision,  positive ETCO2 and breath sounds checked- equal and bilateral Secured at: 23 cm Tube secured with: Tape Dental Injury: Teeth and Oropharynx as per pre-operative assessment  Difficulty Due To: Difficulty was anticipated Future Recommendations:  Recommend- induction with short-acting agent, and alternative techniques readily available Comments: Elective Glide scope use d/t previous difficult intubation.

## 2015-04-12 NOTE — Brief Op Note (Signed)
04/12/2015  6:20 PM  PATIENT:  Donald [name]  59 y.o. male  PRE-OPERATIVE DIAGNOSIS:  infected achilles tendon  left  POST-OPERATIVE DIAGNOSIS:  * No post-op diagnosis entered *  PROCEDURE:  Procedure(s): DEBRIDEMENT OF ACHILLES TENDON LEFT  (Left)  SURGEON:  Surgeon(s) and Role:    * Dorna Leitz, MD - Primary  PHYSICIAN ASSISTANT:   ASSISTANTS: bethune   ANESTHESIA:   general  EBL:  Total I/O In: 1000 [I.V.:1000] Out: -   BLOOD ADMINISTERED:none  DRAINS: none   LOCAL MEDICATIONS USED:  NONE  SPECIMEN:  No Specimen  DISPOSITION OF SPECIMEN:  N/A  COUNTS:  YES  TOURNIQUET:  * Missing tourniquet times found for documented tourniquets in log:  [id number] *  DICTATION: .Other Dictation: Dictation Number [id number]  PLAN OF CARE: Admit to inpatient   PATIENT DISPOSITION:  PACU - hemodynamically stable.   Delay start of Pharmacological VTE agent (>24hrs) due to surgical blood loss or risk of bleeding: no

## 2015-04-12 NOTE — H&P (Signed)
PREOPERATIVE H&P  Chief Complaint: Chronic open draining Wound over left Achilles tendon  HPI: Donald [name] is a 59 y.o. male who presents for evaluation of Chronic open draining wound over left Achilles tendon repair. It has been present for Several months and has been worsening. He has failed conservative measures. Pain is rated as moderate.  Past Medical History  Diagnosis Date  . Hypertension   . Bronchitis     HISTORY FEW MO AGO  . Arthritis   . Diverticulitis   . Colon polyps   . Hyperthyroidism    Past Surgical History  Procedure Laterality Date  . Total knee arthroplasty Left 01/08/2013    Procedure: TOTAL KNEE ARTHROPLASTY;  Surgeon: Alta Corning, MD;  Location: [facility];  Service: Orthopedics;  Laterality: Left;  . Achilles tendon repair  08/2014   Social History   Social History  . Marital Status: Single    Spouse Name: N/A  . Number of Children: N/A  . Years of Education: N/A   Occupational History  . Animal nutritionist    Social History Main Topics  . Smoking status: Never Smoker   . Smokeless tobacco: Never Used  . Alcohol Use: 6.0 - 7.2 oz/week    10-12 Cans of beer per week     Comment: 2-3 times a week  . Drug Use: No  . Sexual Activity: Not Asked   Other Topics Concern  . None   Social History Narrative   Family History  Problem Relation Age of Onset  . Emphysema Mother   . COPD Mother   . Cancer Father   . Arthritis Brother    Allergies  Allergen Reactions  . Tape Rash and Other (See Comments)    White strips of tape    Prior to Admission medications   Medication Sig Start Date End Date Taking? Authorizing Provider  allopurinol (ZYLOPRIM) 100 MG tablet TAKE 1 TABLET (100 MG TOTAL) BY MOUTH DAILY. Patient taking differently: TAKE 1 TABLET (100 MG TOTAL) BY MOUTH DAILY AS NEEDED SWELLING 04/06/15  Yes Lyndal Pulley, DO  diclofenac (VOLTAREN) 75 MG EC tablet TAKE 1 TABLET BY MOUTH TWICE A DAY Patient taking differently: TAKE 75 MG BY  MOUTH TWICE A DAY AS NEEDED FOR PAIN 02/06/15  Yes Marin Olp, MD  ibuprofen (ADVIL,MOTRIN) 200 MG tablet Take 400 mg by mouth every 6 (six) hours as needed for headache or moderate pain.   Yes Historical Provider, MD  loratadine (CLARITIN) 10 MG tablet Take 10 mg by mouth as needed for allergies.    Yes Historical Provider, MD  losartan (COZAAR) 100 MG tablet TAKE 1 TABLET BY MOUTH EVERY DAY Patient taking differently: TAKE 100 MG BY MOUTH EVERY DAY 12/30/14  Yes Marin Olp, MD  oxyCODONE-acetaminophen (PERCOCET/ROXICET) 5-325 MG tablet Take 1 tablet by mouth every 6 (six) hours as needed for moderate pain or severe pain.   Yes Historical Provider, MD  traZODone (DESYREL) 50 MG tablet TAKE 1/2 - 1 TABLET BY MOUTH AT BEDTIME AS NEEDED FOR INSOMNIA Patient taking differently: TAKE 50 MG BY MOUTH AT BEDTIME AS NEEDED FOR INSOMNIA 11/04/14  Yes Marin Olp, MD  allopurinol (ZYLOPRIM) 300 MG tablet Take 1 tablet (300 mg total) by mouth daily. Patient not taking: Reported on 04/06/2015 08/12/14   Lyndal Pulley, DO  HYDROcodone-acetaminophen [facility]) 7.5-325 MG per tablet Take 1 tablet by mouth every 8 (eight) hours as needed for moderate pain (cough). Patient taking differently: Take 1  tablet by mouth every 8 (eight) hours as needed for moderate pain.  08/18/14   Lyndal Pulley, DO  nitroGLYCERIN (NITRODUR - DOSED IN MG/24 HR) 0.2 mg/hr patch 1/4 patch daily Patient not taking: Reported on 04/06/2015 07/08/14   Lyndal Pulley, DO  predniSONE (DELTASONE) 50 MG tablet Take 1 tablet (50 mg total) by mouth daily. Patient not taking: Reported on 04/06/2015 06/20/14   Lyndal Pulley, DO     Positive ROS: None  All other systems have been reviewed and were otherwise negative with the exception of those mentioned in the HPI and as above.  Physical Exam: Filed Vitals:   04/12/15 1346  BP: 149/86  Pulse: 75  Temp: 97.2 F (36.2 C)  Resp: 20    General: Alert, no acute  distress Cardiovascular: No pedal edema Respiratory: No cyanosis, no use of accessory musculature GI: No organomegaly, abdomen is soft and non-tender Skin: No lesions in the area of chief complaint Neurologic: Sensation intact distally Psychiatric: Patient is competent for consent with normal mood and affect Lymphatic: No axillary or cervical lymphadenopathy  MUSCULOSKELETAL: Left ankle: Multiple punctate draining sinuses over Achilles tendon wound.  Drainage from these punctate openings.  Erythema surrounding these openings.  Assessment/Plan: infected achilles tendon  left Plan for Procedure(s): DEBRIDEMENT OF ACHILLES TENDON LEFT With removal of fiber wire sutures and placement of incisional wound VAC.  He will also have an infectious disease consult while in the hospital.  The risks benefits and alternatives were discussed with the patient including but not limited to the risks of nonoperative treatment, versus surgical intervention including infection, bleeding, nerve injury, malunion, nonunion, hardware prominence, hardware failure, need for hardware removal, blood clots, cardiopulmonary complications, morbidity, mortality, among others, and they were willing to proceed.  Predicted outcome is good, although there will be at least a six to nine month expected recovery.  Alta Corning, MD 04/12/2015 4:36 PM

## 2015-04-12 NOTE — Transfer of Care (Signed)
Immediate Anesthesia Transfer of Care Note  Patient: Donald [name]  Procedure(s) Performed: Procedure(s): DEBRIDEMENT OF ACHILLES TENDON LEFT, REMOVAL OF FIBERWIRE SUTURE (Left)  APPLICATION OF INCISIONAL WOUND VAC  Patient Location: PACU  Anesthesia Type:General  Level of Consciousness: awake, alert  and oriented  Airway & Oxygen Therapy: Patient Spontanous Breathing and Patient connected to nasal cannula oxygen  Post-op Assessment: Report given to RN  Post vital signs: Reviewed and stable  Last Vitals:  Filed Vitals:   04/12/15 1650  BP: 162/93  Pulse: 78  Temp:   Resp: 18    Complications: No apparent anesthesia complications

## 2015-04-12 NOTE — Anesthesia Preprocedure Evaluation (Addendum)
Anesthesia Evaluation  Patient identified by MRN, date of birth, ID band Patient awake    Reviewed: Allergy & Precautions, NPO status , Patient's Chart, lab work & pertinent test results  History of Anesthesia Complications Negative for: history of anesthetic complications  Airway Mallampati: II  TM Distance: >3 FB Neck ROM: Full    Dental  (+) Dental Advisory Given   Pulmonary asthma ,    breath sounds clear to auscultation       Cardiovascular hypertension, Pt. on medications (-) angina Rhythm:Regular Rate:Normal     Neuro/Psych negative neurological ROS     GI/Hepatic negative GI ROS, Neg liver ROS,   Endo/Other  Morbid obesity  Renal/GU negative Renal ROS     Musculoskeletal  (+) Arthritis , Osteoarthritis,    Abdominal (+) + obese,   Peds  Hematology negative hematology ROS (+)   Anesthesia Other Findings   Reproductive/Obstetrics                         Anesthesia Physical Anesthesia Plan  ASA: III  Anesthesia Plan: General and Regional   Post-op Pain Management: GA combined w/ Regional for post-op pain   Induction: Intravenous  Airway Management Planned: Oral ETT  Additional Equipment:   Intra-op Plan:   Post-operative Plan: Extubation in OR  Informed Consent: I have reviewed the patients History and Physical, chart, labs and discussed the procedure including the risks, benefits and alternatives for the proposed anesthesia with the patient or authorized representative who has indicated his/her understanding and acceptance.   Dental advisory given  Plan Discussed with: CRNA and Surgeon  Anesthesia Plan Comments: (Plan routine monitors, GETA )       Anesthesia Quick Evaluation

## 2015-04-13 ENCOUNTER — Encounter (HOSPITAL_COMMUNITY): Payer: Self-pay | Admitting: Orthopedic Surgery

## 2015-04-13 DIAGNOSIS — Z96652 Presence of left artificial knee joint: Secondary | ICD-10-CM

## 2015-04-13 DIAGNOSIS — T814XXS Infection following a procedure, sequela: Secondary | ICD-10-CM

## 2015-04-13 DIAGNOSIS — Z9889 Other specified postprocedural states: Secondary | ICD-10-CM

## 2015-04-13 DIAGNOSIS — B9689 Other specified bacterial agents as the cause of diseases classified elsewhere: Secondary | ICD-10-CM

## 2015-04-13 MED ORDER — VANCOMYCIN HCL 10 G IV SOLR
1250.0000 mg | Freq: Two times a day (BID) | INTRAVENOUS | Status: DC
  Administered 2015-04-14 – 2015-04-15 (×3): 1250 mg via INTRAVENOUS
  Filled 2015-04-13 (×4): qty 1250

## 2015-04-13 MED ORDER — VANCOMYCIN HCL 10 G IV SOLR
2000.0000 mg | Freq: Once | INTRAVENOUS | Status: AC
  Administered 2015-04-13: 2000 mg via INTRAVENOUS
  Filled 2015-04-13: qty 2000

## 2015-04-13 NOTE — Consult Note (Addendum)
[name] for Infectious Disease    Date of Admission:  04/12/2015   Total days of antibiotics 2        Day 2 cefazolin           ID: Donald [name] is a 59 y.o. male with   Principal Problem:   Draining postoperative wound Active Problems:   S/P Achilles tendon repair   Drainage from wound   i have seen, examined and discussed treatment plan with Dr. Benjamine Mola. Please see his note for full consultation  Assessment/Plan:  Achilles tendon repair with poor wound healing, presumed secondary infection = will plan to treat for 4 wk with IV vancomycin plus rifampin for possible coagulase negative staph species that often develop biofilms. Patient will need picc line. Will get pharmacy consultation to start IV vancomycin and determine dose/Q12 interval.  Will have him follow up in the ID clinic in 4 wk to see if he needs further oral antibiotic  Will check sed rate and crp  For health maintenance, will check hiv and hep c ab.  Thank you for consultation  ------------------ For advance home health Iv orders:  Diagnosis: Wound dehiscence and deep tissue infection  Culture Result: PENDING  Allergies  Allergen Reactions  . Tape Rash and Other (See Comments)    White strips of tape     Discharge antibiotics: Per pharmacy protocol: vancomycin Duration:  4 weeks End Date: Nov 9th  [name] Per Protocol: Labs weekly while on IV antibiotics: x CBC with differential _x_ BMP __ CRP __ ESR x Vancomycin trough  Fax weekly labs to [phone]  Clinic Follow Up Appt:  in 4 weeks with Laiya Wisby  @ [place], [facility] Cell: [phone] Pager: [phone]  04/13/2015, 5:29 PM

## 2015-04-13 NOTE — Anesthesia Postprocedure Evaluation (Signed)
  Anesthesia Post-op Note  Patient: Donald [name]  Procedure(s) Performed: Procedure(s): DEBRIDEMENT OF ACHILLES TENDON LEFT, REMOVAL OF FIBERWIRE SUTURE (Left)  APPLICATION OF INCISIONAL WOUND VAC  Patient Location: PACU  Anesthesia Type:General and Regional  Level of Consciousness: awake and alert   Airway and Oxygen Therapy: Patient Spontanous Breathing  Post-op Pain: mild  Post-op Assessment: Post-op Vital signs reviewed, Patient's Cardiovascular Status Stable, Respiratory Function Stable, Patent Airway, No signs of Nausea or vomiting and Pain level controlled   LLE Sensation: Decreased, Numbness   RLE Sensation: Full sensation      Post-op Vital Signs: Reviewed and stable  Last Vitals:  Filed Vitals:   04/13/15 1350  BP: 119/66  Pulse: 82  Temp: 36.8 C  Resp: 18    Complications: No apparent anesthesia complications

## 2015-04-13 NOTE — Op Note (Signed)
NAME:  RUSH, SALCE NO.:  [id number]  MEDICAL RECORD NO.:  [id number]  LOCATION:  5N23C                        FACILITY:  [place]  PHYSICIAN:  Alta Corning, M.D.   DATE OF BIRTH:  Jul 14, 1955  DATE OF PROCEDURE:  04/12/2015 DATE OF DISCHARGE:                              OPERATIVE REPORT   PREOPERATIVE DIAGNOSIS:  Infected Achilles tendon repair with chronic drainage 51-month-old.  POSTOPERATIVE DIAGNOSIS:  Infected Achilles tendon repair with chronic drainage 79-month-old.  PROCEDURE: 1. Tenolysis and debridement of Achilles tendon, left. 2. Removal of multiple deep implants FiberWire. 3. Primary wound closure with placement of incisional wound VAC.  SURGEON:  Alta Corning, M.D.  ASSISTANT:  Gary Fleet, P.A.  ANESTHESIA:  General.  BRIEF HISTORY:  Mr. Pohle is a 59 year old male with a long history of having had an Achilles tendon rupture.  He was treated surgically but unfortunately was noncompliant postoperatively and was walking on the repair.  He had an early skin breakdown.  Because of this, he kind of went on to have chronic drainage, he would do better with antibiotics and rest, worse when he was up on and just continued to drain and ooze, and several of those looked like he was getting erythema in the skin, and there was concern for systemic infection, and ultimately we just felt like we needed to go back and debride this out.  He was taken to the operating for this procedure.  DESCRIPTION OF PROCEDURE:  The patient was taken to the operating room. After adequate anesthesia was obtained with general anesthetic, the patient was placed prone on the operating table.  After this, the left leg was prepped and draped in usual sterile fashion.  Following this, we incised the old incision giving some birth for excising the draining sinus tracts, and we got up to the widest strip maybe 7 mm of skin was excised and this was removed.  Following  this, we did a careful dissection as deep as we could with the flaps of the skin all the way down to the tendinous sheath.  Once we did this, we saw areas of FiberWire which almost looked like some necrotic __________ within the tendon.  We tried to excise all of this but really could not get all the FiberWire out.  We did debride the chronic what looked like degenerative areas of the tendon and removed the FiberWire.  We took out multiple probably 10 pieces of FiberWire and continued to look along the edges where we did the Krackow stitch repair but we were not finding additional and felt that we were causing harm to the tendon that continued to look.  At that point, we just debrided all of the degenerative looking tissue, and we felt that we were really in pretty good shape.  At that point, we had done a nice tenolysis of the tendon. We debrided it thoroughly, and we had removed multiple probably 10 deep implants of FiberWire.  At this point, we irrigated with a pulsatile lavage irrigation and suction.  We let the tourniquet down and controlled all bleeding with electrocautery and then we did a closure with some interrupted far-near near-far stitches  to take some tension off the wound and then closed the wound loosely with interrupted nylon suture.  We really got a very nice looking repair of the skin.  The skin looked very healthy.  We put an incisional wound VAC on it just to make sure there was any drainage and also to maybe suck in some healing elements, and he will be discharged home with that as a battery type pack.  We did not find gross contamination, but we did send cultures right from the area where these sutures were, and we will await Infectious Disease consultation and decision for duration of antibiotics. He will be admitted for least a day for IV antibiotics, and we will make some decision about whether he will need IV or oral antibiotic therapy at home.  We put a  compressive dressing on him to try to take some tension off this wound, and he did have incisional wound VAC.  The estimated blood loss for the procedure was none.     Alta Corning, M.D.     Corliss Skains  D:  04/12/2015  T:  04/13/2015  Job:  [id number]

## 2015-04-13 NOTE — Progress Notes (Signed)
Subjective: 1 Day Post-Op Procedure(s) (LRB): DEBRIDEMENT OF ACHILLES TENDON LEFT, REMOVAL OF FIBERWIRE SUTURE (Left)  APPLICATION OF INCISIONAL WOUND VAC Patient reports pain as mild.    Objective: Vital signs in last 24 hours: Temp:  [97.2 F (36.2 C)-98.5 F (36.9 C)] 98.5 F (36.9 C) (10/13 0513) Pulse Rate:  [70-87] 70 (10/13 0513) Resp:  [14-20] 16 (10/13 0513) BP: (117-178)/(68-93) 117/68 mmHg (10/13 0513) SpO2:  [97 %-100 %] 99 % (10/13 0513) Weight:  [335 lb (151.955 kg)] 335 lb (151.955 kg) (10/12 1426)  Intake/Output from previous day: 10/12 0701 - 10/13 0700 In: 1000 [I.V.:1000] Out: 1100 [Urine:1100] Intake/Output this shift:    No results for input(s): HGB in the last 72 hours. No results for input(s): WBC, RBC, HCT, PLT in the last 72 hours. No results for input(s): NA, K, CL, CO2, BUN, CREATININE, GLUCOSE, CALCIUM in the last 72 hours. No results for input(s): LABPT, INR in the last 72 hours.  Neurologically intact Sensation intact distally Compartment soft  Assessment/Plan: 1 Day Post-Op Procedure(s) (LRB): DEBRIDEMENT OF ACHILLES TENDON LEFT, REMOVAL OF FIBERWIRE SUTURE (Left)  APPLICATION OF INCISIONAL WOUND VAC Advance diet Up with therapy Plan for discharge tomorrow with the help of ID to decide on ABX (either IV or oral and duration)  Donald [name] 04/13/2015, 7:57 AM

## 2015-04-13 NOTE — Progress Notes (Signed)
Orthopedic Tech Progress Note Patient Details:  Donald [name] 1955-08-29 [id number] Crutches delivered for use and instruction with PT Ortho Devices Type of Ortho Device: Crutches Ortho Device/Splint Interventions: Ordered   Asia Mellody Memos 04/13/2015, 7:18 AM

## 2015-04-13 NOTE — Evaluation (Addendum)
Physical Therapy Evaluation Patient Details Name: Donald [name] MRN: [id number] DOB: 1955-07-17 Today's Date: 04/13/2015   History of Present Illness  59 y.o. male who presents for evaluation of Chronic open draining wound over left Achilles tendon repair. Now s/p DEBRIDEMENT OF ACHILLES TENDON LEFT, REMOVAL OF FIBERWIRE SUTURE. SEG:[id number]  Clinical Impression  Patient is s/p above surgery resulting in functional limitations due to the deficits listed below (see PT Problem List).  Patient will benefit from skilled PT to increase their independence and safety with mobility to allow discharge to home alone. Patient moving well with bed mobility and transfers at this time. Able to ambulate 40 feet with rw (per patient preference) but not consistent with NWB status but more touch down weightbearing more consistent despite repeated cues provided for NWB throughout session. Will progress at next session with general mobility and gait.     Follow Up Recommendations No PT follow up    Equipment Recommendations  None recommended by PT;Other (comment) (patient reports having all needed equipment at home)    Recommendations for Other Services       Precautions / Restrictions Precautions Precautions: Fall Restrictions Weight Bearing Restrictions: Yes LLE Weight Bearing: Non weight bearing      Mobility  Bed Mobility Overal bed mobility: Independent             General bed mobility comments: no assistance needed supine to sit and sit to supine.  Transfers Overall transfer level: Needs assistance Equipment used: Rolling walker (2 wheeled) Transfers: Sit to/from Stand Sit to Stand: Supervision            Ambulation/Gait Ambulation/Gait assistance: Min guard Ambulation Distance (Feet): 40 Feet Assistive device: Rolling walker (2 wheeled) Gait Pattern/deviations: Step-to pattern Gait velocity: decreased   General Gait Details: Repaeated cues to maintain NWB status.  Patient reporting that he is not putting any weight through LLE. Observing touchdown weightbearing with ambulation. Patient requeting to use rw today as this will be his primary assistive device at home.   Stairs            Wheelchair Mobility    Modified Rankin (Stroke Patients Only)       Balance Overall balance assessment: Needs assistance Sitting-balance support: No upper extremity supported Sitting balance-Leahy Scale: Good     Standing balance support: Bilateral upper extremity supported Standing balance-Leahy Scale: Poor Standing balance comment: using rw                             Pertinent Vitals/Pain Pain Assessment: 0-10 Pain Score: 3  Pain Location: Lt foot/ankle, Rt hip with ambulation Pain Descriptors / Indicators: Aching;Burning Pain Intervention(s): Monitored during session;Limited activity within patient's tolerance;Ice applied    Home Living Family/patient expects to be discharged to:: Private residence Living Arrangements: Alone Available Help at Discharge: Family Type of Home: House Home Access: Level entry     Home Layout: Able to live on main level with bedroom/bathroom Home Equipment: Gilford Rile - 2 wheels;Crutches Additional Comments: Patient reports brother will be taking him home from the hospital but patient is planning to be home alone.     Prior Function Level of Independence: Independent               Hand Dominance        Extremity/Trunk Assessment   Upper Extremity Assessment: Overall WFL for tasks assessed           Lower Extremity Assessment:  LLE deficits/detail   LLE Deficits / Details: independent with SLR     Communication   Communication: No difficulties  Cognition Arousal/Alertness: Awake/alert Behavior During Therapy: WFL for tasks assessed/performed Overall Cognitive Status: Within Functional Limits for tasks assessed                      General Comments      Exercises         Assessment/Plan    PT Assessment Patient needs continued PT services  PT Diagnosis Difficulty walking   PT Problem List Decreased strength;Decreased range of motion;Decreased activity tolerance;Decreased balance;Decreased mobility;Decreased knowledge of precautions;Pain  PT Treatment Interventions DME instruction;Gait training;Stair training;Functional mobility training;Therapeutic activities;Therapeutic exercise;Balance training;Patient/family education   PT Goals (Current goals can be found in the Care Plan section) Acute Rehab PT Goals Patient Stated Goal: go home alone tomorrow PT Goal Formulation: With patient Time For Goal Achievement: 04/27/15 Potential to Achieve Goals: Good    Frequency Min 5X/week   Barriers to discharge Decreased caregiver support      Co-evaluation               End of Session Equipment Utilized During Treatment: Gait belt Activity Tolerance: Patient tolerated treatment well;Patient limited by fatigue Patient left: in bed;with call bell/phone within reach;Other (comment) (LLE elevated) Nurse Communication: Mobility status         Time: 1411-1436 PT Time Calculation (min) (ACUTE ONLY): 25 min   Charges:   PT Evaluation $Initial PT Evaluation Tier I: 1 Procedure PT Treatments $Gait Training: 8-22 mins   PT G Codes:        Cassell Clement, PT, CSCS Pager [phone] Office [phone]  04/13/2015, 3:47 PM

## 2015-04-13 NOTE — Progress Notes (Addendum)
ANTIBIOTIC CONSULT NOTE - INITIAL  Pharmacy Consult for Vancomycin Indication: Wound infection  Allergies  Allergen Reactions  . Tape Rash and Other (See Comments)    White strips of tape     Patient Measurements: Height: 6\' 3"  (190.5 cm) Weight: (!) 335 lb (151.955 kg) IBW/kg (Calculated) : 84.5 Adjusted Body Weight:   Vital Signs: Temp: 98.2 F (36.8 C) (10/13 1350) BP: 119/66 mmHg (10/13 1350) Pulse Rate: 82 (10/13 1350) Intake/Output from previous day: 10/12 0701 - 10/13 0700 In: 1000 [I.V.:1000] Out: 1100 [Urine:1100] Intake/Output from this shift: Total I/O In: 600 [P.O.:600] Out: 1 [Urine:1]  Labs: No results for input(s): WBC, HGB, PLT, LABCREA, CREATININE in the last 72 hours. Estimated Creatinine Clearance: 135.1 mL/min (by C-G formula based on Cr of 0.94). No results for input(s): VANCOTROUGH, VANCOPEAK, VANCORANDOM, GENTTROUGH, GENTPEAK, GENTRANDOM, TOBRATROUGH, TOBRAPEAK, TOBRARND, AMIKACINPEAK, AMIKACINTROU, AMIKACIN in the last 72 hours.   Microbiology: Recent Results (from the past 720 hour(s))  Wound culture     Status: None (Preliminary result)   Collection Time: 04/12/15  5:40 PM  Result Value Ref Range Status   Specimen Description WOUND LEFT  Final   Special Requests ACHILLES TENDON  Final   Gram Stain   Final    FEW WBC PRESENT, PREDOMINANTLY PMN NO SQUAMOUS EPITHELIAL CELLS SEEN NO ORGANISMS SEEN Performed at [facility]    Culture PENDING  Incomplete   Report Status PENDING  Incomplete    Medical History: Past Medical History  Diagnosis Date  . Hypertension   . Bronchitis     HISTORY FEW MO AGO  . Arthritis   . Diverticulitis   . Colon polyps   . Hyperthyroidism     Medications:  Scheduled:  . docusate sodium  100 mg Oral BID  . losartan  100 mg Oral Daily   Assessment: 59yo male admitted with chronic open draining wound s/p L-achilles tendon repair that has been worsening over several months, now s/p  Tenolysis/debridement of achilles tendon & removal of deep implants..  He has been on no antibiotics at home.  Plan is to place PICC and send home on Vancomycin with Rifampin x 4 weeks; will check trough at steady state.  Cr 0.94, CrCl ~39ml/min WBC 6.3 Wound cultures (p)  Goal of Therapy:  Vanc trough 10-15  Plan:  Vancomycin 2000mg  IV x 1, then 1250mg  IV q12 Check steady state trough (10/15) Watch renal function F/U culture results  Gracy Bruins, PharmD Clinical Pharmacist [facility]

## 2015-04-13 NOTE — Consult Note (Signed)
[facility] for Infectious Disease           Total days of antibiotics 2        Day 10/12-10/13 Ancef        Day 10/13 vancomycin        Reason for Consult: Antibiotics plan for subacute wound infection s/p debridement    Referring Physician: Dorna Leitz  Principal Problem:   Draining postoperative wound Active Problems:   S/P Achilles tendon repair   Drainage from wound    HPI: Donald [name] is a 59 y.o. male with history of achilles tendon rupture in 08/2014 with repair and fiberwire suturing who had chronic wound draining since that time. This failed to improved and inflammation worsened, although never developed systemic symptoms such as fever, leukocytosis. He was admitted for debridement of the site 10/12 and had the majority of suture material excised as well as inflamed appearing soft tissue. Wound vac placed and ankle put into splint and compression. Wound culture was obtained and placed on empiric Ancef for treatment.  Past Medical History  Diagnosis Date  . Hypertension   . Bronchitis     HISTORY FEW MO AGO  . Arthritis   . Diverticulitis   . Colon polyps   . Hyperthyroidism     Allergies:  Allergies  Allergen Reactions  . Tape Rash and Other (See Comments)    White strips of tape     Current antibiotics:   MEDICATIONS: . docusate sodium  100 mg Oral BID  . losartan  100 mg Oral Daily  . [START ON 04/14/2015] vancomycin  1,250 mg Intravenous Q12H  . vancomycin  2,000 mg Intravenous Once    Social History  Substance Use Topics  . Smoking status: Never Smoker   . Smokeless tobacco: Never Used  . Alcohol Use: 6.0 - 7.2 oz/week    10-12 Cans of beer per week     Comment: 2-3 times a week    Family History  Problem Relation Age of Onset  . Emphysema Mother   . COPD Mother   . Cancer Father   . Arthritis Brother     Review of Systems  Constitutional: Negative for fever and chills.  Respiratory: Negative for shortness of breath.     Cardiovascular: Negative for leg swelling.  Gastrointestinal: Negative for nausea and diarrhea.  Musculoskeletal: Positive for joint pain. Negative for myalgias and falls.  Skin: Negative for rash.  Neurological: Negative for focal weakness.      OBJECTIVE: Temp:  [98 F (36.7 C)-98.5 F (36.9 C)] 98.2 F (36.8 C) (10/13 1350) Pulse Rate:  [70-82] 82 (10/13 1350) Resp:  [15-18] 18 (10/13 1350) BP: (117-165)/(66-85) 119/66 mmHg (10/13 1350) SpO2:  [97 %-100 %] 99 % (10/13 1350)  GENERAL- alert, co-operative, NAD HEENT- oral mucosa appears moist, good and intact dentition. CARDIAC- RRR, no murmurs, rubs or gallops. RESP- CTAB, no wheezes or crackles. ABDOMEN- Soft, nontender NEURO- Movement and sensation intact in toes distal to LLE splint/compression wrap EXTREMITIES- no pedal edema. SKIN- Warm, dry, No rash or lesion. PSYCH- Normal mood and affect, appropriate thought content and speech.   LABS: Results for orders placed or performed during the hospital encounter of 04/12/15 (from the past 48 hour(s))  Wound culture     Status: None (Preliminary result)   Collection Time: 04/12/15  5:40 PM  Result Value Ref Range   Specimen Description WOUND LEFT    Special Requests ACHILLES TENDON    Gram Stain  FEW WBC PRESENT, PREDOMINANTLY PMN NO SQUAMOUS EPITHELIAL CELLS SEEN NO ORGANISMS SEEN Performed at [facility]    Culture PENDING    Report Status PENDING     MICRO:  IMAGING: No results found.  HISTORICAL MICRO/IMAGING  Assessment/Plan:  Achilles tendon repair with complications of incomplete wound healing over the past 7 months, now with evidence of infection requiring debridement. Wound is consistent with a deep soft tissue infection without osseus involvement. We will check inflammatory markers as well.  He also has replacement knee in left leg as well, but no evidence of systemic or local spread. Due to presence of artificial material still on  board treatment staph biofilm formation is large infection concern. Patient has a very good functional status and should be able to manage parenteral abtx at home with education.  -Will obtain ESR/CRP -Plan will be for PICC line placement and 4 weeks IV abtx with vancomycin IV and rifampin PO -Patient needs education on PICC management and use per IV team -We will consult pharmacy to determine dosing plan prior to discharge for q12hrs vancomycin IV -Will follow up at 4 weeks to determine discontinuing therapy vs continued oral abtx   Thank you for consultation, please call with additional questions as needed.   Hinton Lovely Internal Medicine Resident PGY-I Pager: [phone]  04/13/2015, 6:29 PM

## 2015-04-13 NOTE — Progress Notes (Signed)
Utilization review completed.  

## 2015-04-14 DIAGNOSIS — T8149XA Infection following a procedure, other surgical site, initial encounter: Secondary | ICD-10-CM

## 2015-04-14 LAB — C-REACTIVE PROTEIN: CRP: 4.1 mg/dL — ABNORMAL HIGH (ref <1.0)

## 2015-04-14 LAB — BASIC METABOLIC PANEL
ANION GAP: 7 (ref 5–15)
BUN: 9 mg/dL (ref 6–20)
CHLORIDE: 105 mmol/L (ref 101–111)
CO2: 26 mmol/L (ref 22–32)
Calcium: 8.7 mg/dL — ABNORMAL LOW (ref 8.9–10.3)
Creatinine, Ser: 0.88 mg/dL (ref 0.61–1.24)
GFR calc non Af Amer: >60 mL/min (ref >60)
Glucose, Bld: 137 mg/dL — ABNORMAL HIGH (ref 65–99)
Potassium: 4.5 mmol/L (ref 3.5–5.1)
SODIUM: 138 mmol/L (ref 135–145)

## 2015-04-14 LAB — SEDIMENTATION RATE: Sed Rate: 51 mm/hr — ABNORMAL HIGH (ref 0–16)

## 2015-04-14 MED ORDER — DEXTROSE 5 % IV SOLN
2.0000 g | INTRAVENOUS | Status: DC
  Administered 2015-04-14: 2 g via INTRAVENOUS
  Filled 2015-04-14 (×4): qty 2

## 2015-04-14 MED ORDER — OXYCODONE-ACETAMINOPHEN 5-325 MG PO TABS: 1.0000 | ORAL_TABLET | Freq: Four times a day (QID) | ORAL | Status: DC | PRN

## 2015-04-14 MED ORDER — RIFAMPIN 300 MG PO CAPS
300.0000 mg | ORAL_CAPSULE | Freq: Two times a day (BID) | ORAL | Status: DC
  Administered 2015-04-14 – 2015-04-15 (×2): 300 mg via ORAL
  Filled 2015-04-14 (×2): qty 1

## 2015-04-14 MED ORDER — SODIUM CHLORIDE 0.9 % IJ SOLN
10.0000 mL | INTRAMUSCULAR | Status: DC | PRN
Start: 2015-04-14 — End: 2015-04-15
  Administered 2015-04-15: 10 mL
  Filled 2015-04-14: qty 40

## 2015-04-14 NOTE — Discharge Instructions (Signed)
Elevate your leg as much as possible. Ambulate nonweightbearing on the left leg with crutches.

## 2015-04-14 NOTE — Progress Notes (Signed)
[facility] for Infectious Disease    Date of Admission:  04/12/2015   Total days of antibiotics 2 Day 10/12-10/13 Ancef Day 10/13-10/14 vancomycin        Day 10/14 Ceftriaxone   ID: Donald [name] is a 59 y.o. male with history of achilles tendon rupture in 08/2014 with repair and fiberwire suturing who had chronic wound draining since that time. This failed to improved and inflammation worsened, although never developed systemic symptoms such as fever, leukocytosis. He was admitted for debridement of the site 10/12 and had the majority of suture material excised as well as inflamed appearing soft tissue. Wound vac placed and ankle put into splint and compression. Wound culture was obtained and placed on empiric Ancef for treatment then vancomycin, but initial wound  Sample analysis showing gram negative rods.  Principal Problem:   Draining postoperative wound Active Problems:   S/P Achilles tendon repair   Drainage from wound   Wound infection after surgery   Subjective: Patient continues to feel well today without systemic symptoms. He was originally planned for possible discharged after his PICC line placement, but wound cultures demonstrating gram negative rods. Started on IV rocephin and was kept at hospital due to need for selected appropriate abtx.  Medications:  . cefTRIAXone (ROCEPHIN)  IV  2 g Intravenous Q24H  . docusate sodium  100 mg Oral BID  . losartan  100 mg Oral Daily  . rifampin  300 mg Oral BID WC  . vancomycin  1,250 mg Intravenous Q12H    Objective: Vital signs in last 24 hours: Temp:  [98.3 F (36.8 C)-98.6 F (37 C)] 98.4 F (36.9 C) (10/14 1500) Pulse Rate:  [73-82] 82 (10/14 1500) Resp:  [18-20] 20 (10/14 1500) BP: (138-156)/(67-81) 156/81 mmHg (10/14 1500) SpO2:  [96 %-100 %]  100 % (10/14 1500)   GENERAL- alert, co-operative, NAD HEENT- oral mucosa appears moist, good and intact dentition. CARDIAC- RRR, no murmurs, rubs or gallops. RESP- CTAB, no wheezes or crackles. ABDOMEN- Soft, nontender NEURO- Movement and sensation intact in toes distal to LLE splint/compression wrap EXTREMITIES- no pedal edema. SKIN- Warm, dry, No rash or lesion. PSYCH- Normal mood and affect, appropriate thought content and speech.   Lab Results  Recent Labs  04/14/15 1047  NA 138  K 4.5  CL 105  CO2 26  BUN 9  CREATININE 0.88   Liver Panel No results for input(s): PROT, ALBUMIN, AST, ALT, ALKPHOS, BILITOT, BILIDIR, IBILI in the last 72 hours. Sedimentation Rate  Recent Labs  04/14/15 0432  ESRSEDRATE 51*   C-Reactive Protein  Recent Labs  04/14/15 0432  CRP 4.1*    Microbiology:  Studies/Results: No results found.   Assessment/Plan: ESR 51 and CRP 4.1 not highly concerning for osseus involvement. Rare gram negative rods on wound culture were not originally the most likely organism for this infection, and we need to decide appropriate antibiotic. This wound has failed treatment multiple times. We will continue ceftriaxone until additional specificity is available, hopefully tomorrow.  -Continue dressing care and weightbearing per ortho recs -Continue IV ceftriaxone for now -Check with micro lab tomorrow for speciation of the wound culture gram negative rods -Will decide on final abtx recs after that information is available, still most likely a 4 week duration with follow up at ID clinic to assess   [facility] for Infectious Diseases Cell: [phone] Pager: [phone]  04/14/2015, 8:11 PM

## 2015-04-14 NOTE — Progress Notes (Addendum)
Physical Therapy Treatment Patient Details Name: Donald [name] MRN: [id number] DOB: 1956/06/07 Today's Date: Apr 30, 2015    History of Present Illness 59 y.o. male who presents for evaluation of Chronic open draining wound over left Achilles tendon repair. Now s/p DEBRIDEMENT OF ACHILLES TENDON LEFT, REMOVAL OF FIBERWIRE SUTURE. CBS:[id number]    PT Comments    Pt very pleasant and moving well for all transfers. Pt with continued tendency to perform TDWB LLE particularly with toileting and transfers. Pt educated for LLE NWB, restrictions and HEP to maintain strength of LLE. Discussed home adaptations of desk chair for function in kitchen to limit weightbearing and hold items. Will continue to follow.   Follow Up Recommendations  No PT follow up     Equipment Recommendations       Recommendations for Other Services       Precautions / Restrictions Precautions Precautions: Fall Precaution Comments: wound VAC Restrictions LLE Weight Bearing: Non weight bearing    Mobility  Bed Mobility Overal bed mobility: Independent                Transfers Overall transfer level: Needs assistance     Sit to Stand: Supervision         General transfer comment: cues for NWB LLE to stand  Ambulation/Gait Ambulation/Gait assistance: Min guard Ambulation Distance (Feet): 75 Feet Assistive device: Rolling walker (2 wheeled) Gait Pattern/deviations: Step-to pattern   Gait velocity interpretation: Below normal speed for age/gender General Gait Details: Repaeated cues to maintain NWB status. Patient reporting that he is not putting any weight through LLE. Observing touchdown weightbearing with ambulation.    Stairs            Wheelchair Mobility    Modified Rankin (Stroke Patients Only)       Balance     Sitting balance-Leahy Scale: Good       Standing balance-Leahy Scale: Fair                      Cognition Arousal/Alertness:  Awake/alert Behavior During Therapy: WFL for tasks assessed/performed Overall Cognitive Status: Within Functional Limits for tasks assessed                      Exercises General Exercises - Lower Extremity Long Arc Quad: AROM;Seated;Left;20 reps Hip Flexion/Marching: AROM;Seated;Left;20 reps    General Comments        Pertinent Vitals/Pain Pain Assessment: No/denies pain    Home Living                      Prior Function            PT Goals (current goals can now be found in the care plan section) Progress towards PT goals: Progressing toward goals    Frequency  Min 5X/week    PT Plan Current plan remains appropriate    Co-evaluation             End of Session   Activity Tolerance: Patient tolerated treatment well Patient left: in chair;with call bell/phone within reach     Time: 1237-1254 PT Time Calculation (min) (ACUTE ONLY): 17 min  Charges:  $Gait Training: 8-22 mins                    G Codes:      Melford Aase 30-Apr-2015, 1:01 PM Elwyn Reach, [place]

## 2015-04-14 NOTE — Progress Notes (Signed)
Peripherally Inserted Central Catheter/Midline Placement  The IV Nurse has discussed with the patient and/or persons authorized to consent for the patient, the purpose of this procedure and the potential benefits and risks involved with this procedure.  The benefits include less needle sticks, lab draws from the catheter and patient may be discharged home with the catheter.  Risks include, but not limited to, infection, bleeding, blood clot (thrombus formation), and puncture of an artery; nerve damage and irregular heat beat.  Alternatives to this procedure were also discussed.  PICC/Midline Placement Documentation  PICC / Midline Single Lumen 49/20/10 PICC Right Basilic 50 cm 1 cm (Active)  Indication for Insertion or Continuance of Line Home intravenous therapies (PICC only) 04/14/2015 11:00 AM  Exposed Catheter (cm) 1 cm 04/14/2015 11:00 AM  Dressing Change Due 04/21/15 04/14/2015 11:00 AM       Donald [name] 04/14/2015, 11:58 AM

## 2015-04-14 NOTE — Progress Notes (Signed)
PT Cancellation Note  Patient Details Name: Donald [name] MRN: [id number] DOB: [birth year]   Cancelled Treatment:    Reason Eval/Treat Not Completed: Other (comment) (pt currently receiving HHeducation and declined therapy currently. Will attempt later)   Lanetta Inch Beth 04/14/2015, 11:02 AM Elwyn Reach, [place]

## 2015-04-14 NOTE — Progress Notes (Addendum)
Subjective: 2 Days Post-Op Procedure(s) (LRB): DEBRIDEMENT OF ACHILLES TENDON LEFT, REMOVAL OF FIBERWIRE SUTURE (Left)  APPLICATION OF INCISIONAL WOUND VAC Patient reports pain as mild.  The patient is without complaints. He reports he is ready to go home whenever we are ready to let him go. PICC line was placed this morning.  Objective: Vital signs in last 24 hours: Temp:  [98.3 F (36.8 C)-98.6 F (37 C)] 98.4 F (36.9 C) (10/14 1500) Pulse Rate:  [73-82] 82 (10/14 1500) Resp:  [18-20] 20 (10/14 1500) BP: (138-156)/(67-81) 156/81 mmHg (10/14 1500) SpO2:  [96 %-100 %] 100 % (10/14 1500)  Intake/Output from previous day: 10/13 0701 - 10/14 0700 In: 1580 [P.O.:1080; IV Piggyback:500] Out: 1 [Urine:1] Intake/Output this shift: Total I/O In: 360 [P.O.:360] Out: -    Recent Labs  04/14/15 1047  NA 138  K 4.5  CL 105  CO2 26  BUN 9  CREATININE 0.88  GLUCOSE 137*  CALCIUM 8.7*   Gram stain and cultures of left Achilles tendon showed rare gram-negative rods. Cultures are still pending. No anaerobes isolated. Left lower extremity exam: Dressing/Ace bandage are clean and dry. The VAC dressing is intact. No significant drainage from VAC. Moves toes actively. No redness or streaking proximally. Posterior splint is intact.   Assessment/Plan: 2 Days Post-Op Procedure(s) (LRB): DEBRIDEMENT OF ACHILLES TENDON LEFT, REMOVAL OF FIBERWIRE SUTURE (Left)  APPLICATION OF INCISIONAL WOUND VAC Plan: We spoke with Dr. Baxter Flattery of infectious disease. She would like to wait for the cultures to mature so that appropriate antibiotic recommendations can be made. She is on call this weekend. Continue on IV  Rocephin  in the meantime. PICC line was placed today.Marland Kitchen He will ambulate nonweightbearing on the left with crutches. Will plan on discharge home tomorrow on IV antibiotics per infectious disease recommendation of dose and length of therapy.  [place] G 04/14/2015, 4:34 PM

## 2015-04-14 NOTE — Care Management Note (Signed)
Case Management Note  Patient Details  Name: MCLAIN FREER MRN: [id number] Date of Birth: [birth year]  Subjective/Objective:       Admitted with wound infection             Action/Plan: Patient to have PICC placed today and to discharge on IV vancomycin. Spoke with patient about home IV antibiotics, he is agreeable with learning to give antibiotics. He chose [facility]. Contacted Pam with [facility] and set up home RN and IV antibiotics. Patient stated that he lives alone but will have friend staying with him for a few days. Will continue to follow for d/c needs.     Expected Discharge Date:                  Expected Discharge Plan:  [place]  In-House Referral:  NA  Discharge planning Services  CM Consult  Post Acute Care Choice:  Home Health, Durable Medical Equipment Choice offered to:  Patient  DME Arranged:  IV pump/equipment DME Agency:  [facility] Arranged:  RN, IV Antibiotics HH Agency:  [facility]  Status of Service:  In process, will continue to follow  Medicare Important Message Given:    Date Medicare IM Given:    Medicare IM give by:    Date Additional Medicare IM Given:    Additional Medicare Important Message give by:     If discussed at [place] of Stay Meetings, dates discussed:    Additional Comments:  Nila Nephew, RN 04/14/2015, 9:55 AM

## 2015-04-15 DIAGNOSIS — M7989 Other specified soft tissue disorders: Secondary | ICD-10-CM

## 2015-04-15 DIAGNOSIS — Z029 Encounter for administrative examinations, unspecified: Secondary | ICD-10-CM | POA: Insufficient documentation

## 2015-04-15 DIAGNOSIS — A498 Other bacterial infections of unspecified site: Secondary | ICD-10-CM | POA: Insufficient documentation

## 2015-04-15 DIAGNOSIS — Z758 Other problems related to medical facilities and other health care: Secondary | ICD-10-CM | POA: Insufficient documentation

## 2015-04-15 LAB — WOUND CULTURE

## 2015-04-15 LAB — VANCOMYCIN, TROUGH: VANCOMYCIN TR: 11 ug/mL (ref 10.0–20.0)

## 2015-04-15 MED ORDER — HEPARIN SOD (PORK) LOCK FLUSH 100 UNIT/ML IV SOLN
250.0000 [IU] | INTRAVENOUS | Status: AC | PRN
Start: 2015-04-15 — End: 2015-04-15
  Administered 2015-04-15: 250 [IU]

## 2015-04-15 MED ORDER — ERTAPENEM SODIUM 1 G IJ SOLR
1.0000 g | INTRAMUSCULAR | Status: DC
  Administered 2015-04-15: 1 g via INTRAVENOUS
  Filled 2015-04-15: qty 1

## 2015-04-15 MED ORDER — SODIUM CHLORIDE 0.9 % IV SOLN: 1.0000 g | INTRAVENOUS | Status: DC

## 2015-04-15 NOTE — Progress Notes (Signed)
Subjective: 3 Days Post-Op Procedure(s) (LRB): DEBRIDEMENT OF ACHILLES TENDON LEFT, REMOVAL OF FIBERWIRE SUTURE (Left)  APPLICATION OF INCISIONAL WOUND VAC  Activity level:  Non weightbearing left leg Diet tolerance:  ok Voiding:  ok Patient reports pain as mild.    Objective: Vital signs in last 24 hours: Temp:  [97.3 F (36.3 C)-98.4 F (36.9 C)] 97.3 F (36.3 C) (10/15 0541) Pulse Rate:  [80-84] 80 (10/15 0541) Resp:  [16-20] 16 (10/15 0541) BP: (141-156)/(69-81) 151/69 mmHg (10/15 0541) SpO2:  [96 %-100 %] 99 % (10/15 0541)  Labs: No results for input(s): HGB in the last 72 hours. No results for input(s): WBC, RBC, HCT, PLT in the last 72 hours.  Recent Labs  04/14/15 1047  NA 138  K 4.5  CL 105  CO2 26  BUN 9  CREATININE 0.88  GLUCOSE 137*  CALCIUM 8.7*   No results for input(s): LABPT, INR in the last 72 hours.  Physical Exam:  Neurologically intact ABD soft Neurovascular intact Sensation intact distally No cellulitis present Compartment soft  Assessment/Plan:  3 Days Post-Op Procedure(s) (LRB): DEBRIDEMENT OF ACHILLES TENDON LEFT, REMOVAL OF FIBERWIRE SUTURE (Left)  APPLICATION OF INCISIONAL WOUND VAC Advance diet Up with therapy Discharge home with home health later today after cultures are back ID team has selected an antibiotic for patient to be on long term. We would greatly appreciate it if the ID team could write the antibiotic Rx's for patients discharge. We greatly appreciate their help and management. He will follow up with Dr. Berenice Primas in office as scheduled.    Donald [name], Donald [name] 04/15/2015, 8:20 AM

## 2015-04-15 NOTE — Progress Notes (Signed)
ANTIBIOTIC CONSULT NOTE - FOLLOW UP  Pharmacy Consult for vancomycin Indication: wound infection  Allergies  Allergen Reactions  . Tape Rash and Other (See Comments)    White strips of tape     Patient Measurements: Height: 6\' 3"  (190.5 cm) Weight: (!) 335 lb (151.955 kg) IBW/kg (Calculated) : 84.5 Adjusted Body Weight: 111.5 kg  Vital Signs: Temp: 97.3 F (36.3 C) (10/15 0541) Temp Source: Oral (10/15 0541) BP: 151/69 mmHg (10/15 0541) Pulse Rate: 80 (10/15 0541) Intake/Output from previous day: 10/14 0701 - 10/15 0700 In: 360 [P.O.:360] Out: -  Intake/Output from this shift:    Labs:  Recent Labs  04/14/15 1047  CREATININE 0.88   Estimated Creatinine Clearance: 144.3 mL/min (by C-G formula based on Cr of 0.88).  Recent Labs  04/15/15 0625  [facility] 11     Microbiology: Recent Results (from the past 720 hour(s))  Anaerobic culture     Status: None (Preliminary result)   Collection Time: 04/12/15  5:40 PM  Result Value Ref Range Status   Specimen Description WOUND LEFT  Final   Special Requests ACHILLES TENDON  Final   Gram Stain   Final    FEW WBC PRESENT, PREDOMINANTLY PMN NO SQUAMOUS EPITHELIAL CELLS SEEN NO ORGANISMS SEEN Performed at [facility]    Culture   Final    NO ANAEROBES ISOLATED; CULTURE IN PROGRESS FOR 5 DAYS Performed at [facility]    Report Status PENDING  Incomplete  Wound culture     Status: None (Preliminary result)   Collection Time: 04/12/15  5:40 PM  Result Value Ref Range Status   Specimen Description WOUND LEFT  Final   Special Requests ACHILLES TENDON  Final   Gram Stain   Final    FEW WBC PRESENT, PREDOMINANTLY PMN NO SQUAMOUS EPITHELIAL CELLS SEEN NO ORGANISMS SEEN Performed at [facility]    Culture   Final    RARE GRAM NEGATIVE RODS Performed at [facility]    Report Status PENDING  Incomplete    Anti-infectives    Start     Dose/Rate Route Frequency Ordered Stop   04/14/15 1700  rifampin (RIFADIN) capsule 300 mg     300 mg Oral 2 times daily with meals 04/14/15 0947     04/14/15 1700  cefTRIAXone (ROCEPHIN) 2 g in dextrose 5 % 50 mL IVPB     2 g 100 mL/hr over 30 Minutes Intravenous Every 24 hours 04/14/15 1539     04/14/15 0700  vancomycin (VANCOCIN) 1,250 mg in sodium chloride 0.9 % 250 mL IVPB     1,250 mg 166.7 mL/hr over 90 Minutes Intravenous Every 12 hours 04/13/15 1746     04/13/15 1800  vancomycin (VANCOCIN) 2,000 mg in sodium chloride 0.9 % 500 mL IVPB     2,000 mg 250 mL/hr over 120 Minutes Intravenous  Once 04/13/15 1746 04/13/15 2100   04/13/15 0000  ceFAZolin (ANCEF) IVPB 2 g/50 mL premix  Status:  Discontinued     2 g 100 mL/hr over 30 Minutes Intravenous Every 8 hours 04/12/15 1953 04/13/15 1732   04/12/15 1656  ceFAZolin (ANCEF) 1-5 GM-% IVPB    Comments:  Sammuel Cooper   : cabinet override      04/12/15 1656 04/12/15 1740      Assessment: 50 yoM admitted 04/12/2015 with history of achilles tendon rupture (08/2014) with repair. He was admitted due to chronic wound drainage. Wound gram stain was obtained and is now  growing gram negative rods.  Day #3 vancomycin. Pt is without systemic symptoms, afebrile and no CBC ordered. Vanc trough this AM=11 (therapeutic). 10/14 SCr 0.88. BMET ordered q72h, can d/c if vancomycin is stopped.  Ceftriaxone started last night, ID will await further speciation prior to d/c with home PICC line.  Goal of Therapy:  Vancomycin trough level 10-15 mcg/ml  Plan:  - Continue vancomycin 1250 mg IV q12h - Continue ceftriaxone 2g IV q24h - Follow up culture results - Monitor Scr and clinical progression  Dimitri Ped, PharmD. Clinical Pharmacist Resident Pager: [phone]

## 2015-04-15 NOTE — Progress Notes (Addendum)
[place] for Infectious Disease    Date of Admission:  04/12/2015   Total days of antibiotics 4           ID: Donald [name] is a 59 y.o. male with  Principal Problem:   Draining postoperative wound Active Problems:   S/P Achilles tendon repair   Drainage from wound   Wound infection after surgery    Subjective: afebrile  Medications:  . cefTRIAXone (ROCEPHIN)  IV  2 g Intravenous Q24H  . docusate sodium  100 mg Oral BID  . losartan  100 mg Oral Daily  . rifampin  300 mg Oral BID WC  . vancomycin  1,250 mg Intravenous Q12H    Objective: Vital signs in last 24 hours: Temp:  [97.3 F (36.3 C)-98.4 F (36.9 C)] 97.3 F (36.3 C) (10/15 0541) Pulse Rate:  [80-84] 80 (10/15 0541) Resp:  [16-20] 16 (10/15 0541) BP: (141-156)/(69-81) 151/69 mmHg (10/15 0541) SpO2:  [96 %-100 %] 99 % (10/15 0541)    Lab Results  Recent Labs  04/14/15 1047  NA 138  K 4.5  CL 105  CO2 26  BUN 9  CREATININE 0.88   Sedimentation Rate  Recent Labs  04/14/15 0432  ESRSEDRATE 51*   C-Reactive Protein  Recent Labs  04/14/15 0432  CRP 4.1*    Microbiology: Wound cx showing enterobacter    Assessment/Plan: enterobacater Deep tissue infection = will plan to treat with ertapenem 1gm iv daily. Though the isolate is sensitive to cipro, will not prescribe it for long period due to risk for tendon rupture. Will give first dose of ertapenem today. Will discontinue other antibiotics. Plan to treat for 4 wks using 10/15 as day 1. We will see patient back in clinic in 3-4 wk. Plan for discharge today   I spent 25 min in coordination of care have discussed  [facility]    Prescriptions for home health: Diagnosis: Deep tissue infection of leg  Culture Result: enterobacter  Allergies  Allergen Reactions  . Tape Rash and Other (See Comments)    White strips of tape     Discharge antibiotics: Per pharmacy protocol   ertapenem Duration: 2 7 days Start date: Oct 16th ( he has received one dose in the hospital on oct 15th without difficulty) End Date: Nov 12th  [place] Per Protocol: Labs weekly while on IV antibiotics: _X_ CBC with differential _X_ BMP __ CRP __ ESR __ Vancomycin trough  Fax weekly labs to [phone]  Clinic Follow Up Appt: In 3-4 wk with Dr. Baxter Flattery @ [place]  @    [facility], [facility] for Infectious Diseases Cell: [phone] Pager: [phone]  04/15/2015, 10:11 AM

## 2015-04-16 NOTE — Progress Notes (Signed)
Cm faxed IV ABX order to [facility] pharmacy and called [facility] rep, Pamala Hurry to arrange for [facility] 04/16/15.  Pt has Provena vac.  No other CM needs were communicated.

## 2015-04-16 NOTE — Discharge Summary (Signed)
Patient ID: Donald [name] MRN: [id number] DOB/AGE: April 15, 1956 59 y.o.  Admit date: 04/12/2015 Discharge date: 04/15/2015  Admission Diagnoses:  Principal Problem:   Draining postoperative wound Active Problems:   S/P Achilles tendon repair   Drainage from wound   Wound infection after surgery   Infection due to Enterobacteriaceae   Discharge planning issues   Discharge Diagnoses:  Same  Past Medical History  Diagnosis Date  . Hypertension   . Bronchitis     HISTORY FEW MO AGO  . Arthritis   . Diverticulitis   . Colon polyps   . Hyperthyroidism     Surgeries: Procedure(s): DEBRIDEMENT OF ACHILLES TENDON LEFT, REMOVAL OF FIBERWIRE SUTURE  APPLICATION OF INCISIONAL WOUND VAC on 04/12/2015   Consultants:    Discharged Condition: Improved  Hospital Course: Donald [name] is an 59 y.o. male who was admitted 04/12/2015 for operative treatment ofDraining postoperative wound. Patient has severe unremitting pain that affects sleep, daily activities, and work/hobbies. After pre-op clearance the patient was taken to the operating room on 04/12/2015 and underwent  Procedure(s): DEBRIDEMENT OF ACHILLES TENDON LEFT, REMOVAL OF FIBERWIRE SUTURE  APPLICATION OF INCISIONAL WOUND VAC.    Patient was given perioperative antibiotics: Anti-infectives    Start     Dose/Rate Route Frequency Ordered Stop   04/15/15 1100  ertapenem (INVANZ) 1 g in sodium chloride 0.9 % 50 mL IVPB  Status:  Discontinued     1 g 100 mL/hr over 30 Minutes Intravenous Every 24 hours 04/15/15 1015 04/15/15 1732   04/15/15 0000  ertapenem 1 g in sodium chloride 0.9 % 50 mL  Status:  Discontinued     1 g 100 mL/hr over 30 Minutes Intravenous Every 24 hours 04/15/15 1018 04/15/15    04/15/15 0000  ertapenem 1 g in sodium chloride 0.9 % 50 mL     1 g 100 mL/hr over 30 Minutes Intravenous Every 24 hours 04/15/15 1145     04/14/15 1700  rifampin (RIFADIN) capsule 300 mg  Status:  Discontinued     300 mg Oral 2  times daily with meals 04/14/15 0947 04/15/15 1015   04/14/15 1700  cefTRIAXone (ROCEPHIN) 2 g in dextrose 5 % 50 mL IVPB  Status:  Discontinued     2 g 100 mL/hr over 30 Minutes Intravenous Every 24 hours 04/14/15 1539 04/15/15 1015   04/14/15 0700  vancomycin (VANCOCIN) 1,250 mg in sodium chloride 0.9 % 250 mL IVPB  Status:  Discontinued     1,250 mg 166.7 mL/hr over 90 Minutes Intravenous Every 12 hours 04/13/15 1746 04/15/15 1015   04/13/15 1800  vancomycin (VANCOCIN) 2,000 mg in sodium chloride 0.9 % 500 mL IVPB     2,000 mg 250 mL/hr over 120 Minutes Intravenous  Once 04/13/15 1746 04/13/15 2100   04/13/15 0000  ceFAZolin (ANCEF) IVPB 2 g/50 mL premix  Status:  Discontinued     2 g 100 mL/hr over 30 Minutes Intravenous Every 8 hours 04/12/15 1953 04/13/15 1732   04/12/15 1656  ceFAZolin (ANCEF) 1-5 GM-% IVPB    Comments:  Sammuel Cooper   : cabinet override      04/12/15 1656 04/12/15 1740       Patient was given sequential compression devices, early ambulation, and chemoprophylaxis to prevent DVT.  Patient benefited maximally from hospital stay and there were no complications.    Recent vital signs: Patient Vitals for the past 24 hrs:  BP Pulse  04/15/15 1046 135/71 mmHg 80  Recent laboratory studies:  Recent Labs  04/14/15 1047  NA 138  K 4.5  CL 105  CO2 26  BUN 9  CREATININE 0.88  GLUCOSE 137*  CALCIUM 8.7*     Discharge Medications:     Medication List    STOP taking these medications        diclofenac 75 MG EC tablet  Commonly known as:  VOLTAREN     HYDROcodone-acetaminophen 7.5-325 MG tablet  Commonly known as:  NORCO     ibuprofen 200 MG tablet  Commonly known as:  ADVIL,MOTRIN     predniSONE 50 MG tablet  Commonly known as:  DELTASONE      TAKE these medications        allopurinol 100 MG tablet  Commonly known as:  ZYLOPRIM  TAKE 1 TABLET (100 MG TOTAL) BY MOUTH DAILY.     ertapenem 1 g in sodium chloride 0.9 % 50 mL  Inject 1 g  into the vein daily.     loratadine 10 MG tablet  Commonly known as:  CLARITIN  Take 10 mg by mouth as needed for allergies.     losartan 100 MG tablet  Commonly known as:  COZAAR  TAKE 1 TABLET BY MOUTH EVERY DAY     nitroGLYCERIN 0.2 mg/hr patch  Commonly known as:  NITRODUR - Dosed in mg/24 hr  1/4 patch daily     oxyCODONE-acetaminophen 5-325 MG tablet  Commonly known as:  PERCOCET/ROXICET  Take 1-2 tablets by mouth every 6 (six) hours as needed for moderate pain or severe pain.     oxyCODONE-acetaminophen 5-325 MG tablet  Commonly known as:  PERCOCET/ROXICET  Take 1-2 tablets by mouth every 6 (six) hours as needed for severe pain.     traZODone 50 MG tablet  Commonly known as:  DESYREL  TAKE 1/2 - 1 TABLET BY MOUTH AT BEDTIME AS NEEDED FOR INSOMNIA        Diagnostic Studies: No results found.  Disposition: 01-Home or Self Care      Discharge Instructions    Call MD / Call 911    Complete by:  As directed   If you experience chest pain or shortness of breath, CALL 911 and be transported to the hospital emergency room.  If you develope a fever above 101 F, pus (white drainage) or increased drainage or redness at the wound, or calf pain, call your surgeon's office.     Constipation Prevention    Complete by:  As directed   Drink plenty of fluids.  Prune juice may be helpful.  You may use a stool softener, such as Colace (over the counter) 100 mg twice a day.  Use MiraLax (over the counter) for constipation as needed.     Diet - low sodium heart healthy    Complete by:  As directed      Discharge instructions    Complete by:  As directed   Remain non weightbearing, keep splint clean and dry until follow up with Dr. Berenice Primas.     Increase activity slowly as tolerated    Complete by:  As directed            Follow-up Information    Follow up with Shebra Muldrow L, MD. Schedule an appointment as soon as possible for a visit in 5 days.   Specialty:  Orthopedic Surgery    Contact information:   [place] [address] [phone]       Follow up  with [facility].   Why:  They will contact you to schedule home nurse visits.   Contact information:   [address] [place] [address] [phone]        Signed: Rich Fuchs 04/16/2015, 8:51 AM

## 2015-04-17 LAB — ANAEROBIC CULTURE

## 2015-04-18 ENCOUNTER — Other Ambulatory Visit: Payer: Self-pay | Admitting: Family Medicine

## 2015-04-21 ENCOUNTER — Telehealth: Payer: Self-pay | Admitting: Family Medicine

## 2015-04-21 ENCOUNTER — Ambulatory Visit: Payer: BLUE CROSS/BLUE SHIELD | Admitting: Family Medicine

## 2015-04-21 NOTE — Telephone Encounter (Signed)
Pt had to cancelled his appointment waiting on [facility] home care nurse . Pt just got release from [facility] on oct 15-16. Can I create 30 min slot?

## 2015-04-22 NOTE — Telephone Encounter (Signed)
Yes you can. Thanks.

## 2015-04-24 NOTE — Telephone Encounter (Signed)
Pt has been sch

## 2015-04-28 ENCOUNTER — Encounter: Payer: Self-pay | Admitting: Family Medicine

## 2015-04-28 ENCOUNTER — Ambulatory Visit (INDEPENDENT_AMBULATORY_CARE_PROVIDER_SITE_OTHER): Payer: BLUE CROSS/BLUE SHIELD | Admitting: Family Medicine

## 2015-04-28 VITALS — BP 142/92 | HR 83 | Temp 98.6°F | Wt 324.0 lb

## 2015-04-28 DIAGNOSIS — M109 Gout, unspecified: Secondary | ICD-10-CM | POA: Diagnosis not present

## 2015-04-28 DIAGNOSIS — T814XXS Infection following a procedure, sequela: Secondary | ICD-10-CM

## 2015-04-28 DIAGNOSIS — Z9889 Other specified postprocedural states: Secondary | ICD-10-CM

## 2015-04-28 DIAGNOSIS — I10 Essential (primary) hypertension: Secondary | ICD-10-CM | POA: Diagnosis not present

## 2015-04-28 DIAGNOSIS — IMO0001 Reserved for inherently not codable concepts without codable children: Secondary | ICD-10-CM

## 2015-04-28 NOTE — Patient Instructions (Addendum)
Thanks for updating me. What a crazy story. I am sorry you have been going through so much.   In regards to blood pressure, it is up which could be pain related. Your pressure has been controlled when not in pain. We decided if blood pressure remains above 140/90 when not in pain that we would add HCTZ to your losartan pill

## 2015-04-28 NOTE — Assessment & Plan Note (Signed)
S: poorly controlled. On Losartan 100mg  alone. Patient's last BP was taken when he was resting and pain was controlled. He admits to pain today from foot as well as R hip. He admits for years he has had issues with BP being around 140 over 90 or above BP Readings from Last 3 Encounters:  04/28/15 142/92  04/15/15 135/71  04/07/15 156/71   A/P: my advice was to add HCTZ to losartan. Patient already has some polyuria so would likely use 12.5mg . Patient declines at this time. States he is in pain- which could contribute. He opted to follow up with me once left leg healed and likely after his R hip replacement as well and agrees to adjustment if remains over 140/90

## 2015-04-28 NOTE — Assessment & Plan Note (Signed)
S: thought that calcific deposits on achilles tendon could be gout related. No obvious gout attacks in recent memory A/P: patient took himself off allopurinol, we will monitor for recurrence of gout. Consider uric acid with future labs even if not active to get baseline.

## 2015-04-28 NOTE — Assessment & Plan Note (Signed)
S: Seen by myself and Dr. Tamala Julian of sports medicine for achilles pain. Was put in a post op boot eventually and while wearing boot and climbing stairs heard a "pop". Initially hurt but actually notices he felt better when walking around after that but sought care next day with Dr. Tamala Julian. Referred to Dr. Berenice Primas. Had achilles tendon repair but wound would not close. Had to have debridement and thought was that patient was allergic to kevlar suture. ID was involved Dr. Baxter Flattery dut to infection with enterobacter at wound site. Patient was placed on 4 week course of ertapenem at home through PICC line and has appointment with Dr. Baxter Flattery on the 9th as well as Dr. Berenice Primas next week. Wound has not healed completely on last check- is only changed/evaluated by Dr. Berenice Primas A/P: encouraged continued care by ID and ortho. Patient also having pain from R hip. Using perocet for both but pain not always controlled well such as today as has not had recent dose (needed to drive). He is getting weekly bloodwork with CBC and BMET sent to Dr. Baxter Flattery through home health. Answered all questions and concerns I was able but will get additional info from Dr. Baxter Flattery and Berenice Primas

## 2015-04-28 NOTE — Progress Notes (Signed)
Donald Reddish, Donald [name]  Subjective:  Donald [name] is a 59 y.o. year old very pleasant male patient who presents for/with See problem oriented charting ROS- No chest pain or shortness of breath. No headache or blurry vision. Pain reported in R hip primarily but also some in left foot (improving)  Past Medical History-  Patient Active Problem List   Diagnosis Date Noted  . Gout, unspecified 05/11/2010    Priority: Medium  . Essential hypertension 12/14/2009    Priority: Medium  . S/P Achilles tendon repair 04/12/2015    Priority: Low  . Achilles tendinitis of both lower extremities 06/20/2014    Priority: Low  . Leg length difference, acquired 06/20/2014    Priority: Low  . Osteoarthritis of left knee 01/08/2013    Priority: Low  . Allergic rhinitis, seasonal 11/04/2012    Priority: Low  . INSOMNIA-SLEEP DISORDER-UNSPEC 01/09/2010    Priority: Low  . Infection due to Enterobacteriaceae   . Wound infection after surgery 04/14/2015  . Draining postoperative wound 04/12/2015  . Hematochezia 04/07/2013  . Asthmatic bronchitis 11/04/2012    Medications- reviewed and updated Current Outpatient Prescriptions  Medication Sig Dispense Refill  . aspirin 81 MG tablet Take 81 mg by mouth daily.    . ertapenem 1 g in sodium chloride 0.9 % 50 mL Inject 1 g into the vein daily. 27 Dose 0  . losartan (COZAAR) 100 MG tablet TAKE 1 TABLET BY MOUTH EVERY DAY (Patient taking differently: TAKE 100 MG BY MOUTH EVERY DAY) 90 tablet 2  . traZODone (DESYREL) 50 MG tablet TAKE 1/2 - 1 TABLET BY MOUTH AT BEDTIME AS NEEDED FOR INSOMNIA 30 tablet 5  . oxyCODONE-acetaminophen (PERCOCET/ROXICET) 5-325 MG tablet Take 1-2 tablets by mouth every 6 (six) hours as needed for moderate pain or severe pain. (Patient not taking: Reported on 04/28/2015) 50 tablet 0   No current facility-administered medications for this visit.    Objective: BP 142/92 mmHg  Pulse 83  Temp(Src) 98.6 F (37 C)  Wt 324 lb (146.965  kg) Gen: NAD, resting comfortably CV: RRR no murmurs rubs or gallops Lungs: CTAB no crackles, wheeze, rhonchi Abdomen: soft/nontender/nondistended/normal bowel sounds. No rebound or guarding.  Ext: no edema. L leg walking boot. Walks with limp.  Skin: warm, dry Neuro: grossly normal, moves all extremities  Assessment/Plan:  Gout, unspecified S: thought that calcific deposits on achilles tendon could be gout related. No obvious gout attacks in recent memory A/P: patient took himself off allopurinol, we will monitor for recurrence of gout. Consider uric acid with future labs even if not active to get baseline.    Essential hypertension S: poorly controlled. On Losartan 100mg  alone. Patient's last BP was taken when he was resting and pain was controlled. He admits to pain today from foot as well as R hip. He admits for years he has had issues with BP being around 140 over 90 or above BP Readings from Last 3 Encounters:  04/28/15 142/92  04/15/15 135/71  04/07/15 156/71   A/P: my advice was to add HCTZ to losartan. Patient already has some polyuria so would likely use 12.5mg . Patient declines at this time. States he is in pain- which could contribute. He opted to follow up with me once left leg healed and likely after his R hip replacement as well and agrees to adjustment if remains over 140/90   Wound infection after surgery S: Seen by myself and Dr. Tamala Julian of sports medicine for achilles pain. Was put  in a post op boot eventually and while wearing boot and climbing stairs heard a "pop". Initially hurt but actually notices he felt better when walking around after that but sought care next day with Dr. Tamala Julian. Referred to Dr. Berenice Primas. Had achilles tendon repair but wound would not close. Had to have debridement and thought was that patient was allergic to kevlar suture. ID was involved Dr. Baxter Flattery dut to infection with enterobacter at wound site. Patient was placed on 4 week course of ertapenem at  home through PICC line and has appointment with Dr. Baxter Flattery on the 9th as well as Dr. Berenice Primas next week. Wound has not healed completely on last check- is only changed/evaluated by Dr. Berenice Primas A/P: encouraged continued care by ID and ortho. Patient also having pain from R hip. Using perocet for both but pain not always controlled well such as today as has not had recent dose (needed to drive). He is getting weekly bloodwork with CBC and BMET sent to Dr. Baxter Flattery through home health. Answered all questions and concerns I was able but will get additional info from Dr. Baxter Flattery and Berenice Primas  Patient states he will follow up with me after healing from wound infection and after R hip replacement which he is hoping to do in January.   I advised daily aspirin to lower cardiac risk especially with hypertension above goal I would like and patient not willing to adjust medicine- he is aware of risks. Needs repeat lipids soon-last 2011 Meds ordered this encounter  Medications  . aspirin 81 MG tablet    Sig: Take 81 mg by mouth daily.

## 2015-05-10 ENCOUNTER — Ambulatory Visit (INDEPENDENT_AMBULATORY_CARE_PROVIDER_SITE_OTHER): Payer: BLUE CROSS/BLUE SHIELD | Admitting: Internal Medicine

## 2015-05-10 ENCOUNTER — Encounter: Payer: Self-pay | Admitting: Internal Medicine

## 2015-05-10 ENCOUNTER — Telehealth: Payer: Self-pay | Admitting: *Deleted

## 2015-05-10 VITALS — BP 139/82 | HR 82 | Temp 98.8°F | Ht 75.0 in | Wt 326.0 lb

## 2015-05-10 DIAGNOSIS — A498 Other bacterial infections of unspecified site: Secondary | ICD-10-CM

## 2015-05-10 MED ORDER — CIPROFLOXACIN HCL 500 MG PO TABS: 500.0000 mg | ORAL_TABLET | Freq: Two times a day (BID) | ORAL | Status: DC

## 2015-05-10 NOTE — Progress Notes (Signed)
RFV: hospital follow up for tendon repatir, wound infection Subjective:    Patient ID: Donald [name], male    DOB: [birth year], 59 y.o.   MRN: [id number]  HPI Donald [name] is a 59 yo M who had enterobacter wound infection post achilles tendon repair. He was discharged on 4 wk of ertapenem to end next week. He states that his wound is slowly healing. Still has some drainage. No fever, chills, diarrhea.  Allergies  Allergen Reactions  . Tape Rash and Other (See Comments)    White strips of tape    Current Outpatient Prescriptions on File Prior to Visit  Medication Sig Dispense Refill  . aspirin 81 MG tablet Take 81 mg by mouth daily.    . ertapenem 1 g in sodium chloride 0.9 % 50 mL Inject 1 g into the vein daily. 27 Dose 0  . losartan (COZAAR) 100 MG tablet TAKE 1 TABLET BY MOUTH EVERY DAY (Patient taking differently: TAKE 100 MG BY MOUTH EVERY DAY) 90 tablet 2  . oxyCODONE-acetaminophen (PERCOCET/ROXICET) 5-325 MG tablet Take 1-2 tablets by mouth every 6 (six) hours as needed for moderate pain or severe pain. 50 tablet 0  . traZODone (DESYREL) 50 MG tablet TAKE 1/2 - 1 TABLET BY MOUTH AT BEDTIME AS NEEDED FOR INSOMNIA 30 tablet 5   No current facility-administered medications on file prior to visit.   Active Ambulatory Problems    Diagnosis Date Noted  . Gout, unspecified 05/11/2010  . Essential hypertension 12/14/2009  . INSOMNIA-SLEEP DISORDER-UNSPEC 01/09/2010  . Asthmatic bronchitis 11/04/2012  . Allergic rhinitis, seasonal 11/04/2012  . Osteoarthritis of left knee 01/08/2013  . Hematochezia 04/07/2013  . Achilles tendinitis of both lower extremities 06/20/2014  . Leg length difference, acquired 06/20/2014  . Draining postoperative wound 04/12/2015  . S/P Achilles tendon repair 04/12/2015  . Wound infection after surgery 04/14/2015  . Infection due to Enterobacteriaceae    Resolved Ambulatory Problems    Diagnosis Date Noted  . Diverticulosis 04/07/2013  . Colitis  presumed to be due to infection 04/07/2013  . Abdominal pain, acute 04/07/2013  . Protein-calorie malnutrition, severe ([facility]) 04/08/2013  . Retrocalcaneal bursitis 07/29/2014  . Achilles rupture, left 08/18/2014  . Drainage from wound 04/12/2015  . Discharge planning issues    Past Medical History  Diagnosis Date  . Hypertension   . Bronchitis   . Arthritis   . Diverticulitis   . Colon polyps   . Hyperthyroidism      Review of Systems Review of Systems  Constitutional: Negative for fever, chills, diaphoresis, activity change, appetite change, fatigue and unexpected weight change.  HENT: Negative for congestion, sore throat, rhinorrhea, sneezing, trouble swallowing and sinus pressure.  Eyes: Negative for photophobia and visual disturbance.  Respiratory: Negative for cough, chest tightness, shortness of breath, wheezing and stridor.  Cardiovascular: Negative for chest pain, palpitations and leg swelling.  Gastrointestinal: Negative for nausea, vomiting, abdominal pain, diarrhea, constipation, blood in stool, abdominal distention and anal bleeding.  Genitourinary: Negative for dysuria, hematuria, flank pain and difficulty urinating.  Musculoskeletal: Negative for myalgias, back pain, joint swelling, arthralgias and gait problem.  Skin: Negative for color change, pallor, rash and wound.  Neurological: Negative for dizziness, tremors, weakness and light-headedness.  Hematological: Negative for adenopathy. Does not bruise/bleed easily.  Psychiatric/Behavioral: Negative for behavioral problems, confusion, sleep disturbance, dysphoric mood, decreased concentration and agitation.       Objective:   Physical Exam  BP 139/82 mmHg  Pulse 82  Temp(Src) 98.8 F (  37.1 C) (Oral)  Ht 6\' 3"  (1.905 m)  Wt 326 lb (147.873 kg)  BMI 40.75 kg/m2 gen = a xo by 3 in NAD Skin = left llower leg =incisional wound is 4cm long x 2cm wide, 0.35cm deep. Mild yellowish fibrinous exudate in the  wound  Labs:  Erythrocyte Sedimentation Rate     Component Value Date/Time   ESRSEDRATE 51* 04/14/2015 0432   Lab Results  Component Value Date   CRP 4.1* 04/14/2015    Culture & Susceptibility      ENTEROBACTER CLOACAE     Antibiotic Sensitivity Microscan Status    CEFAZOLIN Resistant >=64 RESISTANT Final    Method: MIC    CEFEPIME Sensitive <=1 SENSITIVE Final    Method: MIC    CEFTAZIDIME Resistant >=64 RESISTANT Final    Method: MIC    CEFTRIAXONE Resistant >=64 RESISTANT Final    Method: MIC    CIPROFLOXACIN Sensitive <=0.25 SENSITIVE Final    Method: MIC    GENTAMICIN Sensitive <=1 SENSITIVE Final    Method: MIC    IMIPENEM Sensitive <=0.25 SENSITIVE Final    Method: MIC    PIP/TAZO Resistant >=128 RESISTANT Final    Method: MIC    TOBRAMYCIN Sensitive <=1 SENSITIVE Final    Method: MIC    TRIMETH/SULFA Sensitive <=20 SENSITIVE (NOTE)                Assessment & Plan:  Enterobacter = he finishes 4 wk of ertapenem of nov 14th. We will give him addn 10 days of ciprofloxacin to wrap up.   Return to clinic in 10 d

## 2015-05-10 NOTE — Telephone Encounter (Signed)
Verbal order per Dr. Baxter Flattery given to [facility] at [facility] to pull patient's picc line at the end of treatment. Also to draw a sed rate, crp, bmp, and cbc with diff at next blood draw. Myrtis Hopping

## 2015-05-15 ENCOUNTER — Encounter: Payer: Self-pay | Admitting: Internal Medicine

## 2015-05-18 ENCOUNTER — Ambulatory Visit (INDEPENDENT_AMBULATORY_CARE_PROVIDER_SITE_OTHER): Payer: BLUE CROSS/BLUE SHIELD | Admitting: Internal Medicine

## 2015-05-18 ENCOUNTER — Encounter: Payer: Self-pay | Admitting: Internal Medicine

## 2015-05-18 VITALS — BP 132/86 | HR 70 | Temp 98.0°F | Wt 327.0 lb

## 2015-05-18 DIAGNOSIS — A498 Other bacterial infections of unspecified site: Secondary | ICD-10-CM

## 2015-05-18 LAB — CBC WITH DIFFERENTIAL/PLATELET
BASOS ABS: 0 10*3/uL (ref 0.0–0.1)
Basophils Relative: 0 % (ref 0–1)
EOS ABS: 0.3 10*3/uL (ref 0.0–0.7)
EOS PCT: 4 % (ref 0–5)
HEMATOCRIT: 41.9 % (ref 39.0–52.0)
Hemoglobin: 14 g/dL (ref 13.0–17.0)
LYMPHS ABS: 3 10*3/uL (ref 0.7–4.0)
Lymphocytes Relative: 35 % (ref 12–46)
MCH: 29.5 pg (ref 26.0–34.0)
MCHC: 33.4 g/dL (ref 30.0–36.0)
MCV: 88.2 fL (ref 78.0–100.0)
MONO ABS: 0.7 10*3/uL (ref 0.1–1.0)
MPV: 9.1 fL (ref 8.6–12.4)
Monocytes Relative: 8 % (ref 3–12)
Neutro Abs: 4.5 10*3/uL (ref 1.7–7.7)
Neutrophils Relative %: 53 % (ref 43–77)
PLATELETS: 273 10*3/uL (ref 150–400)
RBC: 4.75 MIL/uL (ref 4.22–5.81)
RDW: 13.6 % (ref 11.5–15.5)
WBC: 8.5 10*3/uL (ref 4.0–10.5)

## 2015-05-18 LAB — C-REACTIVE PROTEIN: CRP: 0.8 mg/dL — ABNORMAL HIGH (ref <0.60)

## 2015-05-18 LAB — BASIC METABOLIC PANEL
BUN: 14 mg/dL (ref 7–25)
CO2: 24 mmol/L (ref 20–31)
CREATININE: 0.91 mg/dL (ref 0.70–1.33)
Calcium: 9.4 mg/dL (ref 8.6–10.3)
Chloride: 103 mmol/L (ref 98–110)
GLUCOSE: 91 mg/dL (ref 65–99)
Potassium: 5 mmol/L (ref 3.5–5.3)
Sodium: 140 mmol/L (ref 135–146)

## 2015-05-18 NOTE — Progress Notes (Signed)
RFV: hospital follow up for enterobacter deep tissue infection Subjective:    Patient ID: Donald [name], male    DOB: February 24, 1956, 59 y.o.   MRN: [id number]  HPI  Donald [name] is a 59 y.o. male with history of achilles tendon rupture in 08/2014 with repair and fiberwire suturing who had chronic wound draining since that time. This failed to improved and inflammation worsened, although never developed systemic symptoms such as fever, leukocytosis. He was admitted for debridement of the site 10/12 and had the majority of suture material excised as well as inflamed appearing soft tissue. Wound vac placed and ankle put into splint and compression. Wound culture grew enterobacter for which he was discharged on 4 wk of ertapenem for ease of administration. His wound is slowly healing. Wound bed has good granulation tissue but he reports using peroxide to apply to the wound. When we saw him last week, we changed him from ertapenem to finish out a 10 day course of ciprofloxacin. No drainage from wound  Allergies  Allergen Reactions  . Tape Rash and Other (See Comments)    White strips of tape    Current Outpatient Prescriptions on File Prior to Visit  Medication Sig Dispense Refill  . aspirin 81 MG tablet Take 81 mg by mouth daily.    . ciprofloxacin (CIPRO) 500 MG tablet Take 1 tablet (500 mg total) by mouth 2 (two) times daily. Start taking after finishing IV antibiotics 20 tablet 0  . losartan (COZAAR) 100 MG tablet TAKE 1 TABLET BY MOUTH EVERY DAY (Patient taking differently: TAKE 100 MG BY MOUTH EVERY DAY) 90 tablet 2  . oxyCODONE-acetaminophen (PERCOCET/ROXICET) 5-325 MG tablet Take 1-2 tablets by mouth every 6 (six) hours as needed for moderate pain or severe pain. 50 tablet 0  . traZODone (DESYREL) 50 MG tablet TAKE 1/2 - 1 TABLET BY MOUTH AT BEDTIME AS NEEDED FOR INSOMNIA 30 tablet 5   No current facility-administered medications on file prior to visit.   Active Ambulatory Problems   Diagnosis Date Noted  . Gout, unspecified 05/11/2010  . Essential hypertension 12/14/2009  . INSOMNIA-SLEEP DISORDER-UNSPEC 01/09/2010  . Asthmatic bronchitis 11/04/2012  . Allergic rhinitis, seasonal 11/04/2012  . Osteoarthritis of left knee 01/08/2013  . Hematochezia 04/07/2013  . Achilles tendinitis of both lower extremities 06/20/2014  . Leg length difference, acquired 06/20/2014  . Draining postoperative wound 04/12/2015  . S/P Achilles tendon repair 04/12/2015  . Wound infection after surgery 04/14/2015  . Infection due to Enterobacteriaceae    Resolved Ambulatory Problems    Diagnosis Date Noted  . Diverticulosis 04/07/2013  . Colitis presumed to be due to infection 04/07/2013  . Abdominal pain, acute 04/07/2013  . Protein-calorie malnutrition, severe ([place]) 04/08/2013  . Retrocalcaneal bursitis 07/29/2014  . Achilles rupture, left 08/18/2014  . Drainage from wound 04/12/2015  . Discharge planning issues    Past Medical History  Diagnosis Date  . Hypertension   . Bronchitis   . Arthritis   . Diverticulitis   . Colon polyps   . Hyperthyroidism      Review of Systems  Constitutional: Negative for fever, chills, diaphoresis, activity change, appetite change, fatigue and unexpected weight change.  HENT: Negative for congestion, sore throat, rhinorrhea, sneezing, trouble swallowing and sinus pressure.  Eyes: Negative for photophobia and visual disturbance.  Respiratory: Negative for cough, chest tightness, shortness of breath, wheezing and stridor.  Cardiovascular: Negative for chest pain, palpitations and leg swelling.  Gastrointestinal: Negative for nausea, vomiting, abdominal  pain, diarrhea, constipation, blood in stool, abdominal distention and anal bleeding.  Genitourinary: Negative for dysuria, hematuria, flank pain and difficulty urinating.  Musculoskeletal: Negative for myalgias, back pain, joint swelling, arthralgias and gait problem.  Skin: Negative for color  change, pallor, rash and wound.  Neurological: Negative for dizziness, tremors, weakness and light-headedness.  Hematological: Negative for adenopathy. Does not bruise/bleed easily.  Psychiatric/Behavioral: Negative for behavioral problems, confusion, sleep disturbance, dysphoric mood, decreased concentration and agitation.       Objective:   Physical Exam BP 132/86 mmHg  Pulse 70  Temp(Src) 98 F (36.7 C) (Oral)  Wt 327 lb (148.326 kg) gen = a xo by 3 in nad Skin = left lower leg/achilles region. slowly healing elliptical lesion. Wound bed good granulation tissue  Lab Results  Component Value Date   ESRSEDRATE 51* 04/14/2015   Lab Results  Component Value Date   CRP 0.8* 05/18/2015        Assessment & Plan:  Enterobacter infection of deep tissue after achilles tendon repair = will check sed rate and crp. If still elevated, will extend cipro by additional 10-14 days

## 2015-05-19 LAB — SEDIMENTATION RATE: Sed Rate: 15 mm/hr (ref 0–20)

## 2015-07-28 ENCOUNTER — Encounter: Payer: Self-pay | Admitting: Family Medicine

## 2015-07-28 ENCOUNTER — Ambulatory Visit (INDEPENDENT_AMBULATORY_CARE_PROVIDER_SITE_OTHER): Payer: BLUE CROSS/BLUE SHIELD | Admitting: Family Medicine

## 2015-07-28 VITALS — BP 136/88 | HR 72 | Temp 97.9°F | Wt 324.0 lb

## 2015-07-28 DIAGNOSIS — Z20828 Contact with and (suspected) exposure to other viral communicable diseases: Secondary | ICD-10-CM

## 2015-07-28 DIAGNOSIS — E785 Hyperlipidemia, unspecified: Secondary | ICD-10-CM

## 2015-07-28 DIAGNOSIS — I10 Essential (primary) hypertension: Secondary | ICD-10-CM | POA: Diagnosis not present

## 2015-07-28 NOTE — Progress Notes (Signed)
Garret Reddish, MD  Subjective:  Donald [name] is a 60 y.o. year old very pleasant male patient who presents for/with See problem oriented charting ROS- No chest pain or shortness of breath with activity or at risk. No headache or blurry vision.    Past Medical History-  Gout, hypertension, hyperlipidemia, arthritis knees  Medications- reviewed and updated Current Outpatient Prescriptions  Medication Sig Dispense Refill  . aspirin 81 MG tablet Take 81 mg by mouth daily.    Marland Kitchen losartan (COZAAR) 100 MG tablet TAKE 1 TABLET BY MOUTH EVERY DAY (Patient taking differently: TAKE 100 MG BY MOUTH EVERY DAY) 90 tablet 2  . oxyCODONE-acetaminophen (PERCOCET/ROXICET) 5-325 MG tablet Take 1-2 tablets by mouth every 6 (six) hours as needed for moderate pain or severe pain. (Patient not taking: Reported on 07/28/2015) 50 tablet 0  . traZODone (DESYREL) 50 MG tablet TAKE 1/2 - 1 TABLET BY MOUTH AT BEDTIME AS NEEDED FOR INSOMNIA (Patient not taking: Reported on 07/28/2015) 30 tablet 5   Objective: BP 136/88 mmHg  Pulse 72  Temp(Src) 97.9 F (36.6 C)  Wt 324 lb (146.965 kg) Gen: NAD, resting comfortably CV: RRR no murmurs rubs or gallops Lungs: CTAB no crackles, wheeze, rhonchi Abdomen: soft/nontender/nondistended/normal bowel sounds. No rebound or guarding.  Ext: no edema Skin: warm, dry Neuro: grossly normal, moves all extremities,  limp when walking on on right leg  Assessment/Plan:  Patient presents for preoperative evaluation. He is medically maximized for surgery in regards to blood pressure at goal <140/90. He is able to complete 4 mets without difficulty. I would like to update lipids to see if patient would benefit from a statin at least perioperatively with longterm plan for weight loss after healing form right total hip arthroplasty. Whether he is or is not on statin should not prevent surgery from moving forward.   Essential hypertension S: controlled on losartan 100mg  on repeat  evaluation with appropriate sized large cuff BP Readings from Last 3 Encounters:  07/28/15 136/88  05/18/15 132/86  05/10/15 139/82  A/P:Continue current meds. Medically maximized as long as BP remains <140/90  Dyslipidemia S: previously controlled on no medicine but did have low HDL. No recent testing. Patient states he can complete 4 mets (up hill or up stairs briskly) without chest pain or shortness of breath Lab Results  Component Value Date   CHOL 153 03/26/2010   HDL 38.40* 03/26/2010   LDLCALC 93 03/26/2010   TRIG 108.0 03/26/2010   CHOLHDL 4 03/26/2010   A/P: we will repeat fasting lipids- will include in preop anesthesia labs if possible-otherwise will get these next Friday. Calculate 10 year risk and consider statin if >7.5% at least short term around surgery to lower risk. Already on aspirin and will continue  Follow up within 6 months of surgery. Return precautions advised.   Orders Placed This Encounter  Procedures  . Lipid panel    Standing Status: Future     Number of Occurrences:      Standing Expiration Date: 07/27/2016  . HIV antibody    Standing Status: Future     Number of Occurrences:      Standing Expiration Date: 07/27/2016  . Hepatitis C antibody, reflex    [facility]    Standing Status: Future     Number of Occurrences:      Standing Expiration Date: 07/27/2016

## 2015-07-28 NOTE — Assessment & Plan Note (Signed)
S: controlled on losartan 100mg  on repeat evaluation with appropriate sized large cuff BP Readings from Last 3 Encounters:  07/28/15 136/88  05/18/15 132/86  05/10/15 139/82  A/P:Continue current meds. Medically maximized as long as BP remains <140/90

## 2015-07-28 NOTE — Patient Instructions (Signed)
I am going to send a medical maximization/clearance form to your orthopedist to proceed with surgery. Your blood pressure is controlled. i want to update your cholesterol to see if we will need to recommend a statin for about a month (before surgery through whenever prescription runs out). After that you may want to come off and continue to work on weight loss to see if you could come off medicine (this assumes it has increased from prior when it was reasonable though)  I ordered some labs which you can schedule with lab early next Friday. Ask the preoperative evaluation at [facility] to see if they can add these labs (HIV, hepatitis C, and lipid panel) if they are going to do labs on you and to send me results.   I want to see you within 6 months of your surgery again

## 2015-07-31 ENCOUNTER — Other Ambulatory Visit: Payer: Self-pay | Admitting: Orthopedic Surgery

## 2015-08-02 ENCOUNTER — Telehealth: Payer: Self-pay | Admitting: Family Medicine

## 2015-08-02 NOTE — Telephone Encounter (Signed)
Faxed again

## 2015-08-02 NOTE — Telephone Encounter (Signed)
This has been faxed several times, Juliann Pulse is faxing again. Im not sure why they are not receiving this form, please see if they have an alternate fax number.

## 2015-08-02 NOTE — Telephone Encounter (Signed)
Fax [phone]   Attn: [facility] states they did not receive a letter that states pt is cleared for surgery. Can you re fax if you did send, and must state pt is cleared to have surgery by Dr Berenice Primas.

## 2015-08-03 NOTE — Pre-Procedure Instructions (Signed)
Donald [name]  08/03/2015     Your procedure is scheduled on : Monday August 14, 2015 at 12:30 PM.  Report to [facility] Admitting at 10:30 AM.  Call this number if you have problems the morning of surgery: [phone]    Remember:  Do not eat food or drink liquids after midnight.  Take these medicines the morning of surgery with A SIP OF WATER : Allopurinol (Zyloprim) if needed, Oxycodone if needed   Stop taking any vitamins, herbal medications/supplements, NSAIDs, Ibuprofen, Advil, Motrin, Aleve, etc on Monday February 6th (1 week prior to surgery)    Do not wear jewelry.  Do not wear lotions, powders, or cologne.    Men may shave face and neck.  Do not bring valuables to the hospital.  [facility] is not responsible for any belongings or valuables.  Contacts, dentures or bridgework may not be worn into surgery.  Leave your suitcase in the car.  After surgery it may be brought to your room.  For patients admitted to the hospital, discharge time will be determined by your treatment team.  Patients discharged the day of surgery will not be allowed to drive home.   Name and phone number of your driver:    Special instructions:  Shower using CHG soap the night before and the morning of your surgery  Please read over the following fact sheets that you were given. Pain Booklet, Coughing and Deep Breathing, Total Joint Packet, MRSA Information and Surgical Site Infection Prevention

## 2015-08-04 ENCOUNTER — Other Ambulatory Visit (INDEPENDENT_AMBULATORY_CARE_PROVIDER_SITE_OTHER): Payer: BLUE CROSS/BLUE SHIELD

## 2015-08-04 ENCOUNTER — Encounter (HOSPITAL_COMMUNITY): Payer: Self-pay

## 2015-08-04 ENCOUNTER — Other Ambulatory Visit: Payer: Self-pay | Admitting: Family Medicine

## 2015-08-04 ENCOUNTER — Ambulatory Visit (HOSPITAL_COMMUNITY)
Admission: RE | Admit: 2015-08-04 | Discharge: 2015-08-04 | Disposition: A | Discharge status: Home / Self Care | Payer: BLUE CROSS/BLUE SHIELD | Source: Ambulatory Visit | Attending: Orthopedic Surgery | Admitting: Orthopedic Surgery

## 2015-08-04 ENCOUNTER — Encounter (HOSPITAL_COMMUNITY)
Admission: RE | Admit: 2015-08-04 | Discharge: 2015-08-04 | Disposition: A | Discharge status: Home / Self Care | Payer: BLUE CROSS/BLUE SHIELD | Source: Ambulatory Visit | Attending: Orthopedic Surgery | Admitting: Orthopedic Surgery

## 2015-08-04 DIAGNOSIS — E785 Hyperlipidemia, unspecified: Secondary | ICD-10-CM | POA: Diagnosis not present

## 2015-08-04 DIAGNOSIS — Z01812 Encounter for preprocedural laboratory examination: Secondary | ICD-10-CM | POA: Insufficient documentation

## 2015-08-04 DIAGNOSIS — M1611 Unilateral primary osteoarthritis, right hip: Secondary | ICD-10-CM | POA: Diagnosis not present

## 2015-08-04 DIAGNOSIS — Z20828 Contact with and (suspected) exposure to other viral communicable diseases: Secondary | ICD-10-CM

## 2015-08-04 DIAGNOSIS — Z01818 Encounter for other preprocedural examination: Secondary | ICD-10-CM | POA: Insufficient documentation

## 2015-08-04 HISTORY — DX: Gout, unspecified: M10.9

## 2015-08-04 HISTORY — DX: Noninfective gastroenteritis and colitis, unspecified: K52.9

## 2015-08-04 LAB — CBC WITH DIFFERENTIAL/PLATELET
BASOS ABS: 0 10*3/uL (ref 0.0–0.1)
Basophils Relative: 1 %
EOS ABS: 0.1 10*3/uL (ref 0.0–0.7)
EOS PCT: 1 %
HCT: 40.1 % (ref 39.0–52.0)
Hemoglobin: 14 g/dL (ref 13.0–17.0)
LYMPHS ABS: 3.6 10*3/uL (ref 0.7–4.0)
Lymphocytes Relative: 42 %
MCH: 30.8 pg (ref 26.0–34.0)
MCHC: 34.9 g/dL (ref 30.0–36.0)
MCV: 88.1 fL (ref 78.0–100.0)
Monocytes Absolute: 0.6 10*3/uL (ref 0.1–1.0)
Monocytes Relative: 6 %
Neutro Abs: 4.3 10*3/uL (ref 1.7–7.7)
Neutrophils Relative %: 50 %
PLATELETS: 339 10*3/uL (ref 150–400)
RBC: 4.55 MIL/uL (ref 4.22–5.81)
RDW: 13 % (ref 11.5–15.5)
WBC: 8.6 10*3/uL (ref 4.0–10.5)

## 2015-08-04 LAB — COMPREHENSIVE METABOLIC PANEL
ALT: 34 U/L (ref 17–63)
AST: 28 U/L (ref 15–41)
Albumin: 4.2 g/dL (ref 3.5–5.0)
Alkaline Phosphatase: 57 U/L (ref 38–126)
Anion gap: 11 (ref 5–15)
BUN: 12 mg/dL (ref 6–20)
CHLORIDE: 105 mmol/L (ref 101–111)
CO2: 25 mmol/L (ref 22–32)
CREATININE: 1 mg/dL (ref 0.61–1.24)
Calcium: 9.2 mg/dL (ref 8.9–10.3)
GFR calc non Af Amer: >60 mL/min (ref >60)
Glucose, Bld: 95 mg/dL (ref 65–99)
POTASSIUM: 4.2 mmol/L (ref 3.5–5.1)
SODIUM: 141 mmol/L (ref 135–145)
Total Bilirubin: 0.3 mg/dL (ref 0.3–1.2)
Total Protein: 7.2 g/dL (ref 6.5–8.1)

## 2015-08-04 LAB — LIPID PANEL
Cholesterol: 129 mg/dL (ref 0–200)
HDL: 40.8 mg/dL (ref >39.00)
LDL Cholesterol: 65 mg/dL (ref 0–99)
NONHDL: 88.29
Total CHOL/HDL Ratio: 3
Triglycerides: 115 mg/dL (ref 0.0–149.0)
VLDL: 23 mg/dL (ref 0.0–40.0)

## 2015-08-04 LAB — SURGICAL PCR SCREEN
MRSA, PCR: NEGATIVE
Staphylococcus aureus: NEGATIVE

## 2015-08-04 LAB — URINALYSIS, ROUTINE W REFLEX MICROSCOPIC
Bilirubin Urine: NEGATIVE
GLUCOSE, UA: NEGATIVE mg/dL
Hgb urine dipstick: NEGATIVE
KETONES UR: NEGATIVE mg/dL
LEUKOCYTES UA: NEGATIVE
NITRITE: NEGATIVE
PROTEIN: NEGATIVE mg/dL
Specific Gravity, Urine: 1.012 (ref 1.005–1.030)
pH: 5.5 (ref 5.0–8.0)

## 2015-08-04 LAB — APTT: APTT: 29 s (ref 24–37)

## 2015-08-04 LAB — PROTIME-INR
INR: 1.13 (ref 0.00–1.49)
Prothrombin Time: 14.7 seconds (ref 11.6–15.2)

## 2015-08-04 NOTE — Progress Notes (Signed)
PCP is Garret Reddish  Patient denied having any acute cardiac or pulmonary issues

## 2015-08-05 LAB — HEPATITIS C ANTIBODY: HCV Ab: NEGATIVE

## 2015-08-05 LAB — HIV ANTIBODY (ROUTINE TESTING W REFLEX): HIV 1&2 Ab, 4th Generation: NONREACTIVE

## 2015-08-11 MED ORDER — CHLORHEXIDINE GLUCONATE 4 % EX LIQD: 60.0000 mL | Freq: Once | CUTANEOUS | Status: DC

## 2015-08-11 MED ORDER — DEXTROSE 5 % IV SOLN
3.0000 g | INTRAVENOUS | Status: AC
  Administered 2015-08-14: 3 g via INTRAVENOUS
  Filled 2015-08-11: qty 3000

## 2015-08-11 NOTE — Progress Notes (Signed)
I spoke with Mr Delaughter and confirmed new arrival time of 11:30 on Monday.

## 2015-08-14 ENCOUNTER — Encounter (HOSPITAL_COMMUNITY): Payer: Self-pay | Admitting: General Practice

## 2015-08-14 ENCOUNTER — Inpatient Hospital Stay (HOSPITAL_COMMUNITY): Payer: BLUE CROSS/BLUE SHIELD | Admitting: Anesthesiology

## 2015-08-14 ENCOUNTER — Inpatient Hospital Stay (HOSPITAL_COMMUNITY): Payer: BLUE CROSS/BLUE SHIELD

## 2015-08-14 ENCOUNTER — Inpatient Hospital Stay (HOSPITAL_COMMUNITY)
Admission: RE | Admit: 2015-08-14 | Discharge: 2015-08-16 | DRG: 470 | Disposition: A | Discharge status: Home Health | Payer: BLUE CROSS/BLUE SHIELD | Source: Ambulatory Visit | Attending: Orthopedic Surgery | Admitting: Orthopedic Surgery

## 2015-08-14 ENCOUNTER — Telehealth: Payer: Self-pay | Admitting: Family Medicine

## 2015-08-14 ENCOUNTER — Encounter (HOSPITAL_COMMUNITY)
Admission: RE | Disposition: A | Discharge status: Home Health | Payer: Self-pay | Source: Ambulatory Visit | Attending: Orthopedic Surgery

## 2015-08-14 DIAGNOSIS — I10 Essential (primary) hypertension: Secondary | ICD-10-CM | POA: Diagnosis present

## 2015-08-14 DIAGNOSIS — D62 Acute posthemorrhagic anemia: Secondary | ICD-10-CM | POA: Diagnosis not present

## 2015-08-14 DIAGNOSIS — M1611 Unilateral primary osteoarthritis, right hip: Principal | ICD-10-CM | POA: Diagnosis present

## 2015-08-14 DIAGNOSIS — Z8261 Family history of arthritis: Secondary | ICD-10-CM

## 2015-08-14 DIAGNOSIS — Z96652 Presence of left artificial knee joint: Secondary | ICD-10-CM | POA: Diagnosis present

## 2015-08-14 DIAGNOSIS — M25551 Pain in right hip: Secondary | ICD-10-CM | POA: Diagnosis present

## 2015-08-14 DIAGNOSIS — M109 Gout, unspecified: Secondary | ICD-10-CM | POA: Diagnosis present

## 2015-08-14 DIAGNOSIS — Z91048 Other nonmedicinal substance allergy status: Secondary | ICD-10-CM | POA: Diagnosis not present

## 2015-08-14 DIAGNOSIS — Z6841 Body Mass Index (BMI) 40.0 and over, adult: Secondary | ICD-10-CM

## 2015-08-14 DIAGNOSIS — Z419 Encounter for procedure for purposes other than remedying health state, unspecified: Secondary | ICD-10-CM

## 2015-08-14 DIAGNOSIS — Z79899 Other long term (current) drug therapy: Secondary | ICD-10-CM

## 2015-08-14 HISTORY — PX: TOTAL HIP ARTHROPLASTY: SHX124

## 2015-08-14 SURGERY — ARTHROPLASTY, HIP, TOTAL, ANTERIOR APPROACH
Anesthesia: General | Site: Hip | Laterality: Right

## 2015-08-14 MED ORDER — DEXAMETHASONE SODIUM PHOSPHATE 10 MG/ML IJ SOLN
10.0000 mg | Freq: Two times a day (BID) | INTRAMUSCULAR | Status: AC
  Administered 2015-08-14 – 2015-08-15 (×2): 10 mg via INTRAVENOUS
  Filled 2015-08-14 (×2): qty 1

## 2015-08-14 MED ORDER — HYDROMORPHONE HCL 1 MG/ML IJ SOLN
INTRAMUSCULAR | Status: AC
  Administered 2015-08-14: 0.5 mg via INTRAVENOUS
  Filled 2015-08-14: qty 1

## 2015-08-14 MED ORDER — SUGAMMADEX SODIUM 500 MG/5ML IV SOLN
INTRAVENOUS | Status: AC
  Filled 2015-08-14: qty 5

## 2015-08-14 MED ORDER — ESMOLOL HCL 100 MG/10ML IV SOLN
INTRAVENOUS | Status: DC | PRN
  Administered 2015-08-14: 20 mg via INTRAVENOUS
  Administered 2015-08-14: 30 mg via INTRAVENOUS

## 2015-08-14 MED ORDER — ZOLPIDEM TARTRATE 5 MG PO TABS
5.0000 mg | ORAL_TABLET | Freq: Every evening | ORAL | Status: DC | PRN
  Administered 2015-08-16: 5 mg via ORAL
  Filled 2015-08-14: qty 1

## 2015-08-14 MED ORDER — HYDROMORPHONE HCL 1 MG/ML IJ SOLN
0.2500 mg | INTRAMUSCULAR | Status: DC | PRN
  Administered 2015-08-14 (×4): 0.5 mg via INTRAVENOUS

## 2015-08-14 MED ORDER — SUCCINYLCHOLINE CHLORIDE 20 MG/ML IJ SOLN
INTRAMUSCULAR | Status: DC | PRN
  Administered 2015-08-14: 200 mg via INTRAVENOUS

## 2015-08-14 MED ORDER — OXYCODONE-ACETAMINOPHEN 5-325 MG PO TABS: 1.0000 | ORAL_TABLET | ORAL | Status: DC | PRN

## 2015-08-14 MED ORDER — DEXTROSE 5 % IV SOLN
500.0000 mg | Freq: Four times a day (QID) | INTRAVENOUS | Status: DC | PRN
  Filled 2015-08-14: qty 5

## 2015-08-14 MED ORDER — OXYCODONE HCL 5 MG/5ML PO SOLN: 5.0000 mg | Freq: Once | ORAL | Status: DC | PRN

## 2015-08-14 MED ORDER — 0.9 % SODIUM CHLORIDE (POUR BTL) OPTIME
TOPICAL | Status: DC | PRN
  Administered 2015-08-14: 1000 mL

## 2015-08-14 MED ORDER — FENTANYL CITRATE (PF) 250 MCG/5ML IJ SOLN
INTRAMUSCULAR | Status: AC
  Filled 2015-08-14: qty 5

## 2015-08-14 MED ORDER — BUPIVACAINE LIPOSOME 1.3 % IJ SUSP
20.0000 mL | INTRAMUSCULAR | Status: AC
  Administered 2015-08-14: 20 mL
  Filled 2015-08-14: qty 20

## 2015-08-14 MED ORDER — OXYCODONE HCL 5 MG PO TABS: 5.0000 mg | ORAL_TABLET | Freq: Once | ORAL | Status: DC | PRN

## 2015-08-14 MED ORDER — BISACODYL 5 MG PO TBEC: 5.0000 mg | DELAYED_RELEASE_TABLET | Freq: Every day | ORAL | Status: DC | PRN

## 2015-08-14 MED ORDER — STERILE WATER FOR INJECTION IJ SOLN
INTRAMUSCULAR | Status: AC
  Filled 2015-08-14: qty 10

## 2015-08-14 MED ORDER — ONDANSETRON HCL 4 MG/2ML IJ SOLN
INTRAMUSCULAR | Status: DC | PRN
  Administered 2015-08-14: 4 mg via INTRAVENOUS

## 2015-08-14 MED ORDER — VECURONIUM BROMIDE 10 MG IV SOLR
INTRAVENOUS | Status: AC
  Filled 2015-08-14: qty 10

## 2015-08-14 MED ORDER — FENTANYL CITRATE (PF) 100 MCG/2ML IJ SOLN
INTRAMUSCULAR | Status: DC | PRN
  Administered 2015-08-14 (×2): 50 ug via INTRAVENOUS
  Administered 2015-08-14: 75 ug via INTRAVENOUS
  Administered 2015-08-14 (×6): 50 ug via INTRAVENOUS
  Administered 2015-08-14: 25 ug via INTRAVENOUS
  Administered 2015-08-14: 150 ug via INTRAVENOUS

## 2015-08-14 MED ORDER — OXYCODONE HCL 5 MG PO TABS
ORAL_TABLET | ORAL | Status: AC
  Administered 2015-08-15: 10 mg
  Filled 2015-08-14: qty 2

## 2015-08-14 MED ORDER — LOSARTAN POTASSIUM 50 MG PO TABS
100.0000 mg | ORAL_TABLET | Freq: Every day | ORAL | Status: DC
  Administered 2015-08-14 – 2015-08-16 (×3): 100 mg via ORAL
  Filled 2015-08-14 (×3): qty 2

## 2015-08-14 MED ORDER — ACETAMINOPHEN 325 MG PO TABS: 650.0000 mg | ORAL_TABLET | Freq: Four times a day (QID) | ORAL | Status: DC | PRN

## 2015-08-14 MED ORDER — FENTANYL CITRATE (PF) 100 MCG/2ML IJ SOLN: 25.0000 ug | INTRAMUSCULAR | Status: DC | PRN

## 2015-08-14 MED ORDER — ONDANSETRON HCL 4 MG PO TABS: 4.0000 mg | ORAL_TABLET | Freq: Four times a day (QID) | ORAL | Status: DC | PRN

## 2015-08-14 MED ORDER — LIDOCAINE HCL (CARDIAC) 20 MG/ML IV SOLN
INTRAVENOUS | Status: AC
  Filled 2015-08-14: qty 5

## 2015-08-14 MED ORDER — ALBUMIN HUMAN 5 % IV SOLN
INTRAVENOUS | Status: DC | PRN
  Administered 2015-08-14 (×4): via INTRAVENOUS

## 2015-08-14 MED ORDER — MIDAZOLAM HCL 2 MG/2ML IJ SOLN
INTRAMUSCULAR | Status: AC
  Filled 2015-08-14: qty 2

## 2015-08-14 MED ORDER — KETOROLAC TROMETHAMINE 15 MG/ML IJ SOLN
INTRAMUSCULAR | Status: AC
  Filled 2015-08-14: qty 1

## 2015-08-14 MED ORDER — ASPIRIN EC 325 MG PO TBEC: 325.0000 mg | DELAYED_RELEASE_TABLET | Freq: Two times a day (BID) | ORAL | Status: DC

## 2015-08-14 MED ORDER — CEFAZOLIN SODIUM-DEXTROSE 2-3 GM-% IV SOLR
2.0000 g | Freq: Four times a day (QID) | INTRAVENOUS | Status: AC
Start: 2015-08-14 — End: 2015-08-15
  Administered 2015-08-14 – 2015-08-15 (×2): 2 g via INTRAVENOUS
  Filled 2015-08-14 (×4): qty 50

## 2015-08-14 MED ORDER — TRANEXAMIC ACID 1000 MG/10ML IV SOLN
1000.0000 mg | INTRAVENOUS | Status: AC
  Administered 2015-08-14: 1000 mg via INTRAVENOUS
  Filled 2015-08-14: qty 10

## 2015-08-14 MED ORDER — METHOCARBAMOL 500 MG PO TABS
500.0000 mg | ORAL_TABLET | Freq: Four times a day (QID) | ORAL | Status: DC | PRN
  Administered 2015-08-14 – 2015-08-16 (×5): 500 mg via ORAL
  Filled 2015-08-14 (×4): qty 1

## 2015-08-14 MED ORDER — DIPHENHYDRAMINE HCL 12.5 MG/5ML PO ELIX: 12.5000 mg | ORAL_SOLUTION | ORAL | Status: DC | PRN

## 2015-08-14 MED ORDER — LACTATED RINGERS IV SOLN
INTRAVENOUS | Status: DC
Start: 2015-08-14 — End: 2015-08-14
  Administered 2015-08-14 (×2): via INTRAVENOUS

## 2015-08-14 MED ORDER — POLYETHYLENE GLYCOL 3350 17 G PO PACK: 17.0000 g | PACK | Freq: Every day | ORAL | Status: DC | PRN

## 2015-08-14 MED ORDER — ONDANSETRON HCL 4 MG/2ML IJ SOLN: 4.0000 mg | Freq: Four times a day (QID) | INTRAMUSCULAR | Status: DC | PRN

## 2015-08-14 MED ORDER — VECURONIUM BROMIDE 10 MG IV SOLR
INTRAVENOUS | Status: DC | PRN
  Administered 2015-08-14 (×2): 2 mg via INTRAVENOUS
  Administered 2015-08-14: 8 mg via INTRAVENOUS
  Administered 2015-08-14: 4 mg via INTRAVENOUS
  Administered 2015-08-14: 3 mg via INTRAVENOUS

## 2015-08-14 MED ORDER — KETOROLAC TROMETHAMINE 15 MG/ML IJ SOLN
15.0000 mg | Freq: Four times a day (QID) | INTRAMUSCULAR | Status: AC
  Administered 2015-08-14 – 2015-08-15 (×4): 15 mg via INTRAVENOUS
  Filled 2015-08-14 (×3): qty 1

## 2015-08-14 MED ORDER — METHOCARBAMOL 750 MG PO TABS: 750.0000 mg | ORAL_TABLET | Freq: Three times a day (TID) | ORAL | Status: DC | PRN

## 2015-08-14 MED ORDER — METHOCARBAMOL 500 MG PO TABS
ORAL_TABLET | ORAL | Status: AC
  Filled 2015-08-14: qty 1

## 2015-08-14 MED ORDER — DOCUSATE SODIUM 100 MG PO CAPS
100.0000 mg | ORAL_CAPSULE | Freq: Two times a day (BID) | ORAL | Status: DC
  Administered 2015-08-14 – 2015-08-16 (×3): 100 mg via ORAL
  Filled 2015-08-14 (×4): qty 1

## 2015-08-14 MED ORDER — ONDANSETRON HCL 4 MG/2ML IJ SOLN: 4.0000 mg | Freq: Once | INTRAMUSCULAR | Status: DC | PRN

## 2015-08-14 MED ORDER — LIDOCAINE HCL (CARDIAC) 20 MG/ML IV SOLN
INTRAVENOUS | Status: DC | PRN
  Administered 2015-08-14: 45 mg via INTRAVENOUS

## 2015-08-14 MED ORDER — OXYCODONE HCL 5 MG PO TABS
5.0000 mg | ORAL_TABLET | ORAL | Status: DC | PRN
  Administered 2015-08-14 (×2): 10 mg via ORAL
  Administered 2015-08-15: 5 mg via ORAL
  Administered 2015-08-15 (×3): 10 mg via ORAL
  Administered 2015-08-15: 5 mg via ORAL
  Administered 2015-08-15 – 2015-08-16 (×6): 10 mg via ORAL
  Filled 2015-08-14 (×11): qty 2

## 2015-08-14 MED ORDER — ALUM & MAG HYDROXIDE-SIMETH 200-200-20 MG/5ML PO SUSP: 30.0000 mL | ORAL | Status: DC | PRN

## 2015-08-14 MED ORDER — SODIUM CHLORIDE 0.9 % IV SOLN
10.0000 mg | INTRAVENOUS | Status: DC | PRN
  Administered 2015-08-14: 20 ug/min via INTRAVENOUS

## 2015-08-14 MED ORDER — PROPOFOL 10 MG/ML IV BOLUS
INTRAVENOUS | Status: DC | PRN
  Administered 2015-08-14: 200 mg via INTRAVENOUS

## 2015-08-14 MED ORDER — FENTANYL CITRATE (PF) 250 MCG/5ML IJ SOLN
INTRAMUSCULAR | Status: AC
Start: 2015-08-14 — End: 2015-08-14
  Filled 2015-08-14: qty 5

## 2015-08-14 MED ORDER — ACETAMINOPHEN 650 MG RE SUPP: 650.0000 mg | Freq: Four times a day (QID) | RECTAL | Status: DC | PRN

## 2015-08-14 MED ORDER — MIDAZOLAM HCL 5 MG/5ML IJ SOLN
INTRAMUSCULAR | Status: DC | PRN
  Administered 2015-08-14: 2 mg via INTRAVENOUS

## 2015-08-14 MED ORDER — HYDROMORPHONE HCL 1 MG/ML IJ SOLN: 1.0000 mg | INTRAMUSCULAR | Status: DC | PRN

## 2015-08-14 MED ORDER — SUGAMMADEX SODIUM 500 MG/5ML IV SOLN
INTRAVENOUS | Status: DC | PRN
  Administered 2015-08-14: 300 mg via INTRAVENOUS

## 2015-08-14 MED ORDER — BUPIVACAINE HCL 0.5 % IJ SOLN
INTRAMUSCULAR | Status: DC | PRN
  Administered 2015-08-14: 20 mL

## 2015-08-14 MED ORDER — BUPIVACAINE HCL (PF) 0.5 % IJ SOLN
INTRAMUSCULAR | Status: AC
  Filled 2015-08-14: qty 30

## 2015-08-14 MED ORDER — SODIUM CHLORIDE 0.9 % IV SOLN: INTRAVENOUS | Status: DC

## 2015-08-14 MED ORDER — ASPIRIN EC 325 MG PO TBEC
325.0000 mg | DELAYED_RELEASE_TABLET | Freq: Two times a day (BID) | ORAL | Status: DC
Start: 2015-08-14 — End: 2015-08-16
  Administered 2015-08-14 – 2015-08-16 (×4): 325 mg via ORAL
  Filled 2015-08-14 (×4): qty 1

## 2015-08-14 SURGICAL SUPPLY — 61 items
BENZOIN TINCTURE PRP APPL 2/3 (GAUZE/BANDAGES/DRESSINGS) ×3 IMPLANT
BLADE SAW SGTL 18X1.27X75 (BLADE) ×2 IMPLANT
BLADE SAW SGTL 18X1.27X75MM (BLADE) ×1
BLADE SURG ROTATE 9660 (MISCELLANEOUS) IMPLANT
BNDG COHESIVE 6X5 TAN STRL LF (GAUZE/BANDAGES/DRESSINGS) IMPLANT
BNDG GAUZE ELAST 4 BULKY (GAUZE/BANDAGES/DRESSINGS) IMPLANT
CAPT HIP TOTAL 2 ×3 IMPLANT
CELLS DAT CNTRL 66122 CELL SVR (MISCELLANEOUS) IMPLANT
CLOSURE STERI-STRIP 1/2X4 (GAUZE/BANDAGES/DRESSINGS)
CLSR STERI-STRIP ANTIMIC 1/2X4 (GAUZE/BANDAGES/DRESSINGS) IMPLANT
COVER PERINEAL POST (MISCELLANEOUS) ×3 IMPLANT
COVER SURGICAL LIGHT HANDLE (MISCELLANEOUS) ×3 IMPLANT
DRAPE C-ARM 42X72 X-RAY (DRAPES) ×3 IMPLANT
DRAPE STERI IOBAN 125X83 (DRAPES) ×3 IMPLANT
DRAPE U-SHAPE 47X51 STRL (DRAPES) ×6 IMPLANT
DRSG AQUACEL AG ADV 3.5X10 (GAUZE/BANDAGES/DRESSINGS) ×3 IMPLANT
DURAPREP 26ML APPLICATOR (WOUND CARE) ×3 IMPLANT
ELECT BLADE 4.0 EZ CLEAN MEGAD (MISCELLANEOUS)
ELECT CAUTERY BLADE 6.4 (BLADE) ×3 IMPLANT
ELECT REM PT RETURN 9FT ADLT (ELECTROSURGICAL) ×3
ELECTRODE BLDE 4.0 EZ CLN MEGD (MISCELLANEOUS) IMPLANT
ELECTRODE REM PT RTRN 9FT ADLT (ELECTROSURGICAL) ×1 IMPLANT
GAUZE XEROFORM 1X8 LF (GAUZE/BANDAGES/DRESSINGS) ×3 IMPLANT
GLOVE BIOGEL PI IND STRL 8 (GLOVE) ×2 IMPLANT
GLOVE BIOGEL PI INDICATOR 8 (GLOVE) ×4
GLOVE ECLIPSE 7.5 STRL STRAW (GLOVE) ×6 IMPLANT
GOWN STRL REUS W/ TWL LRG LVL3 (GOWN DISPOSABLE) ×2 IMPLANT
GOWN STRL REUS W/ TWL XL LVL3 (GOWN DISPOSABLE) ×2 IMPLANT
GOWN STRL REUS W/TWL LRG LVL3 (GOWN DISPOSABLE) ×4
GOWN STRL REUS W/TWL XL LVL3 (GOWN DISPOSABLE) ×4
HOOD PEEL AWAY FACE SHEILD DIS (HOOD) ×6 IMPLANT
KIT BASIN OR (CUSTOM PROCEDURE TRAY) ×3 IMPLANT
KIT ROOM TURNOVER OR (KITS) ×3 IMPLANT
MANIFOLD NEPTUNE II (INSTRUMENTS) ×3 IMPLANT
NEEDLE SPNL 22GX3.5 QUINCKE BK (NEEDLE) ×3 IMPLANT
NS IRRIG 1000ML POUR BTL (IV SOLUTION) ×3 IMPLANT
PACK TOTAL JOINT (CUSTOM PROCEDURE TRAY) ×3 IMPLANT
PACK UNIVERSAL I (CUSTOM PROCEDURE TRAY) ×3 IMPLANT
PAD ARMBOARD 7.5X6 YLW CONV (MISCELLANEOUS) ×6 IMPLANT
RETRACTOR YANK SUCT EIGR SABER (INSTRUMENTS) ×3 IMPLANT
RTRCTR WOUND ALEXIS 18CM MED (MISCELLANEOUS)
RTRCTR WOUND ALEXIS 18CM SML (INSTRUMENTS) ×3
SAVER CELL AAL HAEMONETICS (INSTRUMENTS) ×1 IMPLANT
SPONGE LAP 18X18 X RAY DECT (DISPOSABLE) IMPLANT
STAPLER VISISTAT 35W (STAPLE) IMPLANT
SUT ETHIBOND NAB CT1 #1 30IN (SUTURE) ×6 IMPLANT
SUT MNCRL AB 3-0 PS2 18 (SUTURE) IMPLANT
SUT VIC AB 0 CT1 27 (SUTURE) ×2
SUT VIC AB 0 CT1 27XBRD ANBCTR (SUTURE) ×1 IMPLANT
SUT VIC AB 1 CT1 27 (SUTURE) ×4
SUT VIC AB 1 CT1 27XBRD ANBCTR (SUTURE) ×2 IMPLANT
SUT VIC AB 2-0 CT1 27 (SUTURE) ×2
SUT VIC AB 2-0 CT1 TAPERPNT 27 (SUTURE) ×1 IMPLANT
SYR 50ML LL SCALE MARK (SYRINGE) ×3 IMPLANT
TOWEL OR 17X24 6PK STRL BLUE (TOWEL DISPOSABLE) ×3 IMPLANT
TOWEL OR 17X26 10 PK STRL BLUE (TOWEL DISPOSABLE) ×3 IMPLANT
TRAY CATH 16FR W/PLASTIC CATH (SET/KITS/TRAYS/PACK) ×3 IMPLANT
TRAY FOLEY CATH 16FR SILVER (SET/KITS/TRAYS/PACK) IMPLANT
TUBE CONNECTING 12'X1/4 (SUCTIONS) ×1
TUBE CONNECTING 12X1/4 (SUCTIONS) ×2 IMPLANT
WATER STERILE IRR 1000ML POUR (IV SOLUTION) IMPLANT

## 2015-08-14 NOTE — Anesthesia Procedure Notes (Signed)
Procedure Name: Intubation Date/Time: 08/14/2015 1:41 PM Performed by: Rush Farmer E Pre-anesthesia Checklist: Patient identified, Emergency Drugs available, Suction available, Patient being monitored and Timeout performed Patient Re-evaluated:Patient Re-evaluated prior to inductionOxygen Delivery Method: Circle system utilized Preoxygenation: Pre-oxygenation with 100% oxygen Intubation Type: IV induction and Rapid sequence Laryngoscope Size: Mac and 4 Grade View: Grade I Tube type: Oral Tube size: 7.5 mm Number of attempts: 1 Airway Equipment and Method: Stylet Placement Confirmation: ETT inserted through vocal cords under direct vision,  positive ETCO2 and breath sounds checked- equal and bilateral Secured at: 23 cm Tube secured with: Tape Dental Injury: Teeth and Oropharynx as per pre-operative assessment

## 2015-08-14 NOTE — Discharge Instructions (Signed)

## 2015-08-14 NOTE — Telephone Encounter (Signed)
Donald [name] from [facility] is having hip surgery today at [facility] hospital

## 2015-08-14 NOTE — Op Note (Signed)
NAME:  GARRELL, LAVERS NO.:  [id number]  MEDICAL RECORD NO.:  [id number]  LOCATION:  [id number]                        FACILITY:  [place]  PHYSICIAN:  Alta Corning, M.D.   DATE OF BIRTH:  Sep 14, 1955  DATE OF PROCEDURE:  08/14/2015 DATE OF DISCHARGE:                              OPERATIVE REPORT   PREOPERATIVE DIAGNOSIS:  End-stage degenerative joint disease, right hip.  POSTOPERATIVE DIAGNOSIS:  End-stage degenerative joint disease, right hip.  PROCEDURE: 1. Right total hip replacement with a Corail stem size 13, 58 mm     Pinnacle porous-coated cup fully porous-coated, +4 neutral hip     liner, 36 mm delta ceramic hip ball with a +5 offset. 2. Interpretation of multiple intraoperative fluoroscopic images.  SURGEON:  Alta Corning, M.D.  ASSISTANT:  Gary Fleet, P.A.  ANESTHESIA:  General.  BRIEF HISTORY:  Mr. Berrelleza is a 60 year old male with a long history of severe arthritis of his right hip.  He had an Achilles tendon rupture which was complicated by breakdown of the skin.  Postoperatively, he had issues with FiberWire stitch; and after additional surgery to remove FiberWire and open wound, he ultimately healed this and then once this was healed, he is brought to the operating room for fixing his hip.  So, he was on this hip for a prolonged period of time taking significant pain medication because of severe arthritis in the hip.  The patient was brought to the operating room for right total hip replacement.  PROCEDURE IN DETAIL:  The patient was brought to the operative room. After adequate level of anesthesia was obtained with general anesthetic, the patient was placed supine on the operating table.  He was then moved onto the Hana bed, and the right hip was prepped and draped in the usual sterile fashion.  An anterior approach to the hip was then undertaken, and an incision was made anteriorly to the subcutaneous tissue, down to the level of tensor  fascia.  Tensor fascia was identified, and an incision was made in the tensor fascia, and the muscles were retracted around the neck.  Retraction was taken around the neck.  The hip capsule was opened and tagged.  Retractors were replaced, and a provisional neck cut was made.  Attention was then turned towards externally rotating the femur, putting retractors in place; we got to the socket, sequentially reamed the socket to a level of 57 mm and put in a 58 mm porous-coated socket with 45 degrees of lateral opening and 30 degrees of anteversion. Once this was completed, the attention was turned towards the stem size where the arm from the Quentin bed was placed, and the patient was then placed in a down and over position with external rotation of the femur; and once this was done, the femur was opened and sequentially rasped to a level of 13 mm, and a 13 mm Corail stem was then placed and a +5 hip ball gave [place] the appropriate hip length.  The trial ball was then used to establish the hip length.  A delta ceramic hip ball +5 was opened and placed on the stem, and the hip was reduced.  Anatomic  reduction was achieved.  Intraoperative fluoroscopic images showed that the leg lengths were symmetric.  At this point, the wounds were irrigated, suctioned dry, closed in layers.  The anterior capsule was closed with interrupted Vicryl stitching.  The tensor fascia was closed with 1 Vicryl running.  The skin was closed with 0 Vicryl and 2-0 Vicryl and skin staples because he had had some reaction to the Vicryl stitching previously.  At this point, the patient was taken to the recovery and was noted to be in satisfactory condition.  Estimated blood loss for the procedure was 750 mL, but the actual record could be gotten from the anesthetic record.  Gary Fleet assisted throughout the case and was critical in his performance, and multiple intraoperative hip x-rays were taken and interpreted by  me.     Alta Corning, M.D.     Corliss Skains  D:  08/14/2015  T:  08/14/2015  Job:  [id number]

## 2015-08-14 NOTE — H&P (Signed)
TOTAL HIP ADMISSION H&P  Patient is admitted for right total hip arthroplasty.  Subjective:  Chief Complaint: right hip pain  HPI: Donald [name], 60 y.o. male, has a history of pain and functional disability in the right hip(s) due to arthritis and patient has failed non-surgical conservative treatments for greater than 12 weeks to include corticosteriod injections, supervised PT with diminished ADL's post treatment, use of assistive devices, weight reduction as appropriate and activity modification.  Onset of symptoms was gradual starting 2 years ago with gradually worsening course since that time.The patient noted no past surgery on the right hip(s).  Patient currently rates pain in the right hip at 10 out of 10 with activity. Patient has night pain, worsening of pain with activity and weight bearing, trendelenberg gait, pain that interfers with activities of daily living, pain with passive range of motion, crepitus and joint swelling. Patient has evidence of subchondral cysts, subchondral sclerosis, joint subluxation and joint space narrowing by imaging studies. This condition presents safety issues increasing the risk of falls. This patient has had failure of all reasonable conservative care.  There is no current active infection.  Patient Active Problem List   Diagnosis Date Noted  . Infection due to Enterobacteriaceae   . S/P Achilles tendon repair 04/12/2015  . Achilles tendinitis of both lower extremities 06/20/2014  . Leg length difference, acquired 06/20/2014  . Hematochezia 04/07/2013  . Osteoarthritis of left knee 01/08/2013  . Allergic rhinitis, seasonal 11/04/2012  . Gout, unspecified 05/11/2010  . INSOMNIA-SLEEP DISORDER-UNSPEC 01/09/2010  . Essential hypertension 12/14/2009   Past Medical History  Diagnosis Date  . Hypertension   . Bronchitis     HISTORY FEW MO AGO  . Arthritis   . Diverticulitis   . Colon polyps   . Hyperthyroidism   . Diverticulosis 04/07/2013  .  Colitis   . Gout     Past Surgical History  Procedure Laterality Date  . Total knee arthroplasty Left 01/08/2013    Procedure: TOTAL KNEE ARTHROPLASTY;  Surgeon: Alta Corning, MD;  Location: [place];  Service: Orthopedics;  Laterality: Left;  . Achilles tendon repair  08/2014    X 2  . Incision and drainage of wound Left 04/12/2015    Procedure: DEBRIDEMENT OF ACHILLES TENDON LEFT, REMOVAL OF FIBERWIRE SUTURE;  Surgeon: Dorna Leitz, MD;  Location: [place];  Service: Orthopedics;  Laterality: Left;  Marland Kitchen Minor application of wound vac  04/12/2015    Procedure:  APPLICATION OF INCISIONAL WOUND VAC;  Surgeon: Dorna Leitz, MD;  Location: [place];  Service: Orthopedics;;  . Colonoscopy w/ polypectomy      Prescriptions prior to admission  Medication Sig Dispense Refill Last Dose  . aspirin 81 MG tablet Take 81 mg by mouth daily.   Taking  . ibuprofen (ADVIL,MOTRIN) 200 MG tablet Take 400-600 mg by mouth 2 (two) times daily as needed for moderate pain.     Marland Kitchen losartan (COZAAR) 100 MG tablet TAKE 1 TABLET BY MOUTH EVERY DAY (Patient taking differently: TAKE 100 MG BY MOUTH EVERY DAY) 90 tablet 2 Taking  . oxyCODONE-acetaminophen (PERCOCET/ROXICET) 5-325 MG tablet Take 1-2 tablets by mouth every 6 (six) hours as needed for moderate pain or severe pain. 50 tablet 0 Not Taking  . traZODone (DESYREL) 50 MG tablet TAKE 1/2 - 1 TABLET BY MOUTH AT BEDTIME AS NEEDED FOR INSOMNIA 30 tablet 5 Not Taking  . allopurinol (ZYLOPRIM) 100 MG tablet Take 100 mg by mouth daily as needed (for gout  flare ups).   6    Allergies  Allergen Reactions  . Other Other (See Comments)    Kevlar stitching, infection   . Tape Rash    White strips of tape, infection    Social History  Substance Use Topics  . Smoking status: Never Smoker   . Smokeless tobacco: Never Used  . Alcohol Use: 6.0 - 7.2 oz/week    10-12 Cans of beer per week     Comment: 2-3 times a week    Family History  Problem Relation Age of Onset  . Emphysema  Mother   . COPD Mother   . Cancer Father   . Arthritis Brother      ROS ROS: I have reviewed the patient's review of systems thoroughly and there are no positive responses as relates to the HPI. Objective:  Physical Exam  Vital signs in last 24 hours:   Well-developed well-nourished patient in no acute distress. Alert and oriented x3 HEENT:within normal limits Cardiac: Regular rate and rhythm Pulmonary: Lungs clear to auscultation Abdomen: Soft and nontender.  Normal active bowel sounds  Musculoskeletal: Right hip: Painful range of motion.  Limitation of range of motion.  Neurovascular intact distally. Labs: Recent Results (from the past 2160 hour(s))  Basic metabolic panel     Status: None   Collection Time: 05/18/15 10:24 AM  Result Value Ref Range   Sodium 140 135 - 146 mmol/L   Potassium 5.0 3.5 - 5.3 mmol/L   Chloride 103 98 - 110 mmol/L   CO2 24 20 - 31 mmol/L   Glucose, Bld 91 65 - 99 mg/dL   BUN 14 7 - 25 mg/dL   Creat 0.91 0.70 - 1.33 mg/dL   Calcium 9.4 8.6 - 10.3 mg/dL  CBC with Differential/Platelet     Status: None   Collection Time: 05/18/15 10:24 AM  Result Value Ref Range   WBC 8.5 4.0 - 10.5 K/uL   RBC 4.75 4.22 - 5.81 MIL/uL   Hemoglobin 14.0 13.0 - 17.0 g/dL   HCT 41.9 39.0 - 52.0 %   MCV 88.2 78.0 - 100.0 fL   MCH 29.5 26.0 - 34.0 pg   MCHC 33.4 30.0 - 36.0 g/dL   RDW 13.6 11.5 - 15.5 %   Platelets 273 150 - 400 K/uL   MPV 9.1 8.6 - 12.4 fL   Neutrophils Relative % 53 43 - 77 %   Neutro Abs 4.5 1.7 - 7.7 K/uL   Lymphocytes Relative 35 12 - 46 %   Lymphs Abs 3.0 0.7 - 4.0 K/uL   Monocytes Relative 8 3 - 12 %   Monocytes Absolute 0.7 0.1 - 1.0 K/uL   Eosinophils Relative 4 0 - 5 %   Eosinophils Absolute 0.3 0.0 - 0.7 K/uL   Basophils Relative 0 0 - 1 %   Basophils Absolute 0.0 0.0 - 0.1 K/uL   Smear Review Criteria for review not met   C-reactive protein     Status: Abnormal   Collection Time: 05/18/15 10:24 AM  Result Value Ref Range    CRP 0.8 (H) <0.60 mg/dL  Sedimentation rate     Status: None   Collection Time: 05/18/15 10:24 AM  Result Value Ref Range   Sed Rate 15 0 - 20 mm/hr  Lipid panel     Status: None   Collection Time: 08/04/15  9:12 AM  Result Value Ref Range   Cholesterol 129 0 - 200 mg/dL    Comment: ATP III  Classification       Desirable:  < 200 mg/dL               Borderline High:  200 - 239 mg/dL          High:  > = 240 mg/dL   Triglycerides 115.0 0.0 - 149.0 mg/dL    Comment: Normal:  <150 mg/dLBorderline High:  150 - 199 mg/dL   HDL 40.80 >39.00 mg/dL   VLDL 23.0 0.0 - 40.0 mg/dL   LDL Cholesterol 65 0 - 99 mg/dL   Total CHOL/HDL Ratio 3     Comment:                Men          Women1/2 Average Risk     3.4          3.3Average Risk          5.0          4.42X Average Risk          9.6          7.13X Average Risk          15.0          11.0                       NonHDL 88.29     Comment: NOTE:  Non-HDL goal should be 30 mg/dL higher than patient's LDL goal (i.e. LDL goal of < 70 mg/dL, would have non-HDL goal of < 100 mg/dL)  HIV antibody     Status: None   Collection Time: 08/04/15  9:12 AM  Result Value Ref Range   HIV 1&2 Ab, 4th Generation NONREACTIVE NONREACTIVE    Comment:   HIV-1 antigen and HIV-1/HIV-2 antibodies were not detected.  There is no laboratory evidence of HIV infection.   HIV-1/2 Antibody Diff        Not indicated. HIV-1 RNA, Qual TMA          Not indicated.     PLEASE NOTE: This information has been disclosed to you from records whose confidentiality may be protected by state law. If your state requires such protection, then the state law prohibits you from making any further disclosure of the information without the specific written consent of the person to whom it pertains, or as otherwise permitted by law. A general authorization for the release of medical or other information is NOT sufficient for this purpose.   The performance of this assay has not been clinically  validated in patients less than 41 years old.   For additional information please refer to http://education.[employer].com/faq/FAQ106.  (This link is being provided for informational/educational purposes only.)     Hepatitis C antibody     Status: None   Collection Time: 08/04/15  9:12 AM  Result Value Ref Range   HCV Ab NEGATIVE NEGATIVE  Surgical pcr screen     Status: None   Collection Time: 08/04/15  1:55 PM  Result Value Ref Range   MRSA, PCR NEGATIVE NEGATIVE   Staphylococcus aureus NEGATIVE NEGATIVE    Comment:        The Xpert SA Assay (FDA approved for NASAL specimens in patients over 27 years of age), is one component of a comprehensive surveillance program.  Test performance has been validated by [facility] for patients greater than or equal to 100 year old. It is not intended to diagnose infection nor to guide  or monitor treatment.   Urinalysis, Routine w reflex microscopic (not at [facility])     Status: None   Collection Time: 08/04/15  1:55 PM  Result Value Ref Range   Color, Urine YELLOW YELLOW   APPearance CLEAR CLEAR   Specific Gravity, Urine 1.012 1.005 - 1.030   pH 5.5 5.0 - 8.0   Glucose, UA NEGATIVE NEGATIVE mg/dL   Hgb urine dipstick NEGATIVE NEGATIVE   Bilirubin Urine NEGATIVE NEGATIVE   Ketones, ur NEGATIVE NEGATIVE mg/dL   Protein, ur NEGATIVE NEGATIVE mg/dL   Nitrite NEGATIVE NEGATIVE   Leukocytes, UA NEGATIVE NEGATIVE    Comment: MICROSCOPIC NOT DONE ON URINES WITH NEGATIVE PROTEIN, BLOOD, LEUKOCYTES, NITRITE, OR GLUCOSE <1000 mg/dL.  APTT     Status: None   Collection Time: 08/04/15  1:56 PM  Result Value Ref Range   aPTT 29 24 - 37 seconds  CBC WITH DIFFERENTIAL     Status: None   Collection Time: 08/04/15  1:56 PM  Result Value Ref Range   WBC 8.6 4.0 - 10.5 K/uL   RBC 4.55 4.22 - 5.81 MIL/uL   Hemoglobin 14.0 13.0 - 17.0 g/dL   HCT 40.1 39.0 - 52.0 %   MCV 88.1 78.0 - 100.0 fL   MCH 30.8 26.0 - 34.0 pg   MCHC 34.9 30.0 - 36.0  g/dL   RDW 13.0 11.5 - 15.5 %   Platelets 339 150 - 400 K/uL   Neutrophils Relative % 50 %   Neutro Abs 4.3 1.7 - 7.7 K/uL   Lymphocytes Relative 42 %   Lymphs Abs 3.6 0.7 - 4.0 K/uL   Monocytes Relative 6 %   Monocytes Absolute 0.6 0.1 - 1.0 K/uL   Eosinophils Relative 1 %   Eosinophils Absolute 0.1 0.0 - 0.7 K/uL   Basophils Relative 1 %   Basophils Absolute 0.0 0.0 - 0.1 K/uL  Comprehensive metabolic panel     Status: None   Collection Time: 08/04/15  1:56 PM  Result Value Ref Range   Sodium 141 135 - 145 mmol/L   Potassium 4.2 3.5 - 5.1 mmol/L   Chloride 105 101 - 111 mmol/L   CO2 25 22 - 32 mmol/L   Glucose, Bld 95 65 - 99 mg/dL   BUN 12 6 - 20 mg/dL   Creatinine, Ser 1.00 0.61 - 1.24 mg/dL   Calcium 9.2 8.9 - 10.3 mg/dL   Total Protein 7.2 6.5 - 8.1 g/dL   Albumin 4.2 3.5 - 5.0 g/dL   AST 28 15 - 41 U/L   ALT 34 17 - 63 U/L   Alkaline Phosphatase 57 38 - 126 U/L   Total Bilirubin 0.3 0.3 - 1.2 mg/dL   GFR calc non Af Amer >60 >60 mL/min   GFR calc Af Amer >60 >60 mL/min    Comment: (NOTE) The eGFR has been calculated using the CKD EPI equation. This calculation has not been validated in all clinical situations. eGFR's persistently <60 mL/min signify possible Chronic Kidney Disease.    Anion gap 11 5 - 15  Protime-INR     Status: None   Collection Time: 08/04/15  1:56 PM  Result Value Ref Range   Prothrombin Time 14.7 11.6 - 15.2 seconds   INR 1.13 0.00 - 1.49    Estimated body mass index is 40.50 kg/(m^2) as calculated from the following:   Height as of 05/10/15: '6\' 3"'$  (1.905 m).   Weight as of 07/28/15: 146.965 kg (324 lb).  Imaging Review Plain radiographs demonstrate severe degenerative joint disease of the right hip(s). The bone quality appears to be good for age and reported activity level.  Assessment/Plan:  End stage arthritis, right hip(s)  The patient history, physical examination, clinical judgement of the provider and imaging studies are  consistent with end stage degenerative joint disease of the right hip(s) and total hip arthroplasty is deemed medically necessary. The treatment options including medical management, injection therapy, arthroscopy and arthroplasty were discussed at length. The risks and benefits of total hip arthroplasty were presented and reviewed. The risks due to aseptic loosening, infection, stiffness, dislocation/subluxation,  thromboembolic complications and other imponderables were discussed.  The patient acknowledged the explanation, agreed to proceed with the plan and consent was signed. Patient is being admitted for inpatient treatment for surgery, pain control, PT, OT, prophylactic antibiotics, VTE prophylaxis, progressive ambulation and ADL's and discharge planning.The patient is planning to be discharged home with home health services

## 2015-08-14 NOTE — Anesthesia Preprocedure Evaluation (Addendum)
Anesthesia Evaluation  Patient identified by MRN, date of birth, ID band Patient awake    Reviewed: Allergy & Precautions, NPO status , Patient's Chart, lab work & pertinent test results  Airway Mallampati: II  TM Distance: >3 FB Neck ROM: Full    Dental  (+) Teeth Intact, Dental Advisory Given   Pulmonary    breath sounds clear to auscultation       Cardiovascular hypertension,  Rhythm:Regular Rate:Normal     Neuro/Psych    GI/Hepatic   Endo/Other    Renal/GU      Musculoskeletal   Abdominal   Peds  Hematology   Anesthesia Other Findings   Reproductive/Obstetrics                             Anesthesia Physical Anesthesia Plan  ASA: III  Anesthesia Plan: General   Post-op Pain Management:    Induction: Intravenous  Airway Management Planned: Oral ETT  Additional Equipment:   Intra-op Plan:   Post-operative Plan: Extubation in OR  Informed Consent: I have reviewed the patients History and Physical, chart, labs and discussed the procedure including the risks, benefits and alternatives for the proposed anesthesia with the patient or authorized representative who has indicated his/her understanding and acceptance.   Dental advisory given  Plan Discussed with: CRNA and Anesthesiologist  Anesthesia Plan Comments:         Anesthesia Quick Evaluation  

## 2015-08-14 NOTE — Transfer of Care (Signed)
Immediate Anesthesia Transfer of Care Note  Patient: Donald [name]  Procedure(s) Performed: Procedure(s): TOTAL HIP ARTHROPLASTY ANTERIOR APPROACH (Right)  Patient Location: PACU  Anesthesia Type:General  Level of Consciousness: awake, alert , oriented and patient cooperative  Airway & Oxygen Therapy: Patient Spontanous Breathing and Patient connected to nasal cannula oxygen  Post-op Assessment: Report given to RN and Post -op Vital signs reviewed and stable  Post vital signs: Reviewed and stable  Last Vitals:  Filed Vitals:   08/14/15 1145  BP: 143/52  Pulse: 73  Temp: 36.7 C  Resp: 20    Complications: No apparent anesthesia complications

## 2015-08-14 NOTE — Brief Op Note (Signed)
08/14/2015  5:00 PM  PATIENT:  Donald [name]  60 y.o. male  PRE-OPERATIVE DIAGNOSIS:  osteoarthritis right hip  POST-OPERATIVE DIAGNOSIS:  osteoarthritis right hip  PROCEDURE:  Procedure(s): TOTAL HIP ARTHROPLASTY ANTERIOR APPROACH (Right)  SURGEON:  Surgeon(s) and Role:    * Dorna Leitz, MD - Primary  PHYSICIAN ASSISTANT:   ASSISTANTS: bethune   ANESTHESIA:   general  EBL:  Total I/O In: 2000 [I.V.:1000; IV Piggyback:1000] Out: 1500 [Blood:1500]  BLOOD ADMINISTERED:none  DRAINS: none   LOCAL MEDICATIONS USED:  OTHER experel  SPECIMEN:  No Specimen  DISPOSITION OF SPECIMEN:  N/A  COUNTS:  YES  TOURNIQUET:  * No tourniquets in log *  DICTATION: .Other Dictation: Dictation Number [id number]  PLAN OF CARE: Admit to inpatient   PATIENT DISPOSITION:  PACU - hemodynamically stable.   Delay start of Pharmacological VTE agent (>24hrs) due to surgical blood loss or risk of bleeding: no

## 2015-08-15 ENCOUNTER — Encounter (HOSPITAL_COMMUNITY): Payer: Self-pay | Admitting: Orthopedic Surgery

## 2015-08-15 ENCOUNTER — Telehealth: Payer: Self-pay | Admitting: Family Medicine

## 2015-08-15 LAB — BASIC METABOLIC PANEL
ANION GAP: 15 (ref 5–15)
BUN: 12 mg/dL (ref 6–20)
CALCIUM: 8.9 mg/dL (ref 8.9–10.3)
CO2: 18 mmol/L — AB (ref 22–32)
CREATININE: 1.14 mg/dL (ref 0.61–1.24)
Chloride: 106 mmol/L (ref 101–111)
GFR calc non Af Amer: >60 mL/min (ref >60)
GLUCOSE: 196 mg/dL — AB (ref 65–99)
POTASSIUM: 4 mmol/L (ref 3.5–5.1)
Sodium: 139 mmol/L (ref 135–145)

## 2015-08-15 LAB — POCT I-STAT 4, (NA,K, GLUC, HGB,HCT)
GLUCOSE: 138 mg/dL — AB (ref 65–99)
HCT: 38 % — ABNORMAL LOW (ref 39.0–52.0)
Hemoglobin: 12.9 g/dL — ABNORMAL LOW (ref 13.0–17.0)
Potassium: 4.1 mmol/L (ref 3.5–5.1)
Sodium: 140 mmol/L (ref 135–145)

## 2015-08-15 LAB — CBC
HEMATOCRIT: 32 % — AB (ref 39.0–52.0)
Hemoglobin: 10.6 g/dL — ABNORMAL LOW (ref 13.0–17.0)
MCH: 29 pg (ref 26.0–34.0)
MCHC: 33.1 g/dL (ref 30.0–36.0)
MCV: 87.7 fL (ref 78.0–100.0)
PLATELETS: 257 10*3/uL (ref 150–400)
RBC: 3.65 MIL/uL — AB (ref 4.22–5.81)
RDW: 13.4 % (ref 11.5–15.5)
WBC: 11.1 10*3/uL — AB (ref 4.0–10.5)

## 2015-08-15 NOTE — Evaluation (Signed)
Physical Therapy Evaluation Patient Details Name: Donald [name] MRN: [id number] DOB: Apr 24, 1956 Today's Date: 08/15/2015   History of Present Illness  pt admitted for R THA. PMHx: Achilles tendon repair, Left TKA, HTN, gout  Clinical Impression  Pt familiar from prior admissions with excellent mobility this AM able to walk in hall and perform stairs. Pt educated for transfers, gait, stairs and HEP with handout provided. Pt with decreased strength, transfers and gait who will benefit from acute therapy to maximize mobility, function and independence.    Follow Up Recommendations Home health PT    Equipment Recommendations  None recommended by PT    Recommendations for Other Services       Precautions / Restrictions Precautions Precautions: None Restrictions Weight Bearing Restrictions: Yes RLE Weight Bearing: Weight bearing as tolerated      Mobility  Bed Mobility Overal bed mobility: Modified Independent                Transfers Overall transfer level: Modified independent                  Ambulation/Gait Ambulation/Gait assistance: Supervision Ambulation Distance (Feet): 250 Feet Assistive device: Rolling walker (2 wheeled) Gait Pattern/deviations: Step-through pattern;Decreased stride length   Gait velocity interpretation: Below normal speed for age/gender General Gait Details: cues to relax shoulders and increase weight bearing bil LE  Stairs Stairs: Yes Stairs assistance: Supervision Stair Management: Step to pattern;Forwards;One rail Left Number of Stairs: 11 General stair comments: cues for sequence with pt able to demonstrate  Wheelchair Mobility    Modified Rankin (Stroke Patients Only)       Balance                                             Pertinent Vitals/Pain Pain Assessment: 0-10 Pain Score: 2  Pain Location: right hip Pain Descriptors / Indicators: Aching Pain Intervention(s): Limited activity within  patient's tolerance;Monitored during session;Repositioned    Home Living Family/patient expects to be discharged to:: Private residence Living Arrangements: Alone Available Help at Discharge: Family;Available 24 hours/day Type of Home: House Home Access: Stairs to enter Entrance Stairs-Rails: Left Entrance Stairs-Number of Steps: 2 Home Layout: Two level Home Equipment: Walker - 2 wheels;Cane - single point;Crutches;Bedside commode;Shower seat Additional Comments: pt will go to brother's house with walk in shower, single story and 2 stairs on initial DC    Prior Function Level of Independence: Independent               Hand Dominance        Extremity/Trunk Assessment   Upper Extremity Assessment: Overall WFL for tasks assessed           Lower Extremity Assessment: RLE deficits/detail RLE Deficits / Details: decreased strength and ROm as expected post op    Cervical / Trunk Assessment: Normal  Communication   Communication: No difficulties  Cognition Arousal/Alertness: Awake/alert Behavior During Therapy: WFL for tasks assessed/performed Overall Cognitive Status: Within Functional Limits for tasks assessed                      General Comments      Exercises Total Joint Exercises Heel Slides: AROM;Right;10 reps;Supine Hip ABduction/ADduction: AROM;Right;10 reps;Supine      Assessment/Plan    PT Assessment Patient needs continued PT services  PT Diagnosis Difficulty walking;Acute pain   PT Problem  List Decreased strength;Decreased activity tolerance;Decreased mobility;Pain  PT Treatment Interventions Gait training;DME instruction;Functional mobility training;Therapeutic activities;Therapeutic exercise;Patient/family education   PT Goals (Current goals can be found in the Care Plan section) Acute Rehab PT Goals Patient Stated Goal: walk without pain PT Goal Formulation: With patient Time For Goal Achievement: 08/22/15 Potential to Achieve  Goals: Good    Frequency 7X/week   Barriers to discharge        Co-evaluation               End of Session Equipment Utilized During Treatment: Gait belt Activity Tolerance: Patient tolerated treatment well Patient left: in chair;with call bell/phone within reach Nurse Communication: Mobility status         Time: [id number] PT Time Calculation (min) (ACUTE ONLY): 30 min   Charges:   PT Evaluation $PT Eval Moderate Complexity: 1 Procedure PT Treatments $Gait Training: 8-22 mins   PT G CodesMelford Aase 08/15/2015, 8:20 AM Elwyn Reach, [place]

## 2015-08-15 NOTE — Progress Notes (Signed)
Occupational Therapy Evaluation/Discharge Patient Details Name: Donald [name] MRN: [id number] DOB: 1956/03/24 Today's Date: 08/15/2015    History of Present Illness pt admitted for R THA. PMHx: Achilles tendon repair, Left TKA, HTN, gout   Clinical Impression   Pt completed all functional transfers and ADLs at supervision level. Educated pt on compensatory strategies for ADLs, energy conservation, and fall prevention strategies. Reviewed with pt use of ice for pain/edema management and to gradually increase activity level despite feeling great and having very little pain. All education has been completed and pt has no further questions. Pt with no further acute OT needs. OT signing off.    Follow Up Recommendations  No OT follow up;Supervision - Intermittent    Equipment Recommendations  None recommended by OT    Recommendations for Other Services       Precautions / Restrictions Precautions Precautions: None Restrictions Weight Bearing Restrictions: Yes RLE Weight Bearing: Weight bearing as tolerated      Mobility Bed Mobility Overal bed mobility: Modified Independent             General bed mobility comments: Pt up in chair on OT arrival  Transfers Overall transfer level: Modified independent Equipment used: Rolling walker (2 wheeled) Transfers: Sit to/from Stand Sit to Stand: Supervision         General transfer comment: Supervision for Verbal cues for safe hand placement and RW use.     Balance Overall balance assessment: Needs assistance Sitting-balance support: No upper extremity supported;Feet supported Sitting balance-Leahy Scale: Good     Standing balance support: No upper extremity supported;During functional activity Standing balance-Leahy Scale: Fair Standing balance comment: Able to maintain static balance without UE support                            ADL Overall ADL's : Needs assistance/impaired     Grooming: Wash/dry  hands;Supervision/safety;Standing       Lower Body Bathing: Supervison/ safety;Sit to/from stand;Sitting/lateral leans;Cueing for compensatory techniques Lower Body Bathing Details (indicate cue type and reason): Able to reach bilateral feet. Educated pt on availability of long-handled sponge. Upper Body Dressing : Supervision/safety;Sitting   Lower Body Dressing: Supervision/safety;Cueing for compensatory techniques;Sit to/from stand;Sitting/lateral leans Lower Body Dressing Details (indicate cue type and reason): Able to reach bilateral feet - cues to dress R leg first and undress it last Toilet Transfer: Supervision/safety;Cueing for safety;Ambulation;BSC;RW Toilet Transfer Details (indicate cue type and reason): BSC over toilet, cues to feel BSC on back of legs before reaching back to sit down Toileting- Clothing Manipulation and Hygiene: Supervision/safety;Sit to/from stand   Tub/ Shower Transfer: Tub transfer;Supervision/safety;Cueing for sequencing;Ambulation;Rolling walker Tub/Shower Transfer Details (indicate cue type and reason): Cues for proper step sequence with RW Functional mobility during ADLs: Supervision/safety;Rolling walker General ADL Comments: Educated pt on compensatory strategies for ADLs, fall prevention and energy conservation strategies. Also encouraged pt to use ice for pain/edema management and to gradually increase activity level despite feeling so good and having very little pain.     Vision Vision Assessment?: No apparent visual deficits   Perception     Praxis      Pertinent Vitals/Pain Pain Assessment: 0-10 Pain Score: 2  Pain Location: R hip incision Pain Descriptors / Indicators: Sore Pain Intervention(s): Limited activity within patient's tolerance;Monitored during session;Repositioned;Ice applied     Hand Dominance Right   Extremity/Trunk Assessment Upper Extremity Assessment Upper Extremity Assessment: Overall WFL for tasks assessed    Lower  Extremity Assessment Lower Extremity Assessment: RLE deficits/detail RLE Deficits / Details: decreased strength and ROm as expected post op   Cervical / Trunk Assessment Cervical / Trunk Assessment: Normal   Communication Communication Communication: No difficulties   Cognition Arousal/Alertness: Awake/alert Behavior During Therapy: WFL for tasks assessed/performed Overall Cognitive Status: Within Functional Limits for tasks assessed                     General Comments       Exercises       Shoulder Instructions      Home Living Family/patient expects to be discharged to:: Private residence Living Arrangements: Alone Available Help at Discharge: Family;Available 24 hours/day Type of Home: House Home Access: Stairs to enter [employer] of Steps: 2 Entrance Stairs-Rails: Left Home Layout: Two level     Bathroom Shower/Tub: Tub/shower unit;Curtain Shower/tub characteristics: Architectural technologist: Standard Bathroom Accessibility: Yes How Accessible: Accessible via walker Home Equipment: [place] - 2 wheels;Cane - single point;Bedside commode;Shower seat;Crutches   Additional Comments: pt will go to brother's house with walk in shower, single story and 2 stairs on initial DC      Prior Functioning/Environment Level of Independence: Independent        Comments: Works as a Presenter, broadcasting for [employer]    OT Diagnosis: Acute pain   OT Problem List: Decreased strength;Decreased range of motion;Decreased activity tolerance;Impaired balance (sitting and/or standing);Decreased coordination;Decreased safety awareness;Decreased knowledge of use of DME or AE;Decreased knowledge of precautions;Pain;Obesity   OT Treatment/Interventions:      OT Goals(Current goals can be found in the care plan section) Acute Rehab OT Goals Patient Stated Goal: walk without pain OT Goal Formulation: With patient Time For Goal Achievement:  08/22/15 Potential to Achieve Goals: Good  OT Frequency:     Barriers to D/C:            Co-evaluation              End of Session Equipment Utilized During Treatment: Gait belt;Rolling walker Nurse Communication: Mobility status  Activity Tolerance: Patient tolerated treatment well Patient left: in chair;with call bell/phone within reach   Time: 1100-1123 OT Time Calculation (min): 23 min Charges:  OT General Charges $OT Visit: 1 Procedure OT Evaluation $OT Eval Low Complexity: 1 Procedure OT Treatments $Self Care/Home Management : 8-22 mins G-Codes:    Redmond Baseman , OTR/L Pager: [phone]  08/15/2015, 11:34 AM

## 2015-08-15 NOTE — Care Management Note (Signed)
Case Management Note  Patient Details  Name: ZAKIAH HADE MRN: [id number] Date of Birth: 08-15-1955  Subjective/Objective:          S/p right total hip arthroplasty          Action/Plan: Spoke with patient about discharge plan, he selected [facility] which he worked with in the past. Patient will be staying with his brother at [employer] in [place]. He has a rolling walker and 3N1 from previous surgery.Contacted Susan at [facility] and set up [place]. [place] new address. No other discharge needs identified.   Expected Discharge Date:                  Expected Discharge Plan:  [place]  In-House Referral:  NA  Discharge planning Services  CM Consult  Post Acute Care Choice:  Home Health Choice offered to:  Patient  DME Arranged:  N/A DME Agency:  NA  HH Arranged:  PT [place] Agency:  [place]  Status of Service:  Completed, signed off  Medicare Important Message Given:    Date Medicare IM Given:    Medicare IM give by:    Date Additional Medicare IM Given:    Additional Medicare Important Message give by:     If discussed at [place] of Stay Meetings, dates discussed:    Additional Comments:  Nila Nephew, RN 08/15/2015, 10:02 AM

## 2015-08-15 NOTE — Progress Notes (Signed)
Utilization review completed.  

## 2015-08-15 NOTE — Telephone Encounter (Signed)
Samul Dada is calling to let md know pt decline to enroll in case management program for education and support.

## 2015-08-15 NOTE — Progress Notes (Signed)
Physical Therapy Treatment Patient Details Name: Donald Donald [name] MRN: [id number] DOB: 10-24-1955 Today's Date: 09/05/2015    History of Present Illness pt admitted for R THA. PMHx: Achilles tendon repair, Left TKA, HTN, gout    PT Comments    Pt continues with excellent mobility, gait and HEP. Pt educated for HEP and encouraged continued mobility with nursing.   Follow Up Recommendations  Home health PT     Equipment Recommendations  None recommended by PT    Recommendations for Other Services       Precautions / Restrictions Precautions Precautions: None Restrictions Weight Bearing Restrictions: Yes RLE Weight Bearing: Weight bearing as tolerated    Mobility  Bed Mobility Overal bed mobility: Modified Independent               Transfers Overall transfer level: Modified independent             Ambulation/Gait Ambulation/Gait assistance: Modified independent (Device/Increase time) Ambulation Distance (Feet): 500 Feet Assistive device: Rolling walker (2 wheeled) Gait Pattern/deviations: Step-through pattern;Decreased stride length   Gait velocity interpretation: Below normal speed for age/gender     Wheelchair Mobility    Modified Rankin (Stroke Patients Only)       Balance                    Cognition Arousal/Alertness: Awake/alert Behavior During Therapy: WFL for tasks assessed/performed Overall Cognitive Status: Within Functional Limits for tasks assessed                      Exercises Total Joint Exercises  Hip ABduction/ADduction: AROM;Standing;Right;15 reps Knee Flexion: AROM;Standing;Right;15 reps Marching in Standing: AROM;Standing;Right;15 reps Standing Hip Extension: AROM;Standing;Right;15 reps    General Comments        Pertinent Vitals/Pain Pain Assessment: 0-10 Pain Score: 2  Pain Location: R hip incision Pain Descriptors / Indicators: Sore Pain Intervention(s): Limited activity within patient's  tolerance;Monitored during session;Repositioned;Ice applied       Prior Function Level of Independence: Independent      Comments: Works as a Presenter, broadcasting for [employer]   PT Goals (current goals can now be found in the care plan section) Acute Rehab PT Goals Patient Stated Goal: walk without pain PT Goal Formulation: With patient Time For Goal Achievement: 08/22/15 Potential to Achieve Goals: Good Progress towards PT goals: Progressing toward goals    Frequency  7X/week    PT Plan Current plan remains appropriate    Co-evaluation             End of Session Equipment Utilized During Treatment: Gait belt Activity Tolerance: Patient tolerated treatment well Patient left: in bed;with call bell/phone within Donald [name]     Time: 1119-1132 PT Time Calculation (min) (ACUTE ONLY): 13 min  Charges:  $Gait Training: 8-22 mins                    G Codes:      Donald Donald [name] 09-05-2015, 11:35 AM Donald Donald [name], Donald [name]

## 2015-08-15 NOTE — Progress Notes (Signed)
Subjective: 1 Day Post-Op Procedure(s) (LRB): TOTAL HIP ARTHROPLASTY ANTERIOR APPROACH (Right) Patient reports pain as moderate. The patient is sitting up in chair. Appears comfortable. Taking by mouth and voiding okay.   Objective: Vital signs in last 24 hours: Temp:  [97.5 F (36.4 C)-98.4 F (36.9 C)] 97.9 F (36.6 C) (02/14 0500) Pulse Rate:  [73-99] 85 (02/14 0500) Resp:  [11-20] 17 (02/14 0500) BP: (103-183)/(52-86) 130/73 mmHg (02/14 0500) SpO2:  [98 %-100 %] 100 % (02/14 0500) Weight:  [149.415 kg (329 lb 6.4 oz)] 149.415 kg (329 lb 6.4 oz) (02/13 1145)  Intake/Output from previous day: 02/13 0701 - 02/14 0700 In: 2800 [I.V.:1700; IV Piggyback:1100] Out: 4350 [Urine:2850; Blood:1500] Intake/Output this shift: Total I/O In: 120 [P.O.:120] Out: -    Recent Labs  08/15/15 0902  HGB 10.6*    Recent Labs  08/15/15 0902  WBC 11.1*  RBC 3.65*  HCT 32.0*  PLT 257   No results for input(s): NA, K, CL, CO2, BUN, CREATININE, GLUCOSE, CALCIUM in the last 72 hours. No results for input(s): LABPT, INR in the last 72 hours. Right hip exam: Neurovascular intact Sensation intact distally Intact pulses distally Dorsiflexion/Plantar flexion intact Incision: dressing C/D/I Compartment soft  Assessment/Plan: 1 Day Post-Op Procedure(s) (LRB): TOTAL HIP ARTHROPLASTY ANTERIOR APPROACH (Right) Plan: Aspirin 325 mg twice daily with SCDs for DVT prophylaxis. Weight-bear as tolerated on right without hip precautions. Up with therapy Plan for discharge tomorrow  Donald [name] 08/15/2015, 9:48 AM

## 2015-08-15 NOTE — Telephone Encounter (Signed)
FYI

## 2015-08-16 ENCOUNTER — Encounter (HOSPITAL_COMMUNITY): Payer: Self-pay | Admitting: General Practice

## 2015-08-16 DIAGNOSIS — D62 Acute posthemorrhagic anemia: Secondary | ICD-10-CM

## 2015-08-16 LAB — CBC
HEMATOCRIT: 27.9 % — AB (ref 39.0–52.0)
HEMOGLOBIN: 9.2 g/dL — AB (ref 13.0–17.0)
MCH: 29.3 pg (ref 26.0–34.0)
MCHC: 33 g/dL (ref 30.0–36.0)
MCV: 88.9 fL (ref 78.0–100.0)
Platelets: 230 10*3/uL (ref 150–400)
RBC: 3.14 MIL/uL — ABNORMAL LOW (ref 4.22–5.81)
RDW: 13.9 % (ref 11.5–15.5)
WBC: 14.2 10*3/uL — ABNORMAL HIGH (ref 4.0–10.5)

## 2015-08-16 NOTE — Progress Notes (Signed)
Subjective: 2 Days Post-Op Procedure(s) (LRB): TOTAL HIP ARTHROPLASTY ANTERIOR APPROACH (Right) Patient reports pain as moderate.  Progressing with physical therapy. Taking by mouth and voiding okay. No dizziness. Ready to go home.  Objective: Vital signs in last 24 hours: Temp:  [97.5 F (36.4 C)-97.7 F (36.5 C)] 97.7 F (36.5 C) (02/15 0500) Pulse Rate:  [74-86] 74 (02/15 0500) Resp:  [16] 16 (02/15 0500) BP: (108-128)/(46-76) 108/46 mmHg (02/15 0500) SpO2:  [93 %-100 %] 93 % (02/15 0500)  Intake/Output from previous day: 02/14 0701 - 02/15 0700 In: 360 [P.O.:360] Out: -  Intake/Output this shift: Total I/O In: 240 [P.O.:240] Out: -    Recent Labs  08/14/15 1633 08/15/15 0902 08/16/15 0615  HGB 12.9* 10.6* 9.2*    Recent Labs  08/15/15 0902 08/16/15 0615  WBC 11.1* 14.2*  RBC 3.65* 3.14*  HCT 32.0* 27.9*  PLT 257 230    Recent Labs  08/14/15 1633 08/15/15 0902  NA 140 139  K 4.1 4.0  CL  --  106  CO2  --  18*  BUN  --  12  CREATININE  --  1.14  GLUCOSE 138* 196*  CALCIUM  --  8.9   No results for input(s): LABPT, INR in the last 72 hours. Right hip exam: Neurovascular intact Sensation intact distally Intact pulses distally Dorsiflexion/Plantar flexion intact Incision: dressing C/D/I Compartment soft  Assessment/Plan: 2 Days Post-Op Procedure(s) (LRB): TOTAL HIP ARTHROPLASTY ANTERIOR APPROACH (Right) Acute blood loss anemia, asymptomatic. Plan: Weight-bear as tolerated on right with no hip precautions. Aspirin 325 mg enteric-coated twice daily 1 month for DVT prophylaxis. Rx for Percocet and Robaxin. Up with therapy Discharge home with home health Follow-up with Dr. Berenice Primas in 2 weeks. Carlyle Achenbach G 08/16/2015, 11:54 AM

## 2015-08-16 NOTE — Progress Notes (Signed)
Physical Therapy Treatment Patient Details Name: Donald [name] MRN: [id number] DOB: July 26, 1955 Today's Date: 08/16/2015    History of Present Illness pt admitted for R THA. PMHx: Achilles tendon repair, Left TKA, HTN, gout    PT Comments    Pt has progressed well in the acute environment, mod I with transfers and gait. Progressing with exercises. Pt ambulated 500' today with RW. Advised him to address leg length discrepancy ASAP to prevent further altered gait pattern. PT signing off.    Follow Up Recommendations  Home health PT     Equipment Recommendations  None recommended by PT    Recommendations for Other Services       Precautions / Restrictions Precautions Precautions: None Restrictions Weight Bearing Restrictions: Yes RLE Weight Bearing: Weight bearing as tolerated    Mobility  Bed Mobility Overal bed mobility: Independent             General bed mobility comments: pt able to get up from bed flat position independently  Transfers Overall transfer level: Modified independent Equipment used: Rolling walker (2 wheeled) Transfers: Sit to/from Stand Sit to Stand: Modified independent (Device/Increase time)         General transfer comment: pt safe with transfers from multiple surfaces  Ambulation/Gait Ambulation/Gait assistance: Modified independent (Device/Increase time) Ambulation Distance (Feet): 500 Feet Assistive device: Rolling walker (2 wheeled) Gait Pattern/deviations: Step-through pattern;Antalgic Gait velocity: WFL Gait velocity interpretation: at or above normal speed for age/gender General Gait Details: pt with LLD, urged him to address this with shoe lift ASAP to prevent further hip and back problems. Also educated on consistent use of RW to maintain even gait pattern   Financial trader Rankin (Stroke Patients Only)       Balance Overall balance assessment: Modified  Independent Sitting-balance support: No upper extremity supported Sitting balance-Leahy Scale: Normal     Standing balance support: No upper extremity supported Standing balance-Leahy Scale: Good Standing balance comment: pt able to reach to floor to get object with no LOB                    Cognition Arousal/Alertness: Awake/alert Behavior During Therapy: WFL for tasks assessed/performed Overall Cognitive Status: Within Functional Limits for tasks assessed                      Exercises Total Joint Exercises Ankle Circles/Pumps: AROM;Both;10 reps;Seated Hip ABduction/ADduction: AROM;Standing;Right;15 reps;Left;Seated Knee Flexion: AROM;Standing;Right;15 reps;Both Marching in Standing: AROM;Standing;Right;15 reps;Left Standing Hip Extension: AROM;Standing;Right;15 reps General Exercises - Lower Extremity Heel Raises: AROM;10 reps;Standing Other Exercises Other Exercises: discussed ways to stretch right hip flexor as tightness evident with pt gait. Practiced today with standing hip extension    General Comments General comments (skin integrity, edema, etc.): discussed d/c issues including car trnasfer, proper nutrition, and activity level      Pertinent Vitals/Pain Pain Assessment: 0-10 Pain Score: 4  Pain Location: right hip Pain Descriptors / Indicators: Sore Pain Intervention(s): Monitored during session    Home Living                      Prior Function            PT Goals (current goals can now be found in the care plan section) Acute Rehab PT Goals Patient Stated Goal: walk without pain PT Goal Formulation: With patient Time For Goal Achievement:  08/22/15 Potential to Achieve Goals: Good    Frequency  7X/week    PT Plan Current plan remains appropriate    Co-evaluation             End of Session Equipment Utilized During Treatment: Gait belt Activity Tolerance: Patient tolerated treatment well Patient left: in bed;with  call bell/phone within reach     Time: 0851-0917 PT Time Calculation (min) (ACUTE ONLY): 26 min  Charges:  $Gait Training: 8-22 mins $Therapeutic Exercise: 8-22 mins                    G Codes:     Leighton Roach, PT  Acute Rehab Services  [phone]  Leighton Roach 08/16/2015, 9:54 AM

## 2015-08-16 NOTE — Discharge Summary (Signed)
Patient ID: Donald [name] MRN: [id number] DOB/AGE: 59-Feb-1957 59 y.o.  Admit date: 08/14/2015 Discharge date: 08/16/2015  Admission Diagnoses:  Principal Problem:   Primary osteoarthritis of right hip Active Problems:   Severe obesity (BMI >= 40) ([place])   Discharge Diagnoses:  Same  Past Medical History  Diagnosis Date  . Hypertension   . Bronchitis     HISTORY FEW MO AGO  . Arthritis   . Diverticulitis   . Colon polyps   . Diverticulosis 04/07/2013  . Colitis   . Gout   . Hyperthyroidism     PATIENT DENIES     Surgeries: Procedure(s): Right TOTAL HIP ARTHROPLASTY ANTERIOR APPROACH  On 08/14/15. Discharged Condition: Improved  Hospital Course: Donald [name] is an 60 y.o. male who was admitted 08/14/2015 for operative treatment ofPrimary osteoarthritis of right hip. Patient has severe unremitting pain that affects sleep, daily activities, and work/hobbies. After pre-op clearance the patient was taken to the operating room on 08/14/2015 and underwent  Procedure(s): Right TOTAL HIP ARTHROPLASTY ANTERIOR APPROACH.    Patient was given perioperative antibiotics: Anti-infectives    Start     Dose/Rate Route Frequency Ordered Stop   08/14/15 2000  ceFAZolin (ANCEF) IVPB 2 g/50 mL premix     2 g 100 mL/hr over 30 Minutes Intravenous Every 6 hours 08/14/15 1840 08/15/15 0326   08/14/15 1230  ceFAZolin (ANCEF) 3 g in dextrose 5 % 50 mL IVPB     3 g 130 mL/hr over 30 Minutes Intravenous To [facility]Stay [facility] 08/11/15 0928 08/14/15 1352       Patient was given sequential compression devices, early ambulation, and chemoprophylaxis to prevent DVT. The patient progressed well with physical therapy. On the date of discharge his dressing was clean and dry. He was afebrile. He was taking oral pain medication and he reported he was ready for discharge home. He was passing gas at the time.  Patient benefited maximally from hospital stay and there were no complications.    Recent vital  signs: Patient Vitals for the past 24 hrs:  BP Temp Temp src Pulse Resp SpO2  08/16/15 0500 (!) 108/46 mmHg 97.7 F (36.5 C) Oral 74 16 93 %  08/15/15 1300 128/76 mmHg 97.5 F (36.4 C) Oral 86 16 100 %     Recent laboratory studies:  Recent Labs  08/14/15 1633 08/15/15 0902 08/16/15 0615  WBC  --  11.1* 14.2*  HGB 12.9* 10.6* 9.2*  HCT 38.0* 32.0* 27.9*  PLT  --  257 230  NA 140 139  --   K 4.1 4.0  --   CL  --  106  --   CO2  --  18*  --   BUN  --  12  --   CREATININE  --  1.14  --   GLUCOSE 138* 196*  --   CALCIUM  --  8.9  --      Discharge Medications:     Medication List    STOP taking these medications        aspirin 81 MG tablet  Replaced by:  aspirin EC 325 MG tablet     ibuprofen 200 MG tablet  Commonly known as:  ADVIL,MOTRIN      TAKE these medications        allopurinol 100 MG tablet  Commonly known as:  ZYLOPRIM  Take 100 mg by mouth daily as needed (for gout flare ups).     aspirin EC 325 MG tablet  Take 1 tablet (325 mg total) by mouth 2 (two) times daily after a meal. Take x 1 month post op to decrease risk of blood clots.     losartan 100 MG tablet  Commonly known as:  COZAAR  TAKE 1 TABLET BY MOUTH EVERY DAY     methocarbamol 750 MG tablet  Commonly known as:  ROBAXIN-750  Take 1 tablet (750 mg total) by mouth every 8 (eight) hours as needed for muscle spasms.     oxyCODONE-acetaminophen 5-325 MG tablet  Commonly known as:  PERCOCET/ROXICET  Take 1-2 tablets by mouth every 4 (four) hours as needed for severe pain.     traZODone 50 MG tablet  Commonly known as:  DESYREL  TAKE 1/2 - 1 TABLET BY MOUTH AT BEDTIME AS NEEDED FOR INSOMNIA        Diagnostic Studies: Dg Chest 2 View  08/04/2015  CLINICAL DATA:  Preoperative evaluation for upcoming hip surgery EXAM: CHEST  2 VIEW COMPARISON:  01/04/2013 FINDINGS: The heart size and mediastinal contours are within normal limits. Both lungs are clear. The visualized skeletal structures are  unremarkable. IMPRESSION: No active cardiopulmonary disease. Electronically Signed   By: Inez Catalina M.D.   On: 08/04/2015 15:31   Dg Hip Operative Unilat With Pelvis Right  08/14/2015  CLINICAL DATA:  Right total hip arthroplasty. EXAM: OPERATIVE RIGHT HIP (WITH PELVIS IF PERFORMED) TWO VIEWS TECHNIQUE: Fluoroscopic spot image(s) were submitted for interpretation post-operatively. COMPARISON:  11/24/2012 right hip radiographs FINDINGS: Fluoroscopy time 53 seconds. Two nondiagnostic spot fluoroscopic intraoperative right hip radiographs demonstrate postsurgical changes from right total hip arthroplasty. IMPRESSION: Intraoperative fluoroscopic guidance for right total hip arthroplasty. Electronically Signed   By: Ilona Sorrel M.D.   On: 08/14/2015 16:45    Disposition: 06-Home-Health Care Svc      Discharge Instructions    Call MD / Call 911    Complete by:  As directed   If you experience chest pain or shortness of breath, CALL 911 and be transported to the hospital emergency room.  If you develope a fever above 101 F, pus (white drainage) or increased drainage or redness at the wound, or calf pain, call your surgeon's office.     Constipation Prevention    Complete by:  As directed   Drink plenty of fluids.  Prune juice may be helpful.  You may use a stool softener, such as Colace (over the counter) 100 mg twice a day.  Use MiraLax (over the counter) for constipation as needed.     Diet general    Complete by:  As directed      Do not sit on low chairs, stoools or toilet seats, as it may be difficult to get up from low surfaces    Complete by:  As directed      Increase activity slowly as tolerated    Complete by:  As directed      Weight bearing as tolerated    Complete by:  As directed   Laterality:  right  Extremity:  Lower           Follow-up Information    Follow up with GRAVES,JOHN L, MD. Schedule an appointment as soon as possible for a visit in 2 weeks.   Specialty:   Orthopedic Surgery   Contact information:   [place] [address] [phone]       Follow up with [facility].   Why:  They will contact you to  schedule home therapy visits.   Contact information:   [address] [place] [address] [phone]        Signed: Erlene Senters 08/16/2015, 11:58 AM

## 2015-09-11 ENCOUNTER — Other Ambulatory Visit: Payer: Self-pay | Admitting: Family Medicine

## 2015-10-15 ENCOUNTER — Other Ambulatory Visit: Payer: Self-pay | Admitting: Family Medicine

## 2016-02-25 ENCOUNTER — Other Ambulatory Visit: Payer: Self-pay | Admitting: Family Medicine

## 2016-02-26 NOTE — Telephone Encounter (Signed)
Refill denied. Pt has not been seen in over a year.  

## 2016-04-18 ENCOUNTER — Other Ambulatory Visit: Payer: Self-pay | Admitting: Family Medicine

## 2016-05-19 ENCOUNTER — Other Ambulatory Visit: Payer: Self-pay | Admitting: Family Medicine

## 2016-06-22 ENCOUNTER — Other Ambulatory Visit: Payer: Self-pay | Admitting: Family Medicine

## 2016-06-27 NOTE — Telephone Encounter (Signed)
My guess is gout flare - can you check with him as to what is going on that he needs this? I am ok with #30 for twice a day usage until flare resolves if this is gout related. We will need to discuss in future as he came off allopurinol

## 2016-06-28 NOTE — Telephone Encounter (Signed)
Left message on voicemail to call office. Checking with pt to see if he is having a flare per Dr.Hunter's note.

## 2016-07-09 IMAGING — RF DG HIP (WITH PELVIS) OPERATIVE*R*
1 series · 2 of 2 positions shown · non-contrast
Comparison: 11/24/2012 right hip radiographs

CLINICAL DATA: Right total hip arthroplasty.

EXAM:
OPERATIVE RIGHT HIP (WITH PELVIS IF PERFORMED) TWO VIEWS
TECHNIQUE: Fluoroscopic spot image(s) were submitted for interpretation
post-operatively.

[Series 1: run · 2 of 2 slices shown]
[im 1/2]
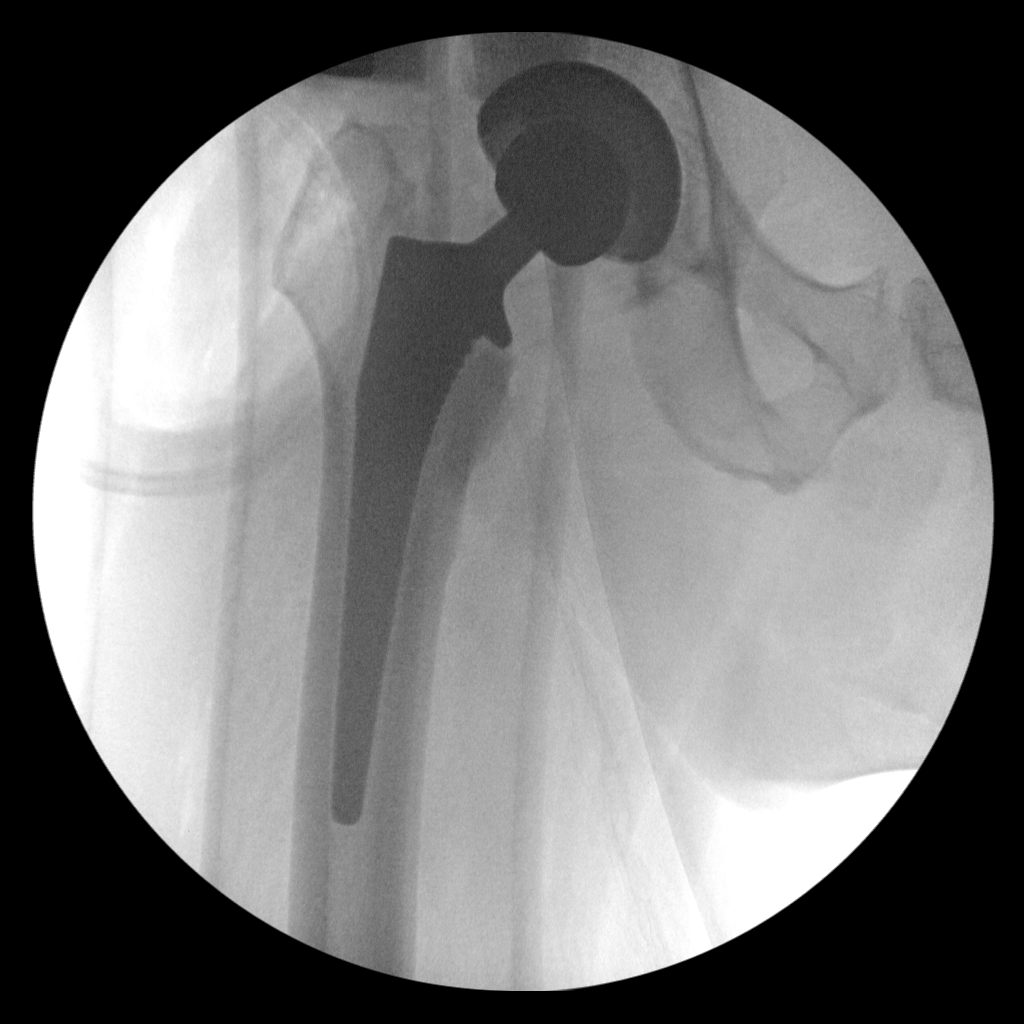
[im 2/2]
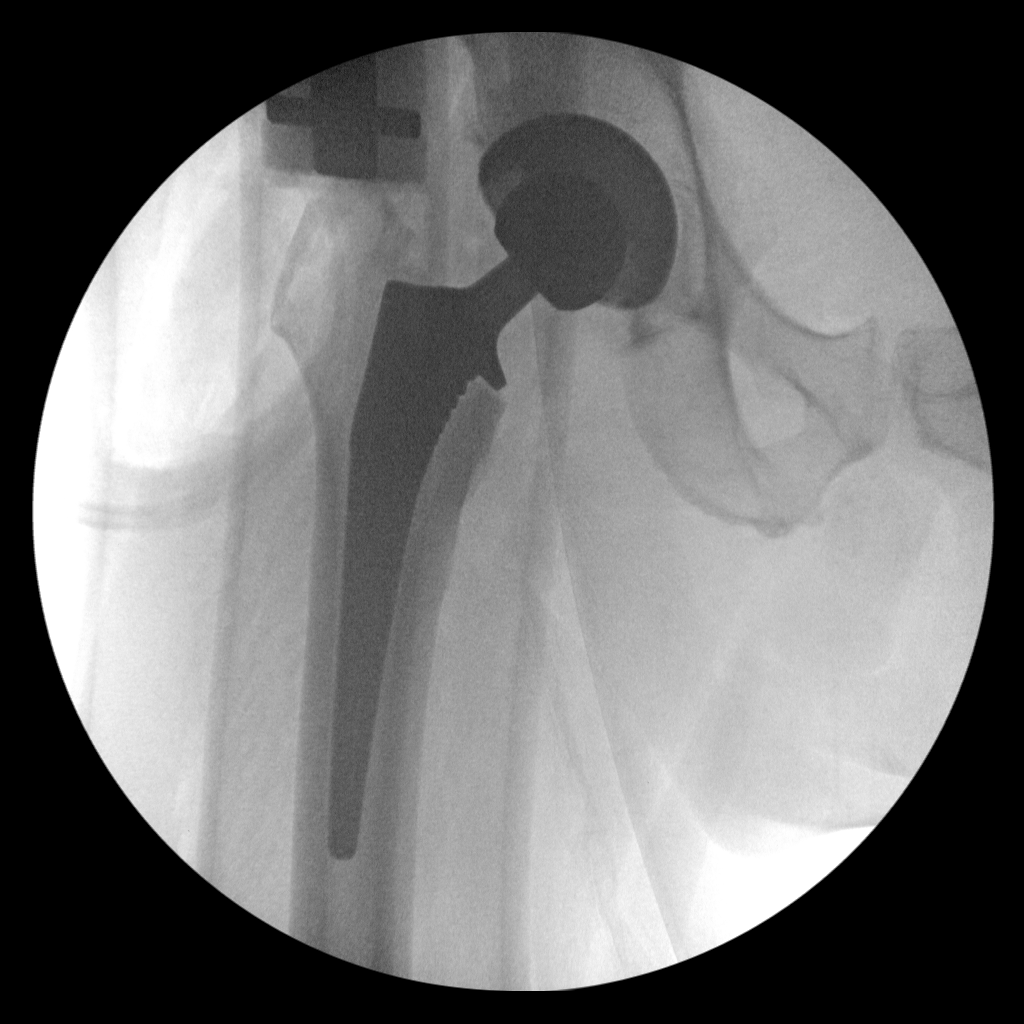

[2 of 2 positions shown; findings below may reference images not displayed]

FINDINGS: Fluoroscopy time 53 seconds. Two nondiagnostic spot fluoroscopic
intraoperative right hip radiographs demonstrate postsurgical
changes from right total hip arthroplasty.
IMPRESSION: Intraoperative fluoroscopic guidance for right total hip
arthroplasty.

## 2016-07-26 NOTE — Telephone Encounter (Signed)
Called and left a voicemail for patient asking for a return phone call

## 2016-08-23 ENCOUNTER — Encounter: Payer: Self-pay | Admitting: Family Medicine

## 2016-08-23 ENCOUNTER — Ambulatory Visit (INDEPENDENT_AMBULATORY_CARE_PROVIDER_SITE_OTHER): Payer: BLUE CROSS/BLUE SHIELD | Admitting: Family Medicine

## 2016-08-23 VITALS — BP 150/92 | HR 74 | Temp 97.5°F | Ht 75.0 in | Wt 338.6 lb

## 2016-08-23 DIAGNOSIS — J209 Acute bronchitis, unspecified: Secondary | ICD-10-CM | POA: Diagnosis not present

## 2016-08-23 MED ORDER — PREDNISONE 20 MG PO TABS: ORAL_TABLET | ORAL | 0 refills | Status: DC

## 2016-08-23 MED ORDER — DICLOFENAC SODIUM 75 MG PO TBEC: 75.0000 mg | DELAYED_RELEASE_TABLET | Freq: Two times a day (BID) | ORAL | 2 refills | Status: DC

## 2016-08-23 NOTE — Progress Notes (Signed)
Pre visit review using our clinic review tool, if applicable. No additional management support is needed unless otherwise documented below in the visit note. 

## 2016-08-23 NOTE — Progress Notes (Signed)
PCP: Garret Reddish, MD  Subjective:  Donald [name] is a 61 y.o. year old very pleasant male patient who presents with  symptoms including  cough, chest congestion.   2 weeks ago head cold then thought had a cold with cough, congestion. Spitting up white sputum. Started a week ago with more significant chest congestion, cough and some wheeze. Symptoms are not improving, in fact may be worsening. Used to occur about once a year last 2013 and 2014. States prednisone helped him tremendously -previous treatments: cough drops, rest, hydration -sick contacts/travel/risks: denies flu exposure.   ROS-denies fever, NVD, tooth pain. Denies significant shortness of breath.   Pertinent Past Medical History-  Patient Active Problem List   Diagnosis Date Noted  . Gout, unspecified 05/11/2010    Priority: Medium  . Essential hypertension 12/14/2009    Priority: Medium  . Infection due to Enterobacteriaceae     Priority: Low  . S/P Achilles tendon repair 04/12/2015    Priority: Low  . Achilles tendinitis of both lower extremities 06/20/2014    Priority: Low  . Leg length difference, acquired 06/20/2014    Priority: Low  . Osteoarthritis of left knee 01/08/2013    Priority: Low  . Allergic rhinitis, seasonal 11/04/2012    Priority: Low  . INSOMNIA-SLEEP DISORDER-UNSPEC 01/09/2010    Priority: Low  . Primary osteoarthritis of right hip 08/14/2015  . Severe obesity (BMI >= 40) ([place]) 08/14/2015  . Hematochezia 04/07/2013    Medications- reviewed  Current Outpatient Prescriptions  Medication Sig Dispense Refill  . aspirin EC 325 MG tablet Take 1 tablet (325 mg total) by mouth 2 (two) times daily after a meal. Take x 1 month post op to decrease risk of blood clots. 60 tablet 0  . losartan (COZAAR) 100 MG tablet TAKE 1 TABLET BY MOUTH EVERY DAY 90 tablet 2  . traZODone (DESYREL) 50 MG tablet TAKE 1/2 TO 1 TABLET BY MOUTH AT BEDTIME AS NEEDED FOR INSOMNIA 30 tablet 5  . diclofenac (VOLTAREN) 75  MG EC tablet Take 1 tablet (75 mg total) by mouth 2 (two) times daily. As needed for arthritis pain 60 tablet 2   No current facility-administered medications for this visit.     Objective: BP (!) 150/92   Pulse 74   Temp 97.5 F (36.4 C) (Oral)   Ht 6\' 3"  (1.905 m)   Wt (!) 338 lb 9.6 oz (153.6 kg)   SpO2 94%   BMI 42.32 kg/m  Gen: NAD, resting comfortably HEENT: Turbinates erythematous with some clear drainage, TM normal, pharynx mildly erythematous with no tonsilar exudate or edema, no sinus tenderness CV: RRR no murmurs rubs or gallops Lungs: CTAB no crackles, wheeze, rhonchi  Ext: no edema Skin: warm, dry, no rash  Assessment/Plan:  Bronchitis History and exam today are suggestive of viral infection most likely due to bronchitis. Symptomatic treatment with: mucinex- DM. Needs to avoid all decongestants Discussed prednisone:has had great response in past to this so we opted to treat.   We discussed that we did not find any infection that had higher probability of being bacterial such as pneumonia, strep throat, ear infection, bacterial sinusitis. We discussed signs that bacterial infection may have developed particularly fever or shortness of breath and reasons for follow up (symptoms worsen, last past expected time frame, new concerns arise).  Likely course of 3-6 weeks. Patient is contagious and advised good handwashing and consideration of mask If going to be in public places.   Gout S:  has not been on allopurinol for some time A/P: requests to have diclofenac on hand in case flare but also has OA R hip, left ankle and uses some for pain on workdays. I did refill this but warned him to monitor BP as elevated today before taking   Finally please schedule a physical in next few months with labs a few days before   Meds ordered this encounter  Medications  . diclofenac (VOLTAREN) 75 MG EC tablet    Sig: Take 1 tablet (75 mg total) by mouth 2 (two) times daily. As needed  for arthritis pain    Dispense:  60 tablet    Refill:  2  . predniSONE (DELTASONE) 20 MG tablet    Sig: Take 2 pills for 3 days, 1 pill for 4 days    Dispense:  10 tablet    Refill:  0    Garret Reddish, MD

## 2016-08-23 NOTE — Patient Instructions (Signed)
Bronchitis History and exam today are suggestive of viral infection most likely due to bronchitis. Symptomatic treatment with: mucinex- DM. Needs to avoid all decongestants Discussed prednisone:has had great response in past to this so we opted to treat.   We discussed that we did not find any infection that had higher probability of being bacterial such as pneumonia, strep throat, ear infection, bacterial sinusitis. We discussed signs that bacterial infection may have developed particularly fever or shortness of breath and reasons for follow up (symptoms worsen, last past expected time frame, new concerns arise).  Likely course of 3-6 weeks. Patient is contagious and advised good handwashing and consideration of mask If going to be in public places.   Meds ordered this encounter  Medications  . diclofenac (VOLTAREN) 75 MG EC tablet    Sig: Take 1 tablet (75 mg total) by mouth 2 (two) times daily. As needed for arthritis pain    Dispense:  60 tablet    Refill:  2  . predniSONE (DELTASONE) 20 MG tablet    Sig: Take 2 pills for 3 days, 1 pill for 4 days    Dispense:  10 tablet    Refill:  0   Finally please schedule a physical in next few months with labs a few days before

## 2016-08-23 NOTE — Assessment & Plan Note (Signed)
S: has not been on allopurinol for some time A/P: requests to have diclofenac on hand in case flare but also has OA R hip, left ankle and uses some for pain on workdays. I did refill this but warned him to monitor BP as elevated today before taking

## 2016-10-04 ENCOUNTER — Ambulatory Visit (INDEPENDENT_AMBULATORY_CARE_PROVIDER_SITE_OTHER): Payer: BLUE CROSS/BLUE SHIELD | Admitting: Family Medicine

## 2016-10-04 ENCOUNTER — Encounter: Payer: Self-pay | Admitting: Family Medicine

## 2016-10-04 VITALS — BP 158/88 | HR 82 | Temp 97.9°F | Ht 75.0 in | Wt 334.6 lb

## 2016-10-04 DIAGNOSIS — I10 Essential (primary) hypertension: Secondary | ICD-10-CM | POA: Diagnosis not present

## 2016-10-04 DIAGNOSIS — L723 Sebaceous cyst: Secondary | ICD-10-CM

## 2016-10-04 DIAGNOSIS — L989 Disorder of the skin and subcutaneous tissue, unspecified: Secondary | ICD-10-CM

## 2016-10-04 MED ORDER — AMLODIPINE BESYLATE 5 MG PO TABS: 5.0000 mg | ORAL_TABLET | Freq: Every day | ORAL | 3 refills | Status: DC

## 2016-10-04 MED ORDER — LOSARTAN POTASSIUM 100 MG PO TABS: 100.0000 mg | ORAL_TABLET | Freq: Every day | ORAL | 3 refills | Status: DC

## 2016-10-04 NOTE — Progress Notes (Signed)
Subjective:  Donald [name] is a 61 y.o. year old very pleasant male patient who presents for/with See problem oriented charting ROS- No chest pain or shortness of breath. No headache or blurry vision. Mild edema not worsening   Past Medical History-  Patient Active Problem List   Diagnosis Date Noted  . Gout 05/11/2010    Priority: Medium  . Essential hypertension 12/14/2009    Priority: Medium  . Infection due to Enterobacteriaceae     Priority: Low  . S/P Achilles tendon repair 04/12/2015    Priority: Low  . Achilles tendinitis of both lower extremities 06/20/2014    Priority: Low  . Leg length difference, acquired 06/20/2014    Priority: Low  . Osteoarthritis of left knee 01/08/2013    Priority: Low  . Allergic rhinitis, seasonal 11/04/2012    Priority: Low  . INSOMNIA-SLEEP DISORDER-UNSPEC 01/09/2010    Priority: Low  . Primary osteoarthritis of right hip 08/14/2015  . Severe obesity (BMI >= 40) ([place]) 08/14/2015  . Hematochezia 04/07/2013    Medications- reviewed and updated Current Outpatient Prescriptions  Medication Sig Dispense Refill  . aspirin EC 325 MG tablet Take 1 tablet (325 mg total) by mouth 2 (two) times daily after a meal. Take x 1 month post op to decrease risk of blood clots. 60 tablet 0  . losartan (COZAAR) 100 MG tablet Take 1 tablet (100 mg total) by mouth daily. 90 tablet 3  . traZODone (DESYREL) 50 MG tablet TAKE 1/2 TO 1 TABLET BY MOUTH AT BEDTIME AS NEEDED FOR INSOMNIA 30 tablet 5  . amLODipine (NORVASC) 5 MG tablet Take 1 tablet (5 mg total) by mouth daily. 90 tablet 3  . diclofenac (VOLTAREN) 75 MG EC tablet Take 1 tablet (75 mg total) by mouth 2 (two) times daily. As needed for arthritis pain (Patient not taking: Reported on 10/04/2016) 60 tablet 2   No current facility-administered medications for this visit.     Objective: BP (!) 158/88 (BP Location: Left Arm, Patient Position: Sitting, Cuff Size: Large)   Pulse 82   Temp 97.9 F (36.6 C)  (Oral)   Ht 6\' 3"  (1.905 m)   Wt (!) 334 lb 9.6 oz (151.8 kg)   SpO2 96%   BMI 41.82 kg/m  Gen: NAD, resting comfortably Base of right neck he has a 3 x 3 cm somewhat firm lesion- small whitehead in center with palpation caused slight cottage cheese discharge There is also a dark lesion < 5 mm on right upper chest CV: RRR no murmurs rubs or gallops Lungs: CTAB no crackles, wheeze, rhonchi Abdomen: soft/nontender/nondistended/normal bowel sounds. No rebound or guarding.  Ext: trace edema Skin: warm, dry  Assessment/Plan:  Sebaceous cyst - Plan: Ambulatory referral to Dermatology Skin lesion - Plan: Ambulatory referral to Dermatology S: cyst on right upper chest for over 20 years but seems to be enlarging slowly. Minimal pain. Slightly red but usually only when he tries to manipulate it. Has not drained much at home  For at least 5 years dark black spot on right upper chest- seems to be enlarging slight A/P:  Derm referral- wants to consider excision of cyst. Also want their opinion on lesion on right upper chest  Essential hypertension S: controlled poorly on losartan 100mg .  BP Readings from Last 3 Encounters:  10/04/16 (!) 158/88  08/23/16 (!) 150/92  08/16/15 (!) 108/46  A/P:Continue current meds:  Add amlodipine 5mg . He does not want to exercise, does not want to lose  weight and tells me "not to waste my breath" on talking to him about this.    6 weeks  Orders Placed This Encounter  Procedures  . Ambulatory referral to Dermatology    Referral Priority:   Routine    Referral Type:   Consultation    Referral Reason:   Specialty Services Required    Requested Specialty:   Dermatology    Number of Visits Requested:   1   Meds ordered this encounter  Medications  . losartan (COZAAR) 100 MG tablet    Sig: Take 1 tablet (100 mg total) by mouth daily.    Dispense:  90 tablet    Refill:  3  . amLODipine (NORVASC) 5 MG tablet    Sig: Take 1 tablet (5 mg total) by mouth  daily.    Dispense:  90 tablet    Refill:  3   Return precautions advised.  Garret Reddish, MD

## 2016-10-04 NOTE — Assessment & Plan Note (Signed)
S: controlled poorly on losartan 100mg .  BP Readings from Last 3 Encounters:  10/04/16 (!) 158/88  08/23/16 (!) 150/92  08/16/15 (!) 108/46  A/P:Continue current meds:  Add amlodipine 5mg . He does not want to exercise, does not want to lose weight and tells me "not to waste my breath" on talking to him about this.

## 2016-10-04 NOTE — Progress Notes (Signed)
Pre visit review using our clinic review tool, if applicable. No additional management support is needed unless otherwise documented below in the visit note. 

## 2016-10-04 NOTE — Patient Instructions (Signed)
We will call you within a week or two about your referral to dermatology. If you do not hear within 3 weeks, give [place] a call.   Start amlodipine 5mg . Continue losartan 100mg . See me again in 6 weeks for recheck blood pressure. Could also check #s at home and bring cuff with you

## 2016-11-15 ENCOUNTER — Encounter: Payer: Self-pay | Admitting: Family Medicine

## 2016-11-15 ENCOUNTER — Ambulatory Visit (INDEPENDENT_AMBULATORY_CARE_PROVIDER_SITE_OTHER): Payer: BLUE CROSS/BLUE SHIELD | Admitting: Family Medicine

## 2016-11-15 DIAGNOSIS — I10 Essential (primary) hypertension: Secondary | ICD-10-CM

## 2016-11-15 MED ORDER — NEBIVOLOL HCL 5 MG PO TABS: 5.0000 mg | ORAL_TABLET | Freq: Every day | ORAL | 5 refills | Status: DC

## 2016-11-15 NOTE — Assessment & Plan Note (Signed)
S: controlled poorly on losartan 100mg  and amlodipine 5mg . Trend is improving in office though for systolic- diastolic still with elevation. Home readings range from 145-156 over 83-93 over last 2 weeks BP Readings from Last 3 Encounters:  11/15/16 (!) 136/94  10/04/16 (!) 158/88  08/23/16 (!) 150/92  A/P:Continue current meds:  But add bystolic 5mg . Follow up 3 weeks. Stress from jury duty appears to be affecting blood pressure- see letter written today

## 2016-11-15 NOTE — Progress Notes (Addendum)
Subjective:  Donald [name] is a 61 y.o. year old very pleasant male patient who presents for/with See problem oriented charting ROS- No chest pain or shortness of breath. No headache or blurry vision.    Past Medical History-  Patient Active Problem List   Diagnosis Date Noted  . Gout 05/11/2010    Priority: Medium  . Essential hypertension 12/14/2009    Priority: Medium  . Infection due to Enterobacteriaceae     Priority: Low  . S/P Achilles tendon repair 04/12/2015    Priority: Low  . Achilles tendinitis of both lower extremities 06/20/2014    Priority: Low  . Leg length difference, acquired 06/20/2014    Priority: Low  . Osteoarthritis of left knee 01/08/2013    Priority: Low  . Allergic rhinitis, seasonal 11/04/2012    Priority: Low  . INSOMNIA-SLEEP DISORDER-UNSPEC 01/09/2010    Priority: Low  . Primary osteoarthritis of right hip 08/14/2015  . Severe obesity (BMI >= 40) ([place]) 08/14/2015  . Hematochezia 04/07/2013    Medications- reviewed and updated Current Outpatient Prescriptions  Medication Sig Dispense Refill  . amLODipine (NORVASC) 5 MG tablet Take 1 tablet (5 mg total) by mouth daily. 90 tablet 3  . aspirin EC 325 MG tablet Take 1 tablet (325 mg total) by mouth 2 (two) times daily after a meal. Take x 1 month post op to decrease risk of blood clots. 60 tablet 0  . diclofenac (VOLTAREN) 75 MG EC tablet Take 1 tablet (75 mg total) by mouth 2 (two) times daily. As needed for arthritis pain 60 tablet 2  . losartan (COZAAR) 100 MG tablet Take 1 tablet (100 mg total) by mouth daily. 90 tablet 3  . traZODone (DESYREL) 50 MG tablet TAKE 1/2 TO 1 TABLET BY MOUTH AT BEDTIME AS NEEDED FOR INSOMNIA 30 tablet 5    Objective: BP (!) 136/94   Pulse 82   Temp 97.7 F (36.5 C) (Oral)   Ht 6\' 3"  (1.905 m)   Wt (!) 337 lb 9.6 oz (153.1 kg)   SpO2 95%   BMI 42.20 kg/m  Gen: NAD, resting comfortably CV: RRR no murmurs rubs or gallops Lungs: CTAB no crackles, wheeze,  rhonchi Abdomen: soft/nontender/nondistended/normal bowel sounds. No rebound or guarding.  Ext: trace stable edema Skin: warm, dry, stable dark colored macule on right chest, smaller cyst near clavicle on right  Assessment/Plan:  Essential hypertension S: controlled poorly on losartan 100mg  and amlodipine 5mg . Trend is improving in office though for systolic- diastolic still with elevation. Home readings range from 145-156 over 83-93 over last 2 weeks BP Readings from Last 3 Encounters:  11/15/16 (!) 136/94  10/04/16 (!) 158/88  08/23/16 (!) 150/92  A/P:Continue current meds:  But add bystolic 5mg . Follow up 3 weeks. Stress from jury duty appears to be affecting blood pressure- see letter written today  Sees dermatology on Monday- for spot near neck and on right chest. One appears to be cyst as smaller than last visit  3 weeks  Meds ordered this encounter  Medications  . nebivolol (BYSTOLIC) 5 MG tablet    Sig: Take 1 tablet (5 mg total) by mouth daily.    Dispense:  30 tablet    Refill:  5    Return precautions advised.  Garret Reddish, MD

## 2016-11-15 NOTE — Patient Instructions (Signed)
Add bystolic 5mg   Continue amlodipine and losartan. Follow up with me in about 3 weeks

## 2016-12-05 ENCOUNTER — Other Ambulatory Visit: Payer: Self-pay

## 2016-12-05 MED ORDER — DICLOFENAC SODIUM 75 MG PO TBEC: 75.0000 mg | DELAYED_RELEASE_TABLET | Freq: Two times a day (BID) | ORAL | 2 refills | Status: DC

## 2016-12-06 ENCOUNTER — Ambulatory Visit (INDEPENDENT_AMBULATORY_CARE_PROVIDER_SITE_OTHER): Payer: BLUE CROSS/BLUE SHIELD | Admitting: Family Medicine

## 2016-12-06 ENCOUNTER — Encounter: Payer: Self-pay | Admitting: Family Medicine

## 2016-12-06 DIAGNOSIS — I10 Essential (primary) hypertension: Secondary | ICD-10-CM | POA: Diagnosis not present

## 2016-12-06 NOTE — Progress Notes (Signed)
Subjective:  Donald [name] is a 61 y.o. year old very pleasant male patient who presents for/with See problem oriented charting ROS- No chest pain or shortness of breath. No headache or blurry vision.    Past Medical History-  Patient Active Problem List   Diagnosis Date Noted  . Gout 05/11/2010    Priority: Medium  . Essential hypertension 12/14/2009    Priority: Medium  . Infection due to Enterobacteriaceae     Priority: Low  . S/P Achilles tendon repair 04/12/2015    Priority: Low  . Achilles tendinitis of both lower extremities 06/20/2014    Priority: Low  . Leg length difference, acquired 06/20/2014    Priority: Low  . Osteoarthritis of left knee 01/08/2013    Priority: Low  . Allergic rhinitis, seasonal 11/04/2012    Priority: Low  . INSOMNIA-SLEEP DISORDER-UNSPEC 01/09/2010    Priority: Low  . Primary osteoarthritis of right hip 08/14/2015  . Severe obesity (BMI >= 40) ([place]) 08/14/2015  . Hematochezia 04/07/2013    Medications- reviewed and updated Current Outpatient Prescriptions  Medication Sig Dispense Refill  . amLODipine (NORVASC) 5 MG tablet Take 1 tablet (5 mg total) by mouth daily. 90 tablet 3  . aspirin EC 325 MG tablet Take 1 tablet (325 mg total) by mouth 2 (two) times daily after a meal. Take x 1 month post op to decrease risk of blood clots. 60 tablet 0  . diclofenac (VOLTAREN) 75 MG EC tablet Take 1 tablet (75 mg total) by mouth 2 (two) times daily. As needed for arthritis pain 60 tablet 2  . losartan (COZAAR) 100 MG tablet Take 1 tablet (100 mg total) by mouth daily. 90 tablet 3  . nebivolol (BYSTOLIC) 5 MG tablet Take 1 tablet (5 mg total) by mouth daily. 30 tablet 5  . traZODone (DESYREL) 50 MG tablet TAKE 1/2 TO 1 TABLET BY MOUTH AT BEDTIME AS NEEDED FOR INSOMNIA 30 tablet 5   No current facility-administered medications for this visit.     Objective: BP 124/86 (BP Location: Left Arm, Patient Position: Sitting, Cuff Size: Large)   Pulse 63    Temp 98 F (36.7 C) (Oral)   Ht 6\' 3"  (1.905 m)   Wt (!) 341 lb 12.8 oz (155 kg)   SpO2 97%   BMI 42.72 kg/m  Gen: NAD, resting comfortably CV: RRR  Lungs: nonlabored Obese  Assessment/Plan:  Essential hypertension S: controlled on losartan 100mg , amlodipine 5mg , bystolic 5mg .  ASCVD 10 year risk calculation if age 48-79: not elevated- LDL is 65 last year, update within next year BP Readings from Last 3 Encounters:  12/06/16 124/86  11/15/16 (!) 136/94  10/04/16 (!) 158/88  A/P: We discussed blood pressure goal of <140/90. Continue current meds bystolic $00 for 90 days though- only concern   Return in about 4 months (around 04/07/2017) for physical. Knows this will be at [facility]  Return precautions advised.  Garret Reddish, MD

## 2016-12-06 NOTE — Assessment & Plan Note (Signed)
S: controlled on losartan 100mg , amlodipine 5mg , bystolic 5mg .  ASCVD 10 year risk calculation if age 60-79: not elevated- LDL is 65 last year, update within next year BP Readings from Last 3 Encounters:  12/06/16 124/86  11/15/16 (!) 136/94  10/04/16 (!) 158/88  A/P: We discussed blood pressure goal of <140/90. Continue current meds bystolic $01 for 90 days though- only concern

## 2016-12-06 NOTE — Patient Instructions (Signed)
Blood pressure looks great. Continue current medications

## 2016-12-09 ENCOUNTER — Telehealth: Payer: Self-pay | Admitting: *Deleted

## 2016-12-09 MED ORDER — DICLOFENAC SODIUM 75 MG PO TBEC: 75.0000 mg | DELAYED_RELEASE_TABLET | Freq: Two times a day (BID) | ORAL | 2 refills | Status: DC

## 2016-12-09 NOTE — Telephone Encounter (Signed)
May change to 90 days [name]

## 2016-12-09 NOTE — Telephone Encounter (Signed)
Rx done. 

## 2016-12-09 NOTE — Telephone Encounter (Signed)
[facility] faxed a request for a 90 day supply of Trazodone 50mg .

## 2016-12-10 ENCOUNTER — Other Ambulatory Visit: Payer: Self-pay

## 2016-12-10 MED ORDER — TRAZODONE HCL 50 MG PO TABS: ORAL_TABLET | ORAL | 1 refills | Status: DC

## 2016-12-10 NOTE — Telephone Encounter (Signed)
90 day prescription sent to pharmacy 

## 2016-12-13 ENCOUNTER — Other Ambulatory Visit: Payer: Self-pay | Admitting: Family Medicine

## 2016-12-13 MED ORDER — DICLOFENAC SODIUM 75 MG PO TBEC: 75.0000 mg | DELAYED_RELEASE_TABLET | Freq: Two times a day (BID) | ORAL | 1 refills | Status: DC

## 2016-12-13 NOTE — Telephone Encounter (Signed)
Received a fax from the pharmacy.  Pt would like a 90 day supply.

## 2017-05-12 ENCOUNTER — Other Ambulatory Visit: Payer: Self-pay

## 2017-05-12 MED ORDER — NEBIVOLOL HCL 5 MG PO TABS: 5.0000 mg | ORAL_TABLET | Freq: Every day | ORAL | 0 refills | Status: DC

## 2017-07-14 ENCOUNTER — Other Ambulatory Visit: Payer: Self-pay

## 2017-07-14 ENCOUNTER — Emergency Department (HOSPITAL_COMMUNITY)
Admission: EM | Admit: 2017-07-14 | Discharge: 2017-07-14 | Disposition: A | Discharge status: Home / Self Care | Payer: BLUE CROSS/BLUE SHIELD | Attending: Emergency Medicine | Admitting: Emergency Medicine

## 2017-07-14 ENCOUNTER — Encounter (HOSPITAL_COMMUNITY): Payer: Self-pay | Admitting: Emergency Medicine

## 2017-07-14 DIAGNOSIS — Z96641 Presence of right artificial hip joint: Secondary | ICD-10-CM | POA: Diagnosis not present

## 2017-07-14 DIAGNOSIS — Z79899 Other long term (current) drug therapy: Secondary | ICD-10-CM | POA: Diagnosis not present

## 2017-07-14 DIAGNOSIS — E039 Hypothyroidism, unspecified: Secondary | ICD-10-CM | POA: Diagnosis not present

## 2017-07-14 DIAGNOSIS — Z96652 Presence of left artificial knee joint: Secondary | ICD-10-CM | POA: Insufficient documentation

## 2017-07-14 DIAGNOSIS — Z7982 Long term (current) use of aspirin: Secondary | ICD-10-CM | POA: Diagnosis not present

## 2017-07-14 DIAGNOSIS — K625 Hemorrhage of anus and rectum: Secondary | ICD-10-CM | POA: Diagnosis present

## 2017-07-14 DIAGNOSIS — I10 Essential (primary) hypertension: Secondary | ICD-10-CM | POA: Insufficient documentation

## 2017-07-14 DIAGNOSIS — K644 Residual hemorrhoidal skin tags: Secondary | ICD-10-CM | POA: Diagnosis not present

## 2017-07-14 MED ORDER — HYDROCORTISONE 2.5 % RE CREA
TOPICAL_CREAM | Freq: Three times a day (TID) | RECTAL | Status: DC
  Filled 2017-07-14: qty 28.35

## 2017-07-14 MED ORDER — HYDROCORTISONE 2.5 % RE CREA: TOPICAL_CREAM | RECTAL | 0 refills | Status: DC

## 2017-07-14 MED ORDER — HYDROCORTISONE ACETATE 25 MG RE SUPP
25.0000 mg | Freq: Once | RECTAL | Status: DC
  Filled 2017-07-14: qty 1

## 2017-07-14 NOTE — ED Triage Notes (Signed)
Pt is c/o rectal bleeding that started Saturday night  Pt states he has been using [employer] medicated pads  Pt states it appears to be coming from 2 separate areas  States he had a bowel movement but it did not have blood in it  Pt is c/o burning to the rectal area but denies pain

## 2017-07-16 NOTE — ED Provider Notes (Signed)
[facility] DEPT Provider Note   CSN: [id number] Arrival date & time: 07/14/17  0557     History   Chief Complaint Chief Complaint  Patient presents with  . Rectal Bleeding    HPI Donald [name] is a 62 y.o. male.  HPI  53yM with rectal bleeding. Onset Saturday night. Intermittent. Has hemorrhoids and has been using hemorrhoids. Has not had blood in stool. No abdominal pain. Some rectal discomfort. No blood thinners.   Past Medical History:  Diagnosis Date  . Arthritis   . Bronchitis    HISTORY FEW MO AGO  . Colitis   . Colon polyps   . Diverticulitis   . Diverticulosis 04/07/2013  . Gout   . Hypertension   . Hyperthyroidism    PATIENT DENIES     Patient Active Problem List   Diagnosis Date Noted  . Primary osteoarthritis of right hip 08/14/2015  . Severe obesity (BMI >= 40) ([place]) 08/14/2015  . Infection due to Enterobacteriaceae   . S/P Achilles tendon repair 04/12/2015  . Achilles tendinitis of both lower extremities 06/20/2014  . Leg length difference, acquired 06/20/2014  . Hematochezia 04/07/2013  . Osteoarthritis of left knee 01/08/2013  . Allergic rhinitis, seasonal 11/04/2012  . Gout 05/11/2010  . INSOMNIA-SLEEP DISORDER-UNSPEC 01/09/2010  . Essential hypertension 12/14/2009    Past Surgical History:  Procedure Laterality Date  . ACHILLES TENDON REPAIR  08/2014   X 2  . COLONOSCOPY W/ POLYPECTOMY    . INCISION AND DRAINAGE OF WOUND Left 04/12/2015   Procedure: DEBRIDEMENT OF ACHILLES TENDON LEFT, REMOVAL OF FIBERWIRE SUTURE;  Surgeon: Dorna Leitz, MD;  Location: [facility];  Service: Orthopedics;  Laterality: Left;  . MINOR APPLICATION OF WOUND VAC  04/12/2015   Procedure:  APPLICATION OF INCISIONAL WOUND VAC;  Surgeon: Dorna Leitz, MD;  Location: [facility];  Service: Orthopedics;;  . TOTAL HIP ARTHROPLASTY Right 08/14/2015   Procedure: TOTAL HIP ARTHROPLASTY ANTERIOR APPROACH;  Surgeon: Dorna Leitz, MD;  Location: [facility];   Service: Orthopedics;  Laterality: Right;  . TOTAL KNEE ARTHROPLASTY Left 01/08/2013   Procedure: TOTAL KNEE ARTHROPLASTY;  Surgeon: Alta Corning, MD;  Location: [facility];  Service: Orthopedics;  Laterality: Left;       Home Medications    Prior to Admission medications   Medication Sig Start Date End Date Taking? Authorizing Provider  amLODipine (NORVASC) 5 MG tablet Take 1 tablet (5 mg total) by mouth daily. 10/04/16   Marin Olp, MD  aspirin EC 325 MG tablet Take 1 tablet (325 mg total) by mouth 2 (two) times daily after a meal. Take x 1 month post op to decrease risk of blood clots. 08/14/15   Gary Fleet, PA-C  diclofenac (VOLTAREN) 75 MG EC tablet Take 1 tablet (75 mg total) by mouth 2 (two) times daily. As needed for arthritis pain 12/13/16   Marin Olp, MD  hydrocortisone (ANUSOL-HC) 2.5 % rectal cream Apply rectally 2 times daily 07/14/17   Virgel Manifold, MD  losartan (COZAAR) 100 MG tablet Take 1 tablet (100 mg total) by mouth daily. 10/04/16   Marin Olp, MD  nebivolol (BYSTOLIC) 5 MG tablet Take 1 tablet (5 mg total) daily by mouth. 05/12/17   Marin Olp, MD  traZODone (DESYREL) 50 MG tablet TAKE 1/2 TO 1 TABLET BY MOUTH AT BEDTIME AS NEEDED FOR INSOMNIA 12/10/16   Marin Olp, MD    Family History Family History  Problem Relation Age of  Onset  . Emphysema Mother   . COPD Mother   . Cancer Father   . Arthritis Brother     Social History Social History   Tobacco Use  . Smoking status: Never Smoker  . Smokeless tobacco: Never Used  Substance Use Topics  . Alcohol use: Yes    Comment: light  . Drug use: No     Allergies   Bee venom; Other; and Tape   Review of Systems Review of Systems  All systems reviewed and negative, other than as noted in HPI.  Physical Exam Updated Vital Signs BP 139/74 (BP Location: Right Arm)   Pulse 67   Temp 98 F (36.7 C) (Oral)   Resp 18   SpO2 96%   Physical Exam  Constitutional: He appears  well-developed and well-nourished. No distress.  HENT:  Head: Normocephalic and atraumatic.  Eyes: Conjunctivae are normal. Right eye exhibits no discharge. Left eye exhibits no discharge.  Neck: Neck supple.  Cardiovascular: Normal rate, regular rhythm and normal heart sounds. Exam reveals no gallop and no friction rub.  No murmur heard. Pulmonary/Chest: Effort normal and breath sounds normal. No respiratory distress.  Abdominal: Soft. He exhibits no distension. There is no tenderness.  Genitourinary:  Genitourinary Comments: Nonthrombosed external hemorrhoid.  Small amount of blood noted around it but no significant active bleeding.  Musculoskeletal: He exhibits no edema or tenderness.  Neurological: He is alert.  Skin: Skin is warm and dry.  Psychiatric: He has a normal mood and affect. His behavior is normal. Thought content normal.  Nursing note and vitals reviewed.    ED Treatments / Results  Labs (all labs ordered are listed, but only abnormal results are displayed) Labs Reviewed - No data to display  EKG  EKG Interpretation None       Radiology No results found.  Procedures Procedures (including critical care time)  Medications Ordered in ED Medications - No data to display   Initial Impression / Assessment and Plan / ED Course  I have reviewed the triage vital signs and the nursing notes.  Pertinent labs & imaging results that were available during my care of the patient were reviewed by me and considered in my medical decision making (see chart for details).     61yM with non-thrombosed external hemorrhoid. Symptomatic care.   Final Clinical Impressions(s) / ED Diagnoses   Final diagnoses:  Bleeding external hemorrhoids    ED Discharge Orders        Ordered    hydrocortisone (ANUSOL-HC) 2.5 % rectal cream     07/14/17 0943       Virgel Manifold, MD 07/16/17 [id number]

## 2017-07-25 ENCOUNTER — Encounter: Payer: Self-pay | Admitting: Physician Assistant

## 2017-07-25 ENCOUNTER — Ambulatory Visit: Payer: BLUE CROSS/BLUE SHIELD | Admitting: Physician Assistant

## 2017-07-25 VITALS — BP 124/80 | HR 68 | Temp 97.6°F | Resp 17 | Ht 75.0 in | Wt 346.0 lb

## 2017-07-25 DIAGNOSIS — K640 First degree hemorrhoids: Secondary | ICD-10-CM | POA: Diagnosis not present

## 2017-07-25 MED ORDER — HYDROCORTISONE ACETATE 25 MG RE SUPP: 25.0000 mg | Freq: Two times a day (BID) | RECTAL | 0 refills | Status: DC

## 2017-07-25 NOTE — Progress Notes (Signed)
07/25/2017 10:44 AM   DOB: 15-Dec-1955 / MRN: [id number]  SUBJECTIVE:  Donald [name] is a 62 y.o. male presenting for follow-up of nontender hemorrhoid.  This is been there for about 4 days now and actually had to go to the emergency room due to some bleeding.  He has a history of colitis and he gets very concerned anytime he has rectal bleeding.  He feels well today however.  Last colonoscopy was 7 years ago with a 5-year follow-up recommendation.  Tells me he had a few polyps removed at that time and does plan to call his gastroenterologist for follow-up.  Since being treated at the ED has been using witch hazel along with steroid cream and feels that he is only gotten better.  He is allergic to bee venom; other; and tape.   He  has a past medical history of Arthritis, Bronchitis, Colitis, Colon polyps, Diverticulitis, Diverticulosis (04/07/2013), Gout, Hypertension, and Hyperthyroidism.    He  reports that  has never smoked. he has never used smokeless tobacco. He reports that he drinks alcohol. He reports that he does not use drugs. He  has no sexual activity history on file. The patient  has a past surgical history that includes Total knee arthroplasty (Left, 01/08/2013); Achilles tendon repair (08/2014); Incision and drainage of wound (Left, 04/12/2015); Minor application of wound vac (04/12/2015); Colonoscopy w/ polypectomy; and Total hip arthroplasty (Right, 08/14/2015).  His family history includes Arthritis in his brother; COPD in his mother; Cancer in his father; Emphysema in his mother.  Review of Systems  Constitutional: Negative for chills, diaphoresis and fever.  Respiratory: Negative for shortness of breath.   Cardiovascular: Negative for chest pain, orthopnea and leg swelling.  Gastrointestinal: Positive for blood in stool (Mostly on toilet paper). Negative for abdominal pain, constipation, diarrhea, heartburn, melena, nausea and vomiting.  Genitourinary: Negative for flank pain.    Skin: Negative for rash.  Neurological: Negative for dizziness.    The problem list and medications were reviewed and updated by myself where necessary and exist elsewhere in the encounter.   OBJECTIVE:  BP 124/80   Pulse 68   Temp 97.6 F (36.4 C) (Oral)   Resp 17   Ht 6\' 3"  (1.905 m)   Wt (!) 346 lb (156.9 kg)   SpO2 98%   BMI 43.25 kg/m   Physical Exam  Constitutional: He appears well-developed. He is active and cooperative.  Non-toxic appearance.  Cardiovascular: Normal rate.  Pulmonary/Chest: Effort normal and breath sounds normal. No tachypnea.  Abdominal: Bowel sounds are normal. He exhibits no distension and no mass. There is no tenderness. There is no rebound and no guarding.  Genitourinary:     Neurological: He is alert.  Skin: Skin is warm and dry. He is not diaphoretic. No pallor.  Vitals reviewed.   No results found for this or any previous visit (from the past 72 hour(s)).  No results found.  ASSESSMENT AND PLAN:  [name] was seen today for hemorrhoids.  Diagnoses and all orders for this visit:  Grade I hemorrhoids: Uncomplicated.  I have given him some Entocort in the event that it becomes worse.  He will come back if he begins having exquisite pain about the area.    The patient is advised to call or return to clinic if he does not see an improvement in symptoms, or to seek the care of the closest emergency department if he worsens with the above plan.   Philis Fendt,  MHS, PA-C [facility] 07/25/2017 10:44 AM

## 2017-07-25 NOTE — Patient Instructions (Addendum)
Keep your June.  You have a hemorrhoid at about the 9 o'clock position.  But this appears to be openAnd draining.  Given you have no tenderness we do not have to do anything today.  I am going to prescribe you some suppositories that will help in the event that the bleeding comes back.  If you begin to have significant pain about the area make sure that you come back quickly okay.   Think some fiber in your diet.  A good way to do this is to start daily  Metamucil therapy.  This can actually prevent hemorrhoids.    IF you received an x-ray today, you will receive an invoice from [facility]. Please contact [facility] at [phone] with questions or concerns regarding your invoice.   IF you received labwork today, you will receive an invoice from [facility]. Please contact [facility] at [phone] with questions or concerns regarding your invoice.   Our billing staff will not be able to assist you with questions regarding bills from these companies.  You will be contacted with the lab results as soon as they are available. The fastest way to get your results is to activate your My Chart account. Instructions are located on the last page of this paperwork. If you have not heard from [place] regarding the results in 2 weeks, please contact this office.

## 2017-08-14 ENCOUNTER — Other Ambulatory Visit: Payer: Self-pay | Admitting: Family Medicine

## 2017-08-14 NOTE — Telephone Encounter (Signed)
MEDICATION: nebivolol (BYSTOLIC) 5 MG tablet  PHARMACY:   [id number] [place], [place] [address] (Phone) [phone] (Fax)     IS THIS A 90 DAY SUPPLY : yes  IS PATIENT OUT OF MEDICATION:   IF NOT; HOW MUCH IS LEFT:   LAST APPOINTMENT DATE: @06 /08/18  NEXT APPOINTMENT DATE:@Visit  date not found  OTHER COMMENTS:    **Let patient know to contact pharmacy at the end of the day to make sure medication is ready. **  ** Please notify patient to allow 48-72 hours to process**  **Encourage patient to contact the pharmacy for refills or they can request refills through [facility]**

## 2017-08-15 MED ORDER — NEBIVOLOL HCL 5 MG PO TABS: 5.0000 mg | ORAL_TABLET | Freq: Every day | ORAL | 0 refills | Status: DC

## 2017-08-15 NOTE — Telephone Encounter (Signed)
Rx sent to pharmacy   

## 2017-08-27 ENCOUNTER — Other Ambulatory Visit: Payer: Self-pay

## 2017-08-27 MED ORDER — TRAZODONE HCL 50 MG PO TABS: ORAL_TABLET | ORAL | 1 refills | Status: DC

## 2017-09-15 ENCOUNTER — Telehealth: Payer: Self-pay | Admitting: Family Medicine

## 2017-09-15 NOTE — Telephone Encounter (Signed)
Copied from [place]. Topic: Quick Communication - Rx Refill/Question >> Sep 15, 2017  9:00 AM Margot Ables wrote: Medication: pt received letter from [facility] that more info is needed to continue covering Bystolic for BP. Pt states this med still costs him $50 per month. Pt has enough Bystolic until about 12/07/77. He is wanting to try something else if possible that would be less expensive  Has the patient contacted their pharmacy? No. Preferred Pharmacy (with phone number or street name): [id number] [place], [place] [phone] (Phone) [phone] (Fax)

## 2017-09-15 NOTE — Telephone Encounter (Signed)
Called and spoke with patient who is bringing by the paperwork from [place]. He is going to schedule a follow up appointment with Dr. Yong Channel to discuss BP meds

## 2017-09-15 NOTE — Telephone Encounter (Signed)
See note

## 2017-09-18 ENCOUNTER — Other Ambulatory Visit: Payer: Self-pay

## 2017-09-18 MED ORDER — AMLODIPINE BESYLATE 5 MG PO TABS: 5.0000 mg | ORAL_TABLET | Freq: Every day | ORAL | 3 refills | Status: DC

## 2017-10-03 ENCOUNTER — Telehealth: Payer: Self-pay

## 2017-10-03 ENCOUNTER — Other Ambulatory Visit: Payer: Self-pay

## 2017-10-03 MED ORDER — METOPROLOL SUCCINATE ER 25 MG PO TB24: 25.0000 mg | ORAL_TABLET | Freq: Every day | ORAL | 3 refills | Status: DC

## 2017-10-03 NOTE — Telephone Encounter (Signed)
Copied from [place]. Topic: Quick Communication - Office Called Patient >> Oct 03, 2017  4:13 PM [place], [place], [place] wrote: Reason for CRM: Called patient and left a voicemail message asking him to return my call. I sent in Toprol XL as we received a letter from his insurance company that as of 10/06/2017 they will no longer cover his Bystolic. >> Oct 03, 2498  4:25 PM Marin Olp L wrote: Returning call from [place] per the message she left.

## 2017-10-24 ENCOUNTER — Encounter: Payer: Self-pay | Admitting: Family Medicine

## 2017-10-24 ENCOUNTER — Ambulatory Visit: Payer: BLUE CROSS/BLUE SHIELD | Admitting: Family Medicine

## 2017-10-24 VITALS — BP 122/82 | HR 75 | Temp 97.8°F | Ht 75.0 in | Wt 332.8 lb

## 2017-10-24 DIAGNOSIS — I10 Essential (primary) hypertension: Secondary | ICD-10-CM | POA: Diagnosis not present

## 2017-10-24 DIAGNOSIS — M1712 Unilateral primary osteoarthritis, left knee: Secondary | ICD-10-CM | POA: Diagnosis not present

## 2017-10-24 DIAGNOSIS — Z1211 Encounter for screening for malignant neoplasm of colon: Secondary | ICD-10-CM

## 2017-10-24 DIAGNOSIS — M1000 Idiopathic gout, unspecified site: Secondary | ICD-10-CM

## 2017-10-24 LAB — CBC
HEMATOCRIT: 41.8 % (ref 39.0–52.0)
HEMOGLOBIN: 14.3 g/dL (ref 13.0–17.0)
MCHC: 34.3 g/dL (ref 30.0–36.0)
MCV: 90.6 fl (ref 78.0–100.0)
Platelets: 293 10*3/uL (ref 150.0–400.0)
RBC: 4.61 Mil/uL (ref 4.22–5.81)
RDW: 13.1 % (ref 11.5–15.5)
WBC: 9.5 10*3/uL (ref 4.0–10.5)

## 2017-10-24 LAB — COMPREHENSIVE METABOLIC PANEL
ALBUMIN: 4.4 g/dL (ref 3.5–5.2)
ALK PHOS: 48 U/L (ref 39–117)
ALT: 37 U/L (ref 0–53)
AST: 24 U/L (ref 0–37)
BUN: 12 mg/dL (ref 6–23)
CALCIUM: 9.5 mg/dL (ref 8.4–10.5)
CHLORIDE: 104 meq/L (ref 96–112)
CO2: 28 mEq/L (ref 19–32)
Creatinine, Ser: 0.97 mg/dL (ref 0.40–1.50)
GFR: 83.65 mL/min (ref >60.00)
Glucose, Bld: 89 mg/dL (ref 70–99)
POTASSIUM: 4.9 meq/L (ref 3.5–5.1)
Sodium: 141 mEq/L (ref 135–145)
Total Bilirubin: 0.5 mg/dL (ref 0.2–1.2)
Total Protein: 7.1 g/dL (ref 6.0–8.3)

## 2017-10-24 LAB — LIPID PANEL
CHOLESTEROL: 165 mg/dL (ref 0–200)
HDL: 52.3 mg/dL (ref >39.00)
LDL Cholesterol: 90 mg/dL (ref 0–99)
NonHDL: 112.86
TRIGLYCERIDES: 115 mg/dL (ref 0.0–149.0)
Total CHOL/HDL Ratio: 3
VLDL: 23 mg/dL (ref 0.0–40.0)

## 2017-10-24 LAB — URIC ACID: Uric Acid, Serum: 6.4 mg/dL (ref 4.0–7.8)

## 2017-10-24 NOTE — Assessment & Plan Note (Addendum)
S: controlled on amlodipine 5mg , losartan 100mg , metoprolol 25mg  xr BP Readings from Last 3 Encounters:  10/24/17 122/82  07/25/17 124/80  07/14/17 139/74  A/P: We discussed blood pressure goal of <140/90. Continue current meds.  We will update labs including lipids since he is fasting.  Fortunately lipids have been well controlled in the past

## 2017-10-24 NOTE — Progress Notes (Signed)
Subjective:  Donald [name] is a 62 y.o. year old very pleasant male patient who presents for/with See problem oriented charting ROS- low back pain going into left leg- working with orthopedics. No chest pain or shortness of breath. No headache or blurry vision.   Past Medical History-  Patient Active Problem List   Diagnosis Date Noted  . Gout 05/11/2010    Priority: Medium  . Essential hypertension 12/14/2009    Priority: Medium  . Infection due to Enterobacteriaceae     Priority: Low  . S/P Achilles tendon repair 04/12/2015    Priority: Low  . Achilles tendinitis of both lower extremities 06/20/2014    Priority: Low  . Leg length difference, acquired 06/20/2014    Priority: Low  . Osteoarthritis of left knee 01/08/2013    Priority: Low  . Allergic rhinitis, seasonal 11/04/2012    Priority: Low  . INSOMNIA-SLEEP DISORDER-UNSPEC 01/09/2010    Priority: Low  . Primary osteoarthritis of right hip 08/14/2015  . Morbid obesity ([place]) 08/14/2015  . Hematochezia 04/07/2013    Medications- reviewed and updated Current Outpatient Medications  Medication Sig Dispense Refill  . amLODipine (NORVASC) 5 MG tablet Take 1 tablet (5 mg total) by mouth daily. 90 tablet 3  . aspirin EC 325 MG tablet Take 1 tablet (325 mg total) by mouth 2 (two) times daily after a meal. Take x 1 month post op to decrease risk of blood clots. 60 tablet 0  . diclofenac (VOLTAREN) 75 MG EC tablet Take 1 tablet (75 mg total) by mouth 2 (two) times daily. As needed for arthritis pain 180 tablet 1  . hydrocortisone (ANUSOL-HC) 2.5 % rectal cream Apply rectally 2 times daily 28.35 g 0  . hydrocortisone (ANUSOL-HC) 25 MG suppository Place 1 suppository (25 mg total) rectally 2 (two) times daily. 12 suppository 0  . losartan (COZAAR) 100 MG tablet Take 1 tablet (100 mg total) by mouth daily. 90 tablet 3  . methocarbamol (ROBAXIN) 750 MG tablet TAKE 1 TABLET BY MOUTH EVERY 8 HOURS AS NEEDED FOR SPASM  0  . metoprolol  succinate (TOPROL XL) 25 MG 24 hr tablet Take 1 tablet (25 mg total) by mouth daily. 90 tablet 3  . traZODone (DESYREL) 50 MG tablet TAKE 1/2 TO 1 TABLET BY MOUTH AT BEDTIME AS NEEDED FOR INSOMNIA 90 tablet 1   Objective: BP 122/82 (BP Location: Left Arm, Patient Position: Sitting, Cuff Size: Large)   Pulse 75   Temp 97.8 F (36.6 C) (Oral)   Ht 6\' 3"  (1.905 m)   Wt (!) 332 lb 12.8 oz (151 kg)   SpO2 94%   BMI 41.60 kg/m  Gen: NAD, resting comfortably CV: RRR no murmurs rubs or gallops Lungs: CTAB no crackles, wheeze, rhonchi Abdomen: soft/nontender/nondistended/normal bowel sounds.  Morbidly obese Ext: no edema Skin: warm, dry Neuro: Normal speech  Assessment/Plan:  Essential hypertension S: controlled on amlodipine 5mg , losartan 100mg , metoprolol 25mg  xr BP Readings from Last 3 Encounters:  10/24/17 122/82  07/25/17 124/80  07/14/17 139/74  A/P: We discussed blood pressure goal of <140/90. Continue current meds.  We will update labs including lipids since he is fasting.  Fortunately lipids have been well controlled in the past  Gout S: 0  flares in last 6 months on no medicine- he had taken himself off allopurinol. Does use diclofenac on days that arthritis flares up A/P: Fortunately no flares.  We will get a uric acid level.  May need to restart allopurinol in  the future if starts to have more flares.  Osteoarthritis of left knee Continues to use diclofenac for his arthritis in his knee.  We will get a creatinine to monitor his kidney function given NSAID use  Morbid obesity [facility]) Patient states he is not interested in losing weight.  He really enjoys eating and does not want a moderate at this time.  Attempted to encourage him in regards to his long-term health risks.  Did not schedule planned follow up. Will recommend 6 months by mychart.   Lab/Order associations: Essential hypertension - Plan: CBC, Comprehensive metabolic panel, Lipid panel  Idiopathic gout,  unspecified chronicity, unspecified site - Plan: Uric acid  Screen for colon cancer - Plan: Ambulatory referral to Gastroenterology  Return precautions advised.  Garret Reddish, MD

## 2017-10-24 NOTE — Patient Instructions (Addendum)
Consider shingrix at later visit- new shingles shot  We will call you within a week or two about your referral for colonoscopy. If you do not hear within 3 weeks, give [place] a call.   Please stop by lab before you go

## 2017-10-24 NOTE — Assessment & Plan Note (Signed)
Patient states he is not interested in losing weight.  He really enjoys eating and does not want a moderate at this time.  Attempted to encourage him in regards to his long-term health risks.

## 2017-10-24 NOTE — Assessment & Plan Note (Signed)
Continues to use diclofenac for his arthritis in his knee.  We will get a creatinine to monitor his kidney function given NSAID use

## 2017-10-24 NOTE — Progress Notes (Signed)
Your CBC was normal (blood counts, infection fighting cells, platelets).It has normalized since your last set of labs after surgery. Your CMET was normal (kidney, liver, and electrolytes, blood sugar).  Your cholesterol has increased significantly with bad cholesterol from 65 to now 90.  Your total cholesterol has increased by over 30 points.  I would recommend healthy eating and regular exercise as we discussed. Your uric acid was  slightly elevated above 6.  If you have future gout flares we may need to consider medication again.

## 2017-10-24 NOTE — Assessment & Plan Note (Signed)
S: 0  flares in last 6 months on no medicine- he had taken himself off allopurinol. Does use diclofenac on days that arthritis flares up A/P: Fortunately no flares.  We will get a uric acid level.  May need to restart allopurinol in the future if starts to have more flares.

## 2017-11-05 ENCOUNTER — Other Ambulatory Visit: Payer: Self-pay | Admitting: Family Medicine

## 2017-11-10 ENCOUNTER — Other Ambulatory Visit: Payer: Self-pay

## 2017-11-10 MED ORDER — LOSARTAN POTASSIUM 100 MG PO TABS: 100.0000 mg | ORAL_TABLET | Freq: Every day | ORAL | 3 refills | Status: DC

## 2017-11-12 ENCOUNTER — Other Ambulatory Visit: Payer: Self-pay

## 2017-11-12 MED ORDER — DICLOFENAC SODIUM 75 MG PO TBEC: 75.0000 mg | DELAYED_RELEASE_TABLET | Freq: Two times a day (BID) | ORAL | 1 refills | Status: DC

## 2017-11-20 ENCOUNTER — Other Ambulatory Visit: Payer: Self-pay | Admitting: Family Medicine

## 2017-12-26 ENCOUNTER — Encounter: Payer: Self-pay | Admitting: Family Medicine

## 2018-01-28 ENCOUNTER — Other Ambulatory Visit: Payer: Self-pay | Admitting: Orthopaedic Surgery

## 2018-01-28 DIAGNOSIS — M545 Low back pain, unspecified: Secondary | ICD-10-CM

## 2018-02-01 ENCOUNTER — Ambulatory Visit
Admission: RE | Admit: 2018-02-01 | Discharge: 2018-02-01 | Disposition: A | Discharge status: Home / Self Care | Payer: Self-pay | Source: Ambulatory Visit | Attending: Orthopaedic Surgery | Admitting: Orthopaedic Surgery

## 2018-02-01 DIAGNOSIS — M545 Low back pain, unspecified: Secondary | ICD-10-CM

## 2018-03-07 ENCOUNTER — Other Ambulatory Visit: Payer: Self-pay | Admitting: Family Medicine

## 2018-05-09 ENCOUNTER — Other Ambulatory Visit: Payer: Self-pay | Admitting: Family Medicine

## 2018-07-14 ENCOUNTER — Other Ambulatory Visit: Payer: Self-pay | Admitting: Orthopedic Surgery

## 2018-07-20 NOTE — Pre-Procedure Instructions (Signed)
Donald [name]  07/20/2018      [id number] [place] [address], [place] [address] Phone: [phone] Fax: [phone]    Your procedure is scheduled on January 23rd.  Report to [facility] Admitting at 1:30 P.M.  Call this number if you have problems the morning of surgery:  [phone]   Remember:  Do not eat or drink after midnight.     Take these medicines the morning of surgery with A SIP OF WATER  Tylenol - if needed  Metoprolol   7 days prior to surgery STOP taking any Aspirin (unless otherwise instructed by your surgeon), Aleve, Naproxen, Ibuprofen, Motrin, Advil, Goody's, BC's, all herbal medications, fish oil, and all vitamins.    Do not wear jewelry.  Do not wear lotions, powders, colognes, or deodorant.  Men may shave face and neck.  Do not bring valuables to the hospital.  [facility] is not responsible for any belongings or valuables.   [facility]- Preparing For Surgery  Before surgery, you can play an important role. Because skin is not sterile, your skin needs to be as free of germs as possible. You can reduce the number of germs on your skin by washing with CHG (chlorahexidine gluconate) Soap before surgery.  CHG is an antiseptic cleaner which kills germs and bonds with the skin to continue killing germs even after washing.    Oral Hygiene is also important to reduce your risk of infection.  Remember - BRUSH YOUR TEETH THE MORNING OF SURGERY WITH YOUR REGULAR TOOTHPASTE  Please do not use if you have an allergy to CHG or antibacterial soaps. If your skin becomes reddened/irritated stop using the CHG.  Do not shave (including legs and underarms) for at least 48 hours prior to first CHG shower. It is OK to shave your face.  Please follow these instructions carefully.   1. Shower the NIGHT BEFORE SURGERY and the MORNING OF SURGERY with CHG.   2. If you chose to wash your hair, wash your hair first as  usual with your normal shampoo.  3. After you shampoo, rinse your hair and body thoroughly to remove the shampoo.  4. Use CHG as you would any other liquid soap. You can apply CHG directly to the skin and wash gently with a scrungie or a clean washcloth.   5. Apply the CHG Soap to your body ONLY FROM THE NECK DOWN.  Do not use on open wounds or open sores. Avoid contact with your eyes, ears, mouth and genitals (private parts). Wash Face and genitals (private parts)  with your normal soap.  6. Wash thoroughly, paying special attention to the area where your surgery will be performed.  7. Thoroughly rinse your body with warm water from the neck down.  8. DO NOT shower/wash with your normal soap after using and rinsing off the CHG Soap.  9. Pat yourself dry with a CLEAN TOWEL.  10. Wear CLEAN PAJAMAS to bed the night before surgery, wear comfortable clothes the morning of surgery  11. Place CLEAN SHEETS on your bed the night of your first shower and DO NOT SLEEP WITH PETS.   Day of Surgery:  Do not apply any deodorants/lotions.  Please wear clean clothes to the hospital/surgery center.   Remember to brush your teeth WITH YOUR REGULAR TOOTHPASTE.   Contacts, dentures or bridgework may not be worn into surgery.  Leave your suitcase in the car.  After surgery it may be brought to your room.  For patients admitted to the hospital, discharge time will be determined by your treatment team.  Patients discharged the day of surgery will not be allowed to drive home.   Please read over the following fact sheets that you were given. Coughing and Deep Breathing, MRSA Information and Surgical Site Infection Prevention

## 2018-07-21 ENCOUNTER — Other Ambulatory Visit: Payer: Self-pay

## 2018-07-21 ENCOUNTER — Encounter (HOSPITAL_COMMUNITY)
Admission: RE | Admit: 2018-07-21 | Discharge: 2018-07-21 | Disposition: A | Discharge status: Home / Self Care | Payer: No Typology Code available for payment source | Source: Ambulatory Visit | Attending: Orthopedic Surgery | Admitting: Orthopedic Surgery

## 2018-07-21 ENCOUNTER — Encounter (HOSPITAL_COMMUNITY): Payer: Self-pay

## 2018-07-21 DIAGNOSIS — Z91030 Bee allergy status: Secondary | ICD-10-CM | POA: Diagnosis not present

## 2018-07-21 DIAGNOSIS — Z91048 Other nonmedicinal substance allergy status: Secondary | ICD-10-CM | POA: Diagnosis not present

## 2018-07-21 DIAGNOSIS — M1990 Unspecified osteoarthritis, unspecified site: Secondary | ICD-10-CM | POA: Diagnosis not present

## 2018-07-21 DIAGNOSIS — R9431 Abnormal electrocardiogram [ECG] [EKG]: Secondary | ICD-10-CM

## 2018-07-21 DIAGNOSIS — Z96652 Presence of left artificial knee joint: Secondary | ICD-10-CM | POA: Diagnosis not present

## 2018-07-21 DIAGNOSIS — M48062 Spinal stenosis, lumbar region with neurogenic claudication: Secondary | ICD-10-CM | POA: Diagnosis present

## 2018-07-21 DIAGNOSIS — I10 Essential (primary) hypertension: Secondary | ICD-10-CM | POA: Insufficient documentation

## 2018-07-21 DIAGNOSIS — Z6841 Body Mass Index (BMI) 40.0 and over, adult: Secondary | ICD-10-CM | POA: Diagnosis not present

## 2018-07-21 DIAGNOSIS — Z96641 Presence of right artificial hip joint: Secondary | ICD-10-CM | POA: Diagnosis not present

## 2018-07-21 DIAGNOSIS — Z79899 Other long term (current) drug therapy: Secondary | ICD-10-CM | POA: Diagnosis not present

## 2018-07-21 DIAGNOSIS — Z01818 Encounter for other preprocedural examination: Secondary | ICD-10-CM

## 2018-07-21 LAB — CBC WITH DIFFERENTIAL/PLATELET
Abs Immature Granulocytes: 0.02 10*3/uL (ref 0.00–0.07)
Basophils Absolute: 0.1 10*3/uL (ref 0.0–0.1)
Basophils Relative: 1 %
Eosinophils Absolute: 0.2 10*3/uL (ref 0.0–0.5)
Eosinophils Relative: 2 %
HEMATOCRIT: 41.2 % (ref 39.0–52.0)
Hemoglobin: 13.2 g/dL (ref 13.0–17.0)
Immature Granulocytes: 0 %
Lymphocytes Relative: 33 %
Lymphs Abs: 2.5 10*3/uL (ref 0.7–4.0)
MCH: 29.8 pg (ref 26.0–34.0)
MCHC: 32 g/dL (ref 30.0–36.0)
MCV: 93 fL (ref 80.0–100.0)
Monocytes Absolute: 0.7 10*3/uL (ref 0.1–1.0)
Monocytes Relative: 10 %
Neutro Abs: 4 10*3/uL (ref 1.7–7.7)
Neutrophils Relative %: 54 %
Platelets: 307 10*3/uL (ref 150–400)
RBC: 4.43 MIL/uL (ref 4.22–5.81)
RDW: 12.7 % (ref 11.5–15.5)
WBC: 7.4 10*3/uL (ref 4.0–10.5)
nRBC: 0 % (ref 0.0–0.2)

## 2018-07-21 LAB — URINALYSIS, ROUTINE W REFLEX MICROSCOPIC
Bilirubin Urine: NEGATIVE
Glucose, UA: NEGATIVE mg/dL
Hgb urine dipstick: NEGATIVE
Ketones, ur: NEGATIVE mg/dL
Leukocytes, UA: NEGATIVE
Nitrite: NEGATIVE
Protein, ur: NEGATIVE mg/dL
SPECIFIC GRAVITY, URINE: 1.003 — AB (ref 1.005–1.030)
pH: 5 (ref 5.0–8.0)

## 2018-07-21 LAB — COMPREHENSIVE METABOLIC PANEL
ALT: 47 U/L — ABNORMAL HIGH (ref 0–44)
AST: 36 U/L (ref 15–41)
Albumin: 4.2 g/dL (ref 3.5–5.0)
Alkaline Phosphatase: 39 U/L (ref 38–126)
Anion gap: 10 (ref 5–15)
BUN: 13 mg/dL (ref 8–23)
CO2: 20 mmol/L — ABNORMAL LOW (ref 22–32)
Calcium: 9.2 mg/dL (ref 8.9–10.3)
Chloride: 110 mmol/L (ref 98–111)
Creatinine, Ser: 0.91 mg/dL (ref 0.61–1.24)
GFR calc Af Amer: >60 mL/min (ref >60)
GFR calc non Af Amer: >60 mL/min (ref >60)
Glucose, Bld: 86 mg/dL (ref 70–99)
POTASSIUM: 4.2 mmol/L (ref 3.5–5.1)
Sodium: 140 mmol/L (ref 135–145)
TOTAL PROTEIN: 7.2 g/dL (ref 6.5–8.1)
Total Bilirubin: 0.4 mg/dL (ref 0.3–1.2)

## 2018-07-21 LAB — SURGICAL PCR SCREEN
MRSA, PCR: NEGATIVE
Staphylococcus aureus: NEGATIVE

## 2018-07-21 LAB — TYPE AND SCREEN
ABO/RH(D): B POS
ANTIBODY SCREEN: NEGATIVE

## 2018-07-21 LAB — PROTIME-INR
INR: 1.09
Prothrombin Time: 14 seconds (ref 11.4–15.2)

## 2018-07-21 LAB — APTT: aPTT: 30 seconds (ref 24–36)

## 2018-07-21 NOTE — Progress Notes (Signed)
PCP: Garret Reddish  DM: denies  SA: denies  Pt denies SOB, cough, fever, chest pain @ PAT appt  Pt stated understanding of instructions given for DOS.

## 2018-07-22 MED ORDER — DEXTROSE 5 % IV SOLN
3.0000 g | INTRAVENOUS | Status: AC
  Administered 2018-07-23 (×2): 3 g via INTRAVENOUS
  Filled 2018-07-22: qty 3

## 2018-07-22 NOTE — Progress Notes (Signed)
Anesthesia Chart Review:  Case:  [id number] Date/Time:  07/23/18 1611   Procedure:  LUMBAR LAMINECTOMY/DECOMPRESSION MICRODISCECTOMY 3 LEVELS; Lumbar 2-3, Lumbar 3-4, Lumbar 4-5 (N/A )   Anesthesia type:  General   Pre-op diagnosis:  MULTILEVEL SPINAL STENOSIS RESULTING IN SYMPTOMS CONSISTENT WITH NEUROGENIC CLAUDICATION   Location:  [facility] OR ROOM 05 / [facility]   Surgeon:  Phylliss Bob, MD      DISCUSSION: Patient is a 63 year old male scheduled for the above procedure.  History includes never smoker, HTN. BMI is consistent with morbid obesity.   EKG appears stable. He denied SOB, cough, fever, and chest pain at PAT RN visit. If no acute changes then I would anticipate that he can proceed as planned.   VS: BP (!) 144/80   Pulse 71   Temp 36.5 C   Resp 20   Ht 6\' 3"  (1.905 m)   Wt (!) 159.3 kg   SpO2 99%   BMI 43.91 kg/m   PROVIDERS: Marin Olp, MD is PCP   LABS: Labs reviewed: Acceptable for surgery. (all labs ordered are listed, but only abnormal results are displayed)  Labs Reviewed  COMPREHENSIVE METABOLIC PANEL - Abnormal; Notable for the following components:      Result Value   CO2 20 (*)    ALT 47 (*)    All other components within normal limits  URINALYSIS, ROUTINE W REFLEX MICROSCOPIC - Abnormal; Notable for the following components:   Color, Urine STRAW (*)    Specific Gravity, Urine 1.003 (*)    All other components within normal limits  SURGICAL PCR SCREEN  APTT  CBC WITH DIFFERENTIAL/PLATELET  PROTIME-INR  TYPE AND SCREEN    IMAGES: MRI L-spine 02/01/18: IMPRESSION: Degenerative disc disease and degenerative facet disease throughout the lumbar region. Multifactorial stenosis at L2-3 and L3-4 with potential for neural compression, more on the left at L2-3 and more on the right at L3-4. At L4-5, there is shallow protrusion of the disc, spondylosis and facet arthropathy with lateral recess and foraminal narrowing. Foraminal narrowing is worse on the  right which could affect the right L4 nerve.   EKG: 07/21/18: NSR. LAD. Cannot rule out inferior infarct, age undetermined. Interpreting cardiologist did not think EKG was significantly changed when compared to tracing from 04/07/15.   CV: N/A   Past Medical History:  Diagnosis Date  . Arthritis   . Bronchitis    HISTORY FEW MO AGO  . Colitis   . Colon polyps   . Diverticulitis   . Diverticulosis 04/07/2013  . Gout   . Hypertension     Past Surgical History:  Procedure Laterality Date  . ACHILLES TENDON REPAIR  08/2014   X 2  . COLONOSCOPY W/ POLYPECTOMY    . INCISION AND DRAINAGE OF WOUND Left 04/12/2015   Procedure: DEBRIDEMENT OF ACHILLES TENDON LEFT, REMOVAL OF FIBERWIRE SUTURE;  Surgeon: Dorna Leitz, MD;  Location: [facility];  Service: Orthopedics;  Laterality: Left;  . MINOR APPLICATION OF WOUND VAC  04/12/2015   Procedure:  APPLICATION OF INCISIONAL WOUND VAC;  Surgeon: Dorna Leitz, MD;  Location: [facility];  Service: Orthopedics;;  . TOTAL HIP ARTHROPLASTY Right 08/14/2015   Procedure: TOTAL HIP ARTHROPLASTY ANTERIOR APPROACH;  Surgeon: Dorna Leitz, MD;  Location: [facility];  Service: Orthopedics;  Laterality: Right;  . TOTAL KNEE ARTHROPLASTY Left 01/08/2013   Procedure: TOTAL KNEE ARTHROPLASTY;  Surgeon: Alta Corning, MD;  Location: [facility];  Service: Orthopedics;  Laterality: Left;  MEDICATIONS: . acetaminophen (TYLENOL) 500 MG tablet  . amLODipine (NORVASC) 5 MG tablet  . diclofenac (VOLTAREN) 75 MG EC tablet  . losartan (COZAAR) 100 MG tablet  . metoprolol succinate (TOPROL XL) 25 MG 24 hr tablet  . traZODone (DESYREL) 50 MG tablet   No current facility-administered medications for this encounter.    Derrill Memo ON 07/23/2018] ceFAZolin (ANCEF) 3 g in dextrose 5 % 50 mL IVPB    Myra Gianotti, PA-C Surgical Short Stay/Anesthesiology [facility] Phone [phone] [facility] Phone [phone] 07/22/2018 8:34 PM

## 2018-07-22 NOTE — Anesthesia Preprocedure Evaluation (Addendum)
Anesthesia Evaluation  Patient identified by MRN, date of birth, ID band Patient awake    Reviewed: Allergy & Precautions, H&P , NPO status , Patient's Chart, lab work & pertinent test results, reviewed documented beta blocker date and time   Airway Mallampati: II  TM Distance: >3 FB Neck ROM: Full    Dental no notable dental hx. (+) Teeth Intact, Dental Advisory Given   Pulmonary neg pulmonary ROS,    Pulmonary exam normal breath sounds clear to auscultation       Cardiovascular hypertension, Pt. on medications and Pt. on home beta blockers  Rhythm:Regular Rate:Normal     Neuro/Psych negative neurological ROS  negative psych ROS   GI/Hepatic negative GI ROS, Neg liver ROS,   Endo/Other  Morbid obesity  Renal/GU negative Renal ROS  negative genitourinary   Musculoskeletal  (+) Arthritis , Osteoarthritis,    Abdominal   Peds  Hematology negative hematology ROS (+)   Anesthesia Other Findings   Reproductive/Obstetrics negative OB ROS                           Anesthesia Physical Anesthesia Plan  ASA: III  Anesthesia Plan: General   Post-op Pain Management:    Induction: Intravenous  PONV Risk Score and Plan: 3 and Ondansetron, Dexamethasone and Midazolam  Airway Management Planned: Oral ETT  Additional Equipment:   Intra-op Plan:   Post-operative Plan: Extubation in OR  Informed Consent: I have reviewed the patients History and Physical, chart, labs and discussed the procedure including the risks, benefits and alternatives for the proposed anesthesia with the patient or authorized representative who has indicated his/her understanding and acceptance.     Dental advisory given  Plan Discussed with: CRNA  Anesthesia Plan Comments: (PAT note written 07/22/2018 by Myra Gianotti, PA-C. )       Anesthesia Quick Evaluation

## 2018-07-23 ENCOUNTER — Ambulatory Visit (HOSPITAL_COMMUNITY): Payer: No Typology Code available for payment source

## 2018-07-23 ENCOUNTER — Ambulatory Visit (HOSPITAL_COMMUNITY): Payer: No Typology Code available for payment source | Admitting: Certified Registered"

## 2018-07-23 ENCOUNTER — Encounter (HOSPITAL_COMMUNITY): Payer: Self-pay | Admitting: *Deleted

## 2018-07-23 ENCOUNTER — Ambulatory Visit (HOSPITAL_COMMUNITY): Payer: No Typology Code available for payment source | Admitting: Vascular Surgery

## 2018-07-23 ENCOUNTER — Other Ambulatory Visit: Payer: Self-pay

## 2018-07-23 ENCOUNTER — Encounter (HOSPITAL_COMMUNITY)
Admission: RE | Disposition: A | Discharge status: Home / Self Care | Payer: Self-pay | Source: Home / Self Care | Attending: Orthopedic Surgery

## 2018-07-23 ENCOUNTER — Ambulatory Visit (HOSPITAL_COMMUNITY)
Admission: RE | Admit: 2018-07-23 | Discharge: 2018-07-23 | Disposition: A | Discharge status: Home / Self Care | Payer: No Typology Code available for payment source | Attending: Orthopedic Surgery | Admitting: Orthopedic Surgery

## 2018-07-23 DIAGNOSIS — M48062 Spinal stenosis, lumbar region with neurogenic claudication: Secondary | ICD-10-CM | POA: Diagnosis not present

## 2018-07-23 DIAGNOSIS — M1990 Unspecified osteoarthritis, unspecified site: Secondary | ICD-10-CM | POA: Insufficient documentation

## 2018-07-23 DIAGNOSIS — Z91048 Other nonmedicinal substance allergy status: Secondary | ICD-10-CM | POA: Insufficient documentation

## 2018-07-23 DIAGNOSIS — Z96652 Presence of left artificial knee joint: Secondary | ICD-10-CM | POA: Insufficient documentation

## 2018-07-23 DIAGNOSIS — Z6841 Body Mass Index (BMI) 40.0 and over, adult: Secondary | ICD-10-CM | POA: Insufficient documentation

## 2018-07-23 DIAGNOSIS — I10 Essential (primary) hypertension: Secondary | ICD-10-CM | POA: Insufficient documentation

## 2018-07-23 DIAGNOSIS — Z419 Encounter for procedure for purposes other than remedying health state, unspecified: Secondary | ICD-10-CM

## 2018-07-23 DIAGNOSIS — Z79899 Other long term (current) drug therapy: Secondary | ICD-10-CM | POA: Insufficient documentation

## 2018-07-23 DIAGNOSIS — Z96641 Presence of right artificial hip joint: Secondary | ICD-10-CM | POA: Insufficient documentation

## 2018-07-23 DIAGNOSIS — Z91030 Bee allergy status: Secondary | ICD-10-CM | POA: Insufficient documentation

## 2018-07-23 HISTORY — PX: LUMBAR LAMINECTOMY/DECOMPRESSION MICRODISCECTOMY: SHX5026

## 2018-07-23 SURGERY — LUMBAR LAMINECTOMY/DECOMPRESSION MICRODISCECTOMY 3 LEVELS
Anesthesia: General | Site: Back

## 2018-07-23 MED ORDER — METHYLPREDNISOLONE ACETATE 40 MG/ML IJ SUSP
INTRAMUSCULAR | Status: DC | PRN
  Administered 2018-07-23 (×2): 40 mg

## 2018-07-23 MED ORDER — HEMOSTATIC AGENTS (NO CHARGE) OPTIME
TOPICAL | Status: DC | PRN
  Administered 2018-07-23: 1

## 2018-07-23 MED ORDER — ROCURONIUM BROMIDE 50 MG/5ML IV SOSY
PREFILLED_SYRINGE | INTRAVENOUS | Status: AC
  Filled 2018-07-23: qty 5

## 2018-07-23 MED ORDER — FENTANYL CITRATE (PF) 250 MCG/5ML IJ SOLN
INTRAMUSCULAR | Status: AC
  Filled 2018-07-23: qty 5

## 2018-07-23 MED ORDER — ROCURONIUM BROMIDE 50 MG/5ML IV SOSY
PREFILLED_SYRINGE | INTRAVENOUS | Status: AC
  Filled 2018-07-23: qty 10

## 2018-07-23 MED ORDER — GLYCOPYRROLATE 0.2 MG/ML IJ SOLN
INTRAMUSCULAR | Status: DC | PRN
  Administered 2018-07-23: 0.2 mg via INTRAVENOUS

## 2018-07-23 MED ORDER — BUPIVACAINE LIPOSOME 1.3 % IJ SUSP
20.0000 mL | INTRAMUSCULAR | Status: DC
  Filled 2018-07-23: qty 20

## 2018-07-23 MED ORDER — HYDROMORPHONE HCL 1 MG/ML IJ SOLN: 0.2500 mg | INTRAMUSCULAR | Status: DC | PRN

## 2018-07-23 MED ORDER — METHYLPREDNISOLONE ACETATE 40 MG/ML IJ SUSP
INTRAMUSCULAR | Status: AC
  Filled 2018-07-23: qty 1

## 2018-07-23 MED ORDER — LIDOCAINE 2% (20 MG/ML) 5 ML SYRINGE
INTRAMUSCULAR | Status: DC | PRN
  Administered 2018-07-23: 60 mg via INTRAVENOUS

## 2018-07-23 MED ORDER — SODIUM CHLORIDE 0.9 % IV SOLN
INTRAVENOUS | Status: DC | PRN
  Administered 2018-07-23: 20 ug/min via INTRAVENOUS
  Administered 2018-07-23 (×2): via INTRAVENOUS

## 2018-07-23 MED ORDER — PHENYLEPHRINE 40 MCG/ML (10ML) SYRINGE FOR IV PUSH (FOR BLOOD PRESSURE SUPPORT)
PREFILLED_SYRINGE | INTRAVENOUS | Status: AC
  Filled 2018-07-23: qty 10

## 2018-07-23 MED ORDER — SUGAMMADEX SODIUM 500 MG/5ML IV SOLN
INTRAVENOUS | Status: AC
  Filled 2018-07-23: qty 5

## 2018-07-23 MED ORDER — EPHEDRINE SULFATE 50 MG/ML IJ SOLN
INTRAMUSCULAR | Status: DC | PRN
  Administered 2018-07-23 (×4): 10 mg via INTRAVENOUS

## 2018-07-23 MED ORDER — CEFAZOLIN SODIUM 1 G IJ SOLR
INTRAMUSCULAR | Status: AC
  Filled 2018-07-23: qty 30

## 2018-07-23 MED ORDER — FENTANYL CITRATE (PF) 100 MCG/2ML IJ SOLN
INTRAMUSCULAR | Status: DC | PRN
  Administered 2018-07-23: 150 ug via INTRAVENOUS
  Administered 2018-07-23 (×2): 50 ug via INTRAVENOUS

## 2018-07-23 MED ORDER — DEXAMETHASONE SODIUM PHOSPHATE 10 MG/ML IJ SOLN
INTRAMUSCULAR | Status: DC | PRN
  Administered 2018-07-23: 10 mg via INTRAVENOUS

## 2018-07-23 MED ORDER — BUPIVACAINE-EPINEPHRINE 0.25% -1:200000 IJ SOLN
INTRAMUSCULAR | Status: AC
  Filled 2018-07-23: qty 1

## 2018-07-23 MED ORDER — PHENYLEPHRINE HCL 10 MG/ML IJ SOLN
INTRAMUSCULAR | Status: AC
  Filled 2018-07-23: qty 1

## 2018-07-23 MED ORDER — POVIDONE-IODINE 7.5 % EX SOLN
Freq: Once | CUTANEOUS | Status: DC
  Filled 2018-07-23: qty 118

## 2018-07-23 MED ORDER — ROCURONIUM BROMIDE 50 MG/5ML IV SOSY
PREFILLED_SYRINGE | INTRAVENOUS | Status: DC | PRN
  Administered 2018-07-23: 20 mg via INTRAVENOUS
  Administered 2018-07-23 (×3): 50 mg via INTRAVENOUS
  Administered 2018-07-23 (×2): 25 mg via INTRAVENOUS
  Administered 2018-07-23: 30 mg via INTRAVENOUS

## 2018-07-23 MED ORDER — DEXAMETHASONE SODIUM PHOSPHATE 10 MG/ML IJ SOLN
INTRAMUSCULAR | Status: AC
  Filled 2018-07-23: qty 1

## 2018-07-23 MED ORDER — ONDANSETRON HCL 4 MG/2ML IJ SOLN
INTRAMUSCULAR | Status: DC | PRN
  Administered 2018-07-23: 4 mg via INTRAVENOUS

## 2018-07-23 MED ORDER — PROPOFOL 10 MG/ML IV BOLUS
INTRAVENOUS | Status: AC
  Filled 2018-07-23: qty 20

## 2018-07-23 MED ORDER — LACTATED RINGERS IV SOLN
INTRAVENOUS | Status: DC
  Administered 2018-07-23 (×3): via INTRAVENOUS

## 2018-07-23 MED ORDER — BUPIVACAINE-EPINEPHRINE 0.25% -1:200000 IJ SOLN
INTRAMUSCULAR | Status: DC | PRN
  Administered 2018-07-23: 10 mL

## 2018-07-23 MED ORDER — THROMBIN 20000 UNITS EX SOLR
CUTANEOUS | Status: DC | PRN
  Administered 2018-07-23: 20 mL

## 2018-07-23 MED ORDER — GLYCOPYRROLATE PF 0.2 MG/ML IJ SOSY
PREFILLED_SYRINGE | INTRAMUSCULAR | Status: AC
  Filled 2018-07-23: qty 1

## 2018-07-23 MED ORDER — THROMBIN 20000 UNITS EX SOLR
CUTANEOUS | Status: AC
  Filled 2018-07-23: qty 20000

## 2018-07-23 MED ORDER — ALBUMIN HUMAN 5 % IV SOLN
INTRAVENOUS | Status: DC | PRN
  Administered 2018-07-23: 14:00:00 via INTRAVENOUS

## 2018-07-23 MED ORDER — SODIUM CHLORIDE 0.9 % IV SOLN
INTRAVENOUS | Status: DC | PRN
  Administered 2018-07-23: 16:00:00 via INTRAVENOUS

## 2018-07-23 MED ORDER — ONDANSETRON HCL 4 MG/2ML IJ SOLN
INTRAMUSCULAR | Status: AC
  Filled 2018-07-23: qty 2

## 2018-07-23 MED ORDER — SUCCINYLCHOLINE CHLORIDE 200 MG/10ML IV SOSY
PREFILLED_SYRINGE | INTRAVENOUS | Status: AC
  Filled 2018-07-23: qty 10

## 2018-07-23 MED ORDER — PHENYLEPHRINE 40 MCG/ML (10ML) SYRINGE FOR IV PUSH (FOR BLOOD PRESSURE SUPPORT)
PREFILLED_SYRINGE | INTRAVENOUS | Status: DC | PRN
  Administered 2018-07-23 (×5): 40 ug via INTRAVENOUS

## 2018-07-23 MED ORDER — 0.9 % SODIUM CHLORIDE (POUR BTL) OPTIME
TOPICAL | Status: DC | PRN
  Administered 2018-07-23 (×4): 1000 mL

## 2018-07-23 MED ORDER — MIDAZOLAM HCL 5 MG/5ML IJ SOLN
INTRAMUSCULAR | Status: DC | PRN
  Administered 2018-07-23: 2 mg via INTRAVENOUS

## 2018-07-23 MED ORDER — METHYLENE BLUE 0.5 % INJ SOLN
INTRAVENOUS | Status: DC | PRN
  Administered 2018-07-23: 1 mL

## 2018-07-23 MED ORDER — ACETAMINOPHEN 500 MG PO TABS
1000.0000 mg | ORAL_TABLET | Freq: Once | ORAL | Status: AC
  Administered 2018-07-23: 1000 mg via ORAL
  Filled 2018-07-23: qty 2

## 2018-07-23 MED ORDER — BUPIVACAINE LIPOSOME 1.3 % IJ SUSP
INTRAMUSCULAR | Status: DC | PRN
  Administered 2018-07-23: 20 mL

## 2018-07-23 MED ORDER — LIDOCAINE 2% (20 MG/ML) 5 ML SYRINGE
INTRAMUSCULAR | Status: AC
  Filled 2018-07-23: qty 5

## 2018-07-23 MED ORDER — PROPOFOL 10 MG/ML IV BOLUS
INTRAVENOUS | Status: DC | PRN
  Administered 2018-07-23: 200 mg via INTRAVENOUS

## 2018-07-23 MED ORDER — SUGAMMADEX SODIUM 500 MG/5ML IV SOLN
INTRAVENOUS | Status: DC | PRN
  Administered 2018-07-23: 400 mg via INTRAVENOUS

## 2018-07-23 MED ORDER — MIDAZOLAM HCL 2 MG/2ML IJ SOLN
INTRAMUSCULAR | Status: AC
  Filled 2018-07-23: qty 2

## 2018-07-23 MED ORDER — METHYLENE BLUE 0.5 % INJ SOLN
INTRAVENOUS | Status: AC
  Filled 2018-07-23: qty 10

## 2018-07-23 SURGICAL SUPPLY — 69 items
BUR PRECISION FLUTE 5.0 (BURR) ×3 IMPLANT
CABLE BIPOLOR RESECTION CORD (MISCELLANEOUS) ×3 IMPLANT
CANISTER SUCT 3000ML PPV (MISCELLANEOUS) ×3 IMPLANT
CARTRIDGE OIL MAESTRO DRILL (MISCELLANEOUS) ×1 IMPLANT
COVER SURGICAL LIGHT HANDLE (MISCELLANEOUS) ×3 IMPLANT
COVER WAND RF STERILE (DRAPES) ×3 IMPLANT
DECANTER SPIKE VIAL GLASS SM (MISCELLANEOUS) ×3 IMPLANT
DIFFUSER DRILL AIR PNEUMATIC (MISCELLANEOUS) ×3 IMPLANT
DRAIN CHANNEL 15F RND FF W/TCR (WOUND CARE) ×3 IMPLANT
DRAPE POUCH INSTRU U-SHP 10X18 (DRAPES) ×3 IMPLANT
DRAPE SURG 17X23 STRL (DRAPES) ×12 IMPLANT
DURAPREP 26ML APPLICATOR (WOUND CARE) ×3 IMPLANT
ELECT BLADE 4.0 EZ CLEAN MEGAD (MISCELLANEOUS) ×3
ELECT CAUTERY BLADE 6.4 (BLADE) ×3 IMPLANT
ELECT REM PT RETURN 9FT ADLT (ELECTROSURGICAL) ×3
ELECTRODE BLDE 4.0 EZ CLN MEGD (MISCELLANEOUS) ×1 IMPLANT
ELECTRODE REM PT RTRN 9FT ADLT (ELECTROSURGICAL) ×1 IMPLANT
EVACUATOR SILICONE 100CC (DRAIN) ×3 IMPLANT
FILTER STRAW FLUID ASPIR (MISCELLANEOUS) ×3 IMPLANT
GAUZE 4X4 16PLY RFD (DISPOSABLE) ×6 IMPLANT
GAUZE SPONGE 4X4 12PLY STRL (GAUZE/BANDAGES/DRESSINGS) ×3 IMPLANT
GLOVE BIO SURGEON STRL SZ7 (GLOVE) ×3 IMPLANT
GLOVE BIO SURGEON STRL SZ8 (GLOVE) ×3 IMPLANT
GLOVE BIOGEL PI IND STRL 7.0 (GLOVE) ×1 IMPLANT
GLOVE BIOGEL PI IND STRL 8 (GLOVE) ×1 IMPLANT
GLOVE BIOGEL PI INDICATOR 7.0 (GLOVE) ×2
GLOVE BIOGEL PI INDICATOR 8 (GLOVE) ×2
GOWN STRL REUS W/ TWL LRG LVL3 (GOWN DISPOSABLE) ×2 IMPLANT
GOWN STRL REUS W/ TWL XL LVL3 (GOWN DISPOSABLE) ×3 IMPLANT
GOWN STRL REUS W/TWL LRG LVL3 (GOWN DISPOSABLE) ×4
GOWN STRL REUS W/TWL XL LVL3 (GOWN DISPOSABLE) ×6
IV CATH 14GX2 1/4 (CATHETERS) ×3 IMPLANT
KIT BASIN OR (CUSTOM PROCEDURE TRAY) ×3 IMPLANT
KIT POSITION SURG JACKSON T1 (MISCELLANEOUS) ×3 IMPLANT
KIT TURNOVER KIT B (KITS) ×3 IMPLANT
NEEDLE 18GX1X1/2 (RX/OR ONLY) (NEEDLE) ×3 IMPLANT
NEEDLE 22X1 1/2 (OR ONLY) (NEEDLE) ×3 IMPLANT
NEEDLE HYPO 25GX1X1/2 BEV (NEEDLE) ×6 IMPLANT
NEEDLE SPNL 18GX3.5 QUINCKE PK (NEEDLE) ×6 IMPLANT
NS IRRIG 1000ML POUR BTL (IV SOLUTION) ×12 IMPLANT
OIL CARTRIDGE MAESTRO DRILL (MISCELLANEOUS) ×3
PACK LAMINECTOMY ORTHO (CUSTOM PROCEDURE TRAY) ×3 IMPLANT
PACK UNIVERSAL I (CUSTOM PROCEDURE TRAY) ×3 IMPLANT
PAD ARMBOARD 7.5X6 YLW CONV (MISCELLANEOUS) ×9 IMPLANT
PATTIES SURGICAL .5 X.5 (GAUZE/BANDAGES/DRESSINGS) ×3 IMPLANT
PATTIES SURGICAL .5 X1 (DISPOSABLE) ×3 IMPLANT
SPONGE INTESTINAL PEANUT (DISPOSABLE) ×6 IMPLANT
SPONGE LAP 4X18 RFD (DISPOSABLE) ×9 IMPLANT
SPONGE SURGIFOAM ABS GEL SZ50 (HEMOSTASIS) ×3 IMPLANT
SURGIFLO W/THROMBIN 8M KIT (HEMOSTASIS) ×3 IMPLANT
SUT BONE WAX W31G (SUTURE) ×9 IMPLANT
SUT ETHILON 2 0 FS 18 (SUTURE) ×6 IMPLANT
SUT MNCRL AB 4-0 PS2 18 (SUTURE) ×3 IMPLANT
SUT VIC AB 0 CT1 18XCR BRD 8 (SUTURE) ×1 IMPLANT
SUT VIC AB 0 CT1 27 (SUTURE)
SUT VIC AB 0 CT1 27XBRD ANBCTR (SUTURE) IMPLANT
SUT VIC AB 0 CT1 8-18 (SUTURE) ×2
SUT VIC AB 1 CT1 18XCR BRD 8 (SUTURE) ×2 IMPLANT
SUT VIC AB 1 CT1 8-18 (SUTURE) ×4
SUT VIC AB 2-0 CT2 18 VCP726D (SUTURE) ×3 IMPLANT
SYR 20CC LL (SYRINGE) ×3 IMPLANT
SYR 3ML LL SCALE MARK (SYRINGE) ×6 IMPLANT
SYR BULB IRRIGATION 50ML (SYRINGE) ×3 IMPLANT
SYR CONTROL 10ML LL (SYRINGE) ×6 IMPLANT
SYR TB 1ML 26GX3/8 SAFETY (SYRINGE) ×6 IMPLANT
SYR TB 1ML LUER SLIP (SYRINGE) ×6 IMPLANT
TOWEL GREEN STERILE (TOWEL DISPOSABLE) ×3 IMPLANT
TOWEL GREEN STERILE FF (TOWEL DISPOSABLE) ×3 IMPLANT
YANKAUER SUCT BULB TIP NO VENT (SUCTIONS) ×3 IMPLANT

## 2018-07-23 NOTE — Anesthesia Procedure Notes (Signed)
Procedure Name: Intubation Date/Time: 07/23/2018 1:13 PM Performed by: Lance Coon, CRNA Pre-anesthesia Checklist: Patient identified, Emergency Drugs available, Suction available, Patient being monitored and Timeout performed Patient Re-evaluated:Patient Re-evaluated prior to induction Oxygen Delivery Method: Circle system utilized Preoxygenation: Pre-oxygenation with 100% oxygen Induction Type: IV induction Ventilation: Mask ventilation without difficulty and Oral airway inserted - appropriate to patient size Laryngoscope Size: 3, Mac and 2 Grade View: Grade II Tube type: Oral Tube size: 7.5 mm Number of attempts: 1 Airway Equipment and Method: Stylet Placement Confirmation: ETT inserted through vocal cords under direct vision,  positive ETCO2 and breath sounds checked- equal and bilateral Secured at: 23 cm Tube secured with: Tape Dental Injury: Teeth and Oropharynx as per pre-operative assessment  Comments: Airway per Darylene Price SRNA

## 2018-07-23 NOTE — Transfer of Care (Signed)
Immediate Anesthesia Transfer of Care Note  Patient: CHUKWUMA STRAUS  Procedure(s) Performed: LUMBAR LAMINECTOMY/DECOMPRESSION MICRODISCECTOMY 3 LEVELS; Lumbar 2-3, Lumbar 3-4, Lumbar 4-5 (N/A Back)  Patient Location: PACU  Anesthesia Type:General  Level of Consciousness: awake and alert   Airway & Oxygen Therapy: Patient Spontanous Breathing and Patient connected to nasal cannula oxygen  Post-op Assessment: Report given to RN, Post -op Vital signs reviewed and stable and Patient moving all extremities  Post vital signs: Reviewed and stable  Last Vitals:  Vitals Value Taken Time  BP 169/100 07/23/2018  7:00 PM  Temp 36.8 C 07/23/2018  7:00 PM  Pulse 116 07/23/2018  7:04 PM  Resp 16 07/23/2018  7:04 PM  SpO2 97 % 07/23/2018  7:04 PM  Vitals shown include unvalidated device data.  Last Pain:  Vitals:   07/23/18 1900  TempSrc:   PainSc: 0-No pain         Complications: No apparent anesthesia complications

## 2018-07-23 NOTE — Anesthesia Postprocedure Evaluation (Signed)
Anesthesia Post Note  Patient: Donald [name]  Procedure(s) Performed: LUMBAR LAMINECTOMY/DECOMPRESSION MICRODISCECTOMY 3 LEVELS; Lumbar 2-3, Lumbar 3-4, Lumbar 4-5 (N/A Back)     Patient location during evaluation: PACU Anesthesia Type: General Level of consciousness: awake and alert Pain management: pain level controlled Vital Signs Assessment: post-procedure vital signs reviewed and stable Respiratory status: spontaneous breathing, nonlabored ventilation and respiratory function stable Cardiovascular status: blood pressure returned to baseline and stable Postop Assessment: no apparent nausea or vomiting Anesthetic complications: no    Last Vitals:  Vitals:   07/23/18 1945 07/23/18 2013  BP: 129/67 107/77  Pulse: (!) 108 (!) 117  Resp: 17 19  Temp: 36.8 C 36.8 C  SpO2: 96% 96%    Last Pain:  Vitals:   07/23/18 1945  TempSrc:   PainSc: [name]

## 2018-07-23 NOTE — H&P (Signed)
PREOPERATIVE H&P  Chief Complaint: Bilateral leg pain  HPI: Donald [name] is a 63 y.o. male who presents with ongoing pain in the bilateral legs  MRI reveals spinal stenosis spanning L3-L5  Patient has failed multiple forms of conservative care and continues to have pain (see office notes for additional details regarding the patient's full course of treatment)  Past Medical History:  Diagnosis Date  . Arthritis   . Bronchitis    HISTORY FEW MO AGO  . Colitis   . Colon polyps   . Diverticulitis   . Diverticulosis 04/07/2013  . Gout   . Hypertension    Past Surgical History:  Procedure Laterality Date  . ACHILLES TENDON REPAIR  08/2014   X 2  . COLONOSCOPY W/ POLYPECTOMY    . INCISION AND DRAINAGE OF WOUND Left 04/12/2015   Procedure: DEBRIDEMENT OF ACHILLES TENDON LEFT, REMOVAL OF FIBERWIRE SUTURE;  Surgeon: Dorna Leitz, MD;  Location: [facility];  Service: Orthopedics;  Laterality: Left;  . MINOR APPLICATION OF WOUND VAC  04/12/2015   Procedure:  APPLICATION OF INCISIONAL WOUND VAC;  Surgeon: Dorna Leitz, MD;  Location: [facility];  Service: Orthopedics;;  . TOTAL HIP ARTHROPLASTY Right 08/14/2015   Procedure: TOTAL HIP ARTHROPLASTY ANTERIOR APPROACH;  Surgeon: Dorna Leitz, MD;  Location: [facility];  Service: Orthopedics;  Laterality: Right;  . TOTAL KNEE ARTHROPLASTY Left 01/08/2013   Procedure: TOTAL KNEE ARTHROPLASTY;  Surgeon: Alta Corning, MD;  Location: [facility];  Service: Orthopedics;  Laterality: Left;   Social History   Socioeconomic History  . Marital status: Single    Spouse name: Not on file  . Number of children: Not on file  . Years of education: Not on file  . Highest education level: Not on file  Occupational History  . Occupation: Building services engineer: [employer]  . Financial resource strain: Not on file  . Food insecurity:    Worry: Not on file    Inability: Not on file  . Transportation needs:    Medical: Not on file   Non-medical: Not on file  Tobacco Use  . Smoking status: Never Smoker  . Smokeless tobacco: Never Used  Substance and Sexual Activity  . Alcohol use: Yes    Comment: light  . Drug use: No  . Sexual activity: Not on file  Lifestyle  . Physical activity:    Days per week: Not on file    Minutes per session: Not on file  . Stress: Not on file  Relationships  . Social connections:    Talks on phone: Not on file    Gets together: Not on file    Attends religious service: Not on file    Active member of club or organization: Not on file    Attends meetings of clubs or organizations: Not on file    Relationship status: Not on file  Other Topics Concern  . Not on file  Social History Narrative  . Not on file   Family History  Problem Relation Age of Onset  . Emphysema Mother   . COPD Mother   . Cancer Father   . Arthritis Brother    Allergies  Allergen Reactions  . Other Other (See Comments)    Kevlar stitching, infection   . Bee Venom     UNSPECIFIED REACTION   . Tape Rash    White strips of tape, infection   Prior to Admission medications  Medication Sig Start Date End Date Taking? Authorizing Provider  acetaminophen (TYLENOL) 500 MG tablet Take 1,000 mg by mouth every 6 (six) hours as needed for moderate pain.   Yes [provider]  amLODipine (NORVASC) 5 MG tablet Take 1 tablet (5 mg total) by mouth daily. 09/18/17  Yes Marin Olp, MD  diclofenac (VOLTAREN) 75 MG EC tablet TAKE 1 TABLET BY MOUTH TWICE A DAY AS NEEDED FOR ARTHRITIS PAIN Patient taking differently: Take 75 mg by mouth 2 (two) times daily as needed for moderate pain.  05/11/18  Yes Marin Olp, MD  losartan (COZAAR) 100 MG tablet Take 1 tablet (100 mg total) by mouth daily. 11/10/17  Yes Marin Olp, MD  metoprolol succinate (TOPROL XL) 25 MG 24 hr tablet Take 1 tablet (25 mg total) by mouth daily. 10/03/17  Yes Marin Olp, MD  traZODone (DESYREL) 50 MG tablet TAKE 1/2 TO 1  TABLET BY MOUTH AT BEDTIME AS NEEDED FOR INSOMNIA Patient taking differently: Take 50 mg by mouth at bedtime as needed for sleep. TAKE 1/2 TO 1 TABLET BY MOUTH AT BEDTIME AS NEEDED FOR INSOMNIA 03/09/18  Yes Marin Olp, MD     All other systems have been reviewed and were otherwise negative with the exception of those mentioned in the HPI and as above.  Physical Exam: There were no vitals filed for this visit.  There is no height or weight on file to calculate BMI.  General: Alert, no acute distress Cardiovascular: No pedal edema Respiratory: No cyanosis, no use of accessory musculature Skin: No lesions in the area of chief complaint Neurologic: Sensation intact distally Psychiatric: Patient is competent for consent with normal mood and affect Lymphatic: No axillary or cervical lymphadenopathy   Assessment/Plan: MULTILEVEL SPINAL STENOSIS RESULTING IN NEUROGENIC CLAUDICATION Plan for Procedure(s): LUMBAR LAMINECTOMY/DECOMPRESSION MICRODISCECTOMY; Lumbar 2-3, Lumbar 3-4, Lumbar 4-5   Norva Karvonen, MD 07/23/2018 6:41 AM

## 2018-07-23 NOTE — Op Note (Signed)
NAME:  Donald [name]            MEDICAL RECORD NO.:  [id number]            PHYSICIAN:  Phylliss Bob, MD      DATE OF BIRTH:  Jan 11, 1956            DATE OF PROCEDURE:  07/23/2018                                         OPERATIVE REPORT   PREOPERATIVE DIAGNOSES: 1. Bilateral leg pain. 2. Neurogenic claudication. 3. Spinal stenosis, L2/3, L3/4, L4/5.  POSTOPERATIVE DIAGNOSES: 1. Bilateral leg pain. 2. Neurogenic claudication. 3. Spinal stenosis, L2/3, L3/4, L4/5.  PROCEDURE:  L2/3, L3/4, L4/5 laminectomy with bilateral partial facetectomy and bilateral foraminotomy.  SURGEON:  Phylliss Bob, MD.  ASSISTANTPricilla Holm, PA-C.  ANESTHESIA:  General endotracheal anesthesia.  COMPLICATIONS:  None.  DISPOSITION:  Stable.  ESTIMATED BLOOD LOSS:  Minimal.  INDICATIONS FOR SURGERY:  Briefly, Mr. Springborn is a very pleasant 63 year-old male, who did present to me with pain in the bilateral legs. The patient's MRI did reveal spinal stenosis at L2/3, L3/4 and L4/5.  We did proceed with appropriate conservative treatment, but the patient did continue to have ongoing pain, which he did feel was limiting his function substantially.  Given the patient's ongoing pain and dysfunction, we did discuss proceeding with the procedure reflected above.  The patient was fully aware of the risks and limitations of surgery and did wish to proceed.  OPERATIVE DETAILS:  On 1//23/2020, the patient was brought to surgery and general endotracheal anesthesia was administered.  The patient was placed prone on a well-padded flat Jackson bed with a spinal frame. Antibiotics were given and the back was prepped and draped in the usual sterile fashion.  A time-out procedure was performed.  I then made a midline incision overlying the L2/3, L3/4 and L4/5 intervertebral spaces.  The fascia was incised at the midline.  The paraspinal musculature was bluntly retracted laterally and held retracted with a  self-retaining retractor. After confirming the appropriate operative level, I did remove the spinous process of L2, L3 and L4.  At this point, I proceeded with a partial facetectomy on the right and left sides at L4/5.  Of note, there was  significant overgrowth of the facet joints bilaterally, and there was also substantial hypertrophy of the ligamentum flavum.  The lateral recess stenosis was addressed by using Kerrison punches to thoroughly and decompress the right and left lateral recess. Of note, there were substantial adhesions in the region of the right lateral recess, which slightly limited my ability to palpate the right L5 pedicle. I was however happy with the  Decompression. At this point, I carried the decompression up to the L3-4 level.  Once again, a partial facetectomy was performed bilaterally, and I was able to thoroughly and completely decompress the right and left lateral recess at the L3-4 level.  I then carried the decompression up to the L2/3 level.  Once again, a partial facetectomy was performed bilaterally, and I was able to thoroughly and completely decompress the right and left lateral recess at L2/3. Of note, there was prominent epidural lipomatosis noted at L2/3 and L3/4.  I then performed foraminotomies bilaterally at L2/3, L3/4, and then L4/5.  At the termination of the decompression, I was able to  easily pass a Surveyor, quantity out the neuroforamen on the right and left sides at L2/3, L3/4 and L4/5.  The spinal canal was entirely decompressed.  All bleeding was then adequately controlled.  At this point, 40 mg of Depo-Medrol was introduced about the epidural space.  Prior to this, the wound was copiously irrigated with a total of approximately 2 L of normal saline. Gelfoam was placed over the laminectomy site.  I was very pleased with the decompression.  There was no extravasation of cerebrospinal fluid noted throughout the entire surgery.  A #15 deep blake drain was  placed.  At this point, the wound was closed in layers using #1 Vicryl, followed by 2-0 Vicryl, followed by 2-0 Nylon and bacitracin (patient has a steri strip allergy). A sterile dressing was then applied.  All instrument counts were correct at the termination of the procedure.  Of note, Pricilla Holm, PA-C, was my assistant throughout surgery, and did aid in retraction, suctioning, and closure from start to finish.     Phylliss Bob, MD

## 2018-07-24 ENCOUNTER — Encounter (HOSPITAL_COMMUNITY): Payer: Self-pay | Admitting: Orthopedic Surgery

## 2018-07-28 MED FILL — Sodium Chloride IV Soln 0.9%: INTRAVENOUS | Qty: 2000 | Status: AC

## 2018-07-28 MED FILL — Heparin Sodium (Porcine) Inj 1000 Unit/ML: INTRAMUSCULAR | Qty: 30 | Status: AC

## 2018-08-15 ENCOUNTER — Other Ambulatory Visit: Payer: Self-pay | Admitting: Family Medicine

## 2018-09-22 ENCOUNTER — Other Ambulatory Visit: Payer: Self-pay | Admitting: Family Medicine

## 2018-10-28 ENCOUNTER — Other Ambulatory Visit: Payer: Self-pay | Admitting: Family Medicine

## 2018-10-29 ENCOUNTER — Encounter: Payer: Self-pay | Admitting: Family Medicine

## 2018-10-29 ENCOUNTER — Ambulatory Visit (INDEPENDENT_AMBULATORY_CARE_PROVIDER_SITE_OTHER): Payer: PRIVATE HEALTH INSURANCE | Admitting: Family Medicine

## 2018-10-29 VITALS — BP 154/88 | Temp 97.0°F | Ht 75.0 in | Wt 335.0 lb

## 2018-10-29 DIAGNOSIS — I10 Essential (primary) hypertension: Secondary | ICD-10-CM

## 2018-10-29 DIAGNOSIS — M1A00X0 Idiopathic chronic gout, unspecified site, without tophus (tophi): Secondary | ICD-10-CM | POA: Diagnosis not present

## 2018-10-29 DIAGNOSIS — G4700 Insomnia, unspecified: Secondary | ICD-10-CM

## 2018-10-29 MED ORDER — METOPROLOL SUCCINATE ER 25 MG PO TB24: 25.0000 mg | ORAL_TABLET | Freq: Every day | ORAL | 3 refills | Status: DC

## 2018-10-29 MED ORDER — AMLODIPINE BESYLATE 5 MG PO TABS: 5.0000 mg | ORAL_TABLET | Freq: Every day | ORAL | 3 refills | Status: DC

## 2018-10-29 MED ORDER — LOSARTAN POTASSIUM 100 MG PO TABS: 100.0000 mg | ORAL_TABLET | Freq: Every day | ORAL | 3 refills | Status: DC

## 2018-10-29 MED ORDER — TRAZODONE HCL 50 MG PO TABS: 50.0000 mg | ORAL_TABLET | Freq: Every evening | ORAL | 3 refills | Status: DC | PRN

## 2018-10-29 NOTE — Patient Instructions (Addendum)
There are no preventive care reminders to display for this patient.  Depression screen [facility] 2/9 10/29/2018 07/25/2017 10/04/2016  Decreased Interest 0 0 0  Down, Depressed, Hopeless 0 0 0  PHQ - 2 Score 0 0 0   Video visit

## 2018-10-29 NOTE — Progress Notes (Signed)
Phone [phone]   Subjective:  Virtual visit via Video note. Chief complaint: Chief Complaint  Patient presents with  . Follow-up  . Medication Refill    This visit type was conducted due to national recommendations for restrictions regarding the COVID-19 Pandemic (e.g. social distancing).  This format is felt to be most appropriate for this patient at this time balancing risks to patient and risks to population by having him in for in person visit.  No physical exam was performed (except for noted visual exam or audio findings with Telehealth visits).    Our team/I connected with Donald [name] on 10/29/18 at  1:40 PM EDT by a video enabled telemedicine application ([email or web address]) and verified that I am speaking with the correct person using two identifiers.  Location patient: [facility] Location provider: [facility], office Persons participating in the virtual visit:  patient  Our team/I discussed the limitations of evaluation and management by telemedicine and the availability of in person appointments. In light of current covid-19 pandemic, patient also understands that we are trying to protect them by minimizing in office contact if at all possible.  The patient expressed consent for telemedicine visit and agreed to proceed. Patient understands insurance will be billed.   ROS- continued back pain, heel issues if walking a lot. Insomnia but does better on trazodone. Knee pain noted. No fever or chills reported.    Past Medical History-  Patient Active Problem List   Diagnosis Date Noted  . Gout 05/11/2010    Priority: Medium  . Essential hypertension 12/14/2009    Priority: Medium  . Infection due to Enterobacteriaceae     Priority: Low  . S/P Achilles tendon repair 04/12/2015    Priority: Low  . Achilles tendinitis of both lower extremities 06/20/2014    Priority: Low  . Leg length difference, acquired 06/20/2014    Priority: Low  . Osteoarthritis of left knee 01/08/2013   Priority: Low  . Allergic rhinitis, seasonal 11/04/2012    Priority: Low  . INSOMNIA-SLEEP DISORDER-UNSPEC 01/09/2010    Priority: Low  . Primary osteoarthritis of right hip 08/14/2015  . Morbid obesity ([place]) 08/14/2015  . Hematochezia 04/07/2013    Medications- reviewed and updated Current Outpatient Medications  Medication Sig Dispense Refill  . acetaminophen (TYLENOL) 500 MG tablet Take 1,000 mg by mouth every 6 (six) hours as needed for moderate pain.    Marland Kitchen amLODipine (NORVASC) 5 MG tablet Take 1 tablet (5 mg total) by mouth daily. 90 tablet 3  . losartan (COZAAR) 100 MG tablet Take 1 tablet (100 mg total) by mouth daily. 90 tablet 3  . metoprolol succinate (TOPROL-XL) 25 MG 24 hr tablet Take 1 tablet (25 mg total) by mouth daily. 90 tablet 3  . traZODone (DESYREL) 50 MG tablet Take 1 tablet (50 mg total) by mouth at bedtime as needed for sleep. TAKE 1/2 TO 1 TABLET BY MOUTH AT BEDTIME AS NEEDED FOR INSOMNIA 90 tablet 3   No current facility-administered medications for this visit.      Objective:  BP (!) 154/88 (BP Location: Left Arm, Patient Position: Sitting, Cuff Size: Large)   Temp (!) 97 F (36.1 C) (Oral)   Ht 6\' 3"  (1.905 m)   Wt (!) 335 lb (152 kg)   BMI 41.87 kg/m  Gen: NAD, resting comfortably Lungs: nonlabored, normal respiratory rate  Skin: appears dry, no obvious rash Normal speech    Assessment and Plan   #hypertension S: poorly controlled on losartan 100mg , amlodipine  5 mg, metoprolol 25 mg XR. Patient is taking diclofenac daily for his knees though BP Readings from Last 3 Encounters:  10/29/18 (!) 154/88  07/23/18 107/77  07/21/18 (!) 144/80  A/P: poor control- stop diclofenac- use only for gout flares. He is going to update me early next week with his BPs.   # Gout S:fortunately no recent flare ups  Lab Results  Component Value Date   LABURIC 6.4 10/24/2017  A/P: doing well without flares- continue prn diclofenac only but should not use  regularly with BP elevations for various aches/pains- should be targetted for gout. For ortho issues- should return to orthopedics - possible injections for knees if needed?  # morbid obesity S: weight up at 335- wants to work towards 300 A/P: BMI over 40- poor control of weight. Exercise limited by back and achilles issues (history of tendon repair).  Weight los smay also benefit BP  # insomnia S:doing well with  trazodone A/P: Stable. Continue current medications. Refills provided   Other notes: 1.his insurance runs out later in may  2. Had workmans comp case related with work- also now applying for long term disability 3. Had labs in January - last lipids last year were ok so we opted to repeat labs next visit (once he gets insurance back) 4. Discussed possible goodrx for meds if needed- but he is on generic so hopefully not too costly anyway.   Next visit determined based on next insurance likely Lab/Order associations: Idiopathic chronic gout without tophus, unspecified site  Essential hypertension  Morbid obesity ([place])  Meds ordered this encounter  Medications  . amLODipine (NORVASC) 5 MG tablet    Sig: Take 1 tablet (5 mg total) by mouth daily.    Dispense:  90 tablet    Refill:  3  . losartan (COZAAR) 100 MG tablet    Sig: Take 1 tablet (100 mg total) by mouth daily.    Dispense:  90 tablet    Refill:  3  . metoprolol succinate (TOPROL-XL) 25 MG 24 hr tablet    Sig: Take 1 tablet (25 mg total) by mouth daily.    Dispense:  90 tablet    Refill:  3  . traZODone (DESYREL) 50 MG tablet    Sig: Take 1 tablet (50 mg total) by mouth at bedtime as needed for sleep. TAKE 1/2 TO 1 TABLET BY MOUTH AT BEDTIME AS NEEDED FOR INSOMNIA    Dispense:  90 tablet    Refill:  3    Return precautions advised.  Garret Reddish, MD

## 2018-11-01 ENCOUNTER — Encounter: Payer: Self-pay | Admitting: Family Medicine

## 2018-11-02 NOTE — Telephone Encounter (Signed)
Please see message . Thank you .

## 2019-09-23 ENCOUNTER — Other Ambulatory Visit: Payer: Self-pay | Admitting: Family Medicine

## 2019-10-16 ENCOUNTER — Other Ambulatory Visit: Payer: Self-pay | Admitting: Family Medicine

## 2019-11-01 ENCOUNTER — Other Ambulatory Visit: Payer: Self-pay | Admitting: Family Medicine

## 2019-12-24 ENCOUNTER — Other Ambulatory Visit: Payer: Self-pay | Admitting: Family Medicine

## 2019-12-26 ENCOUNTER — Other Ambulatory Visit: Payer: Self-pay | Admitting: Family Medicine

## 2020-02-19 ENCOUNTER — Other Ambulatory Visit: Payer: Self-pay | Admitting: Family Medicine

## 2020-03-16 ENCOUNTER — Other Ambulatory Visit: Payer: Self-pay | Admitting: Family Medicine

## 2020-03-21 ENCOUNTER — Other Ambulatory Visit: Payer: Self-pay | Admitting: Family Medicine

## 2020-04-14 ENCOUNTER — Other Ambulatory Visit: Payer: Self-pay | Admitting: Family Medicine

## 2020-06-09 ENCOUNTER — Other Ambulatory Visit: Payer: Self-pay | Admitting: Family Medicine

## 2020-08-19 ENCOUNTER — Other Ambulatory Visit: Payer: Self-pay | Admitting: Family Medicine

## 2020-09-15 ENCOUNTER — Other Ambulatory Visit: Payer: Self-pay | Admitting: Family Medicine

## 2020-10-08 ENCOUNTER — Other Ambulatory Visit: Payer: Self-pay | Admitting: Family Medicine

## 2020-10-09 NOTE — Telephone Encounter (Signed)
Schedule an appointment with Dr.Hunter for further refills.

## 2020-10-14 ENCOUNTER — Other Ambulatory Visit: Payer: Self-pay | Admitting: Family Medicine

## 2020-11-01 ENCOUNTER — Other Ambulatory Visit: Payer: Self-pay | Admitting: Family Medicine

## 2020-11-24 ENCOUNTER — Other Ambulatory Visit: Payer: Self-pay | Admitting: Family Medicine

## 2020-12-21 ENCOUNTER — Other Ambulatory Visit: Payer: Self-pay | Admitting: Family Medicine

## 2021-01-13 ENCOUNTER — Other Ambulatory Visit: Payer: Self-pay | Admitting: Family Medicine

## 2021-02-02 NOTE — Progress Notes (Signed)
Phone [phone] In person visit   Subjective:   Donald [name] is a 65 y.o. year old very pleasant male patient who presents for/with See problem oriented charting Chief Complaint  Patient presents with   Hypertension   This visit occurred during the SARS-CoV-2 public health emergency.  Safety protocols were in place, including screening questions prior to the visit, additional usage of staff PPE, and extensive cleaning of exam room while observing appropriate contact time as indicated for disinfecting solutions.   Past Medical History-  Patient Active Problem List   Diagnosis Date Noted   Morbid obesity ([place]) 08/14/2015    Priority: High   Gout 05/11/2010    Priority: Medium   INSOMNIA-SLEEP DISORDER-UNSPEC 01/09/2010    Priority: Medium   Essential hypertension 12/14/2009    Priority: Medium   Infection due to Enterobacteriaceae     Priority: Low   S/P Achilles tendon repair 04/12/2015    Priority: Low   Achilles tendinitis of both lower extremities 06/20/2014    Priority: Low   Leg length difference, acquired 06/20/2014    Priority: Low   Osteoarthritis of left knee 01/08/2013    Priority: Low   Allergic rhinitis, seasonal 11/04/2012    Priority: Low   Primary osteoarthritis of right hip 08/14/2015   Hematochezia 04/07/2013    Medications- reviewed and updated Current Outpatient Medications  Medication Sig Dispense Refill   acetaminophen (TYLENOL) 500 MG tablet Take 1,000 mg by mouth every 6 (six) hours as needed for moderate pain.     metoprolol succinate (TOPROL-XL) 50 MG 24 hr tablet Take 1 tablet (50 mg total) by mouth daily. Take with or immediately following a meal. 90 tablet 3   amLODipine (NORVASC) 5 MG tablet Take 1 tablet (5 mg total) by mouth daily. 90 tablet 3   losartan (COZAAR) 100 MG tablet Take 1 tablet (100 mg total) by mouth daily. 90 tablet 3   traZODone (DESYREL) 50 MG tablet TAKE 1 TABLET BY MOUTH EVERYDAY AT BEDTIME 90 tablet 3   No  current facility-administered medications for this visit.     Objective:  BP (!) 152/92   Pulse 77   Temp 98.3 F (36.8 C) (Temporal)   Ht '6\' 3"'$  (1.905 m)   Wt (!) 338 lb 6.4 oz (153.5 kg)   SpO2 96%   BMI 42.30 kg/m  Gen: NAD, resting comfortably CV: RRR no murmurs rubs or gallops Lungs: CTAB no crackles, wheeze, rhonchi Ext: trace edema Skin: warm, dry     Assessment and Plan   #social update- looking like will get on medicare soon with disability- he is in 3rd stage of getting this approved - he wants to hold off on labs for now- will schedule a visit as soon as gets insurance  #hypertension S: mediation:  losaratan 100 mg everyday in AM, amplodipine 5 mg daily in AM,  and metoprolol 25 mg XR everyday in AM. Patient is taking diclofenac now just prn- tylenol instead Home readings #s: 147/85 at home  BP Readings from Last 3 Encounters:  02/05/21 (!) 152/92  10/29/18 (!) 154/88  07/23/18 107/77  A/P: Blood pressure is poorly controlled-already with some edema in bilateral legs so hesitant to increase amlodipine unless we have to-we will trial increasing metoprolol to 50 mg-I want him to update me with home blood pressures in 1 month-if blood pressure greater than 135/85 we may need to make some tweaks such as changing to valsartan which may be stronger. - He  is without insurance right now so we are can try to do this by MyChart if we can - Want him to start using an upper arm cuff instead of a wrist cuff- can bring to next visit - dash eating plan advised  # morbid Obesity - BMI > 40 S: weight up slightly 3 lbs from last visit. Knees make exercise hard- one joy he has is eating- does not want to make changes- has had some success with keto Wt Readings from Last 3 Encounters:  02/05/21 (!) 338 lb 6.4 oz (153.5 kg)  10/29/18 (!) 335 lb (152 kg)  07/23/18 (!) 351 lb 4.8 oz (159.3 kg)  A/P: lower motivation but he agrees to try to work on this- may try keto. I encouraged half  plate or calorie counting- declines    # insomnia S: was doing well with trazodone 50 mg everyday at bedtime last visit- reports again today A/P: Stable. Continue current medications.    #Gout S: 0 flares in last 2 years on no uric acid lowering agent Lab Results  Component Value Date   LABURIC 6.4 10/24/2017  A/P:no recent issues- continue to monitor    # HM- declines the below until gets health insurance- other than flu shot at [facility] Health Maintenance Due  Topic Date Due   Zoster Vaccines- Shingrix (1 of 2) Never done   TETANUS/TDAP  04/02/2020   COLONOSCOPY (Pts 45-70yr Insurance coverage will need to be confirmed)  05/22/2020   INFLUENZA VACCINE  01/29/2021  - had 2nd and 3rd covid shot- team will update  Recommended follow up: Return in about 6 months (around 08/08/2021) for follow-up or sooner as needed.  Lab/Order associations:   ICD-10-CM   1. Essential hypertension  I10     2. Morbid obesity ([place]  E66.01     3. Insomnia, unspecified type  G47.00     4. Gout, unspecified cause, unspecified chronicity, unspecified site  M10.9       Meds ordered this encounter  Medications   amLODipine (NORVASC) 5 MG tablet    Sig: Take 1 tablet (5 mg total) by mouth daily.    Dispense:  90 tablet    Refill:  3   losartan (COZAAR) 100 MG tablet    Sig: Take 1 tablet (100 mg total) by mouth daily.    Dispense:  90 tablet    Refill:  3   metoprolol succinate (TOPROL-XL) 50 MG 24 hr tablet    Sig: Take 1 tablet (50 mg total) by mouth daily. Take with or immediately following a meal.    Dispense:  90 tablet    Refill:  3   traZODone (DESYREL) 50 MG tablet    Sig: TAKE 1 TABLET BY MOUTH EVERYDAY AT BEDTIME    Dispense:  90 tablet    Refill:  3     I,Jada Bradford,acting as a scribe for SGarret Reddish MD.,have documented all relevant documentation on the behalf of SGarret Reddish MD,as directed by  SGarret Reddish MD while in the presence of SGarret Reddish MD.  I, SGarret Reddish MD, have reviewed all documentation for this visit. The documentation on 02/05/21 for the exam, diagnosis, procedures, and orders are all accurate and complete.  Return precautions advised.  SGarret Reddish MD

## 2021-02-05 ENCOUNTER — Other Ambulatory Visit: Payer: Self-pay

## 2021-02-05 ENCOUNTER — Encounter: Payer: Self-pay | Admitting: Family Medicine

## 2021-02-05 ENCOUNTER — Ambulatory Visit (INDEPENDENT_AMBULATORY_CARE_PROVIDER_SITE_OTHER): Payer: Self-pay | Admitting: Family Medicine

## 2021-02-05 VITALS — BP 152/92 | HR 77 | Temp 98.3°F | Ht 75.0 in | Wt 338.4 lb

## 2021-02-05 DIAGNOSIS — I10 Essential (primary) hypertension: Secondary | ICD-10-CM

## 2021-02-05 DIAGNOSIS — G4700 Insomnia, unspecified: Secondary | ICD-10-CM

## 2021-02-05 DIAGNOSIS — M109 Gout, unspecified: Secondary | ICD-10-CM

## 2021-02-05 DIAGNOSIS — M1A00X0 Idiopathic chronic gout, unspecified site, without tophus (tophi): Secondary | ICD-10-CM

## 2021-02-05 MED ORDER — METOPROLOL SUCCINATE ER 50 MG PO TB24: 50.0000 mg | ORAL_TABLET | Freq: Every day | ORAL | 3 refills | Status: DC

## 2021-02-05 MED ORDER — LOSARTAN POTASSIUM 100 MG PO TABS: 100.0000 mg | ORAL_TABLET | Freq: Every day | ORAL | 3 refills | Status: DC

## 2021-02-05 MED ORDER — TRAZODONE HCL 50 MG PO TABS: ORAL_TABLET | ORAL | 3 refills | Status: DC

## 2021-02-05 MED ORDER — AMLODIPINE BESYLATE 5 MG PO TABS: 5.0000 mg | ORAL_TABLET | Freq: Every day | ORAL | 3 refills | Status: DC

## 2021-02-05 NOTE — Patient Instructions (Addendum)
Health Maintenance Due  Topic Date Due   Zoster Vaccines- Shingrix (1 of 2) .  - Will schedule when insurance is updated. Never done   TETANUS/TDAP.  Will schedule when insurance is updated.  04/02/2020   COLONOSCOPY (Pts 45-30yr Insurance coverage will need to be confirmed)   Will schedule when insurance is updated.  05/22/2020   INFLUENZA VACCINE Will get this at his local pharmacy.   Please consider getting your flu shot in the Fall. If you get this outside of our office, please let u[place]now.  01/29/2021   Increase metoprolol to 50 ml.Transition to take medication in the morning instead of night-time update me in one month with home blood pressures with upper arm cuff - Tomorrow (02/06/2021) take half of blood pressure pills in the morning and other half of pills at nighttime. On 02/07/21 take all meds before bed  - Team please input PMokuleiavaccination from 10/04/2019 and 04/12/2020.  Recommended follow up: Return in about 6 months (around 08/08/2021) for follow-up or sooner as needed.

## 2021-02-12 ENCOUNTER — Other Ambulatory Visit: Payer: Self-pay | Admitting: Family Medicine

## 2021-07-12 ENCOUNTER — Other Ambulatory Visit: Payer: Self-pay

## 2021-07-12 ENCOUNTER — Telehealth (INDEPENDENT_AMBULATORY_CARE_PROVIDER_SITE_OTHER): Payer: PPO | Admitting: Family

## 2021-07-12 ENCOUNTER — Encounter: Payer: Self-pay | Admitting: Family

## 2021-07-12 VITALS — Ht 75.0 in | Wt 338.4 lb

## 2021-07-12 DIAGNOSIS — U071 COVID-19: Secondary | ICD-10-CM | POA: Insufficient documentation

## 2021-07-12 MED ORDER — MOLNUPIRAVIR EUA 200MG CAPSULE: 4.0000 | ORAL_CAPSULE | Freq: Two times a day (BID) | ORAL | 0 refills | Status: AC

## 2021-07-12 NOTE — Progress Notes (Signed)
MyChart Video Visit    Virtual Visit via Video Note   This visit type was conducted due to national recommendations for restrictions regarding the COVID-19 Pandemic (e.g. social distancing) in an effort to limit this patient's exposure and mitigate transmission in our community. This patient is at least at moderate risk for complications without adequate follow up. This format is felt to be most appropriate for this patient at this time. Physical exam was limited by quality of the video and audio technology used for the visit. CMA was able to get the patient set up on a video visit.  Patient location: Home. Patient and provider in visit Provider location: Office  I discussed the limitations of evaluation and management by telemedicine and the availability of in person appointments. The patient expressed understanding and agreed to proceed.  Visit Date: 07/12/2021  Today's healthcare provider: Jeanie Sewer, NP     Subjective:    Patient ID: Donald [name], male    DOB: 12/10/1955, 66 y.o.   MRN: [id number]  Chief Complaint  Patient presents with   Covid Positive    Symptoms Started Monday. Tested positive today. 99.5 temp He has taken extra strength Tylenol. He denies sore throat,  Chest pain and SOB.   Cough   Nasal Congestion    HPI Upper Respiratory Infection: Symptoms include low grade fever and nasal congestion.  Onset of symptoms was 3 days ago, unchanged since that time. He is drinking moderate amounts of fluids. Evaluation to date: none. Took Covid test at home and was positive. Treatment to date: none.    Past Medical History:  Diagnosis Date   Arthritis    Bronchitis    HISTORY FEW MO AGO   Colitis    Colon polyps    Diverticulitis    Diverticulosis 04/07/2013   Gout    Hypertension     Past Surgical History:  Procedure Laterality Date   ACHILLES TENDON REPAIR  08/2014   X 2   COLONOSCOPY W/ POLYPECTOMY     INCISION AND DRAINAGE OF WOUND Left  04/12/2015   Procedure: DEBRIDEMENT OF ACHILLES TENDON LEFT, REMOVAL OF FIBERWIRE SUTURE;  Surgeon: Dorna Leitz, MD;  Location: [facility];  Service: Orthopedics;  Laterality: Left;   LUMBAR LAMINECTOMY/DECOMPRESSION MICRODISCECTOMY N/A 07/23/2018   Procedure: LUMBAR LAMINECTOMY/DECOMPRESSION MICRODISCECTOMY 3 LEVELS; Lumbar 2-3, Lumbar 3-4, Lumbar 4-5;  Surgeon: Phylliss Bob, MD;  Location: [facility];  Service: Orthopedics;  Laterality: N/A;   MINOR APPLICATION OF WOUND VAC  04/12/2015   Procedure:  APPLICATION OF INCISIONAL WOUND VAC;  Surgeon: Dorna Leitz, MD;  Location: [facility];  Service: Orthopedics;;   TOTAL HIP ARTHROPLASTY Right 08/14/2015   Procedure: TOTAL HIP ARTHROPLASTY ANTERIOR APPROACH;  Surgeon: Dorna Leitz, MD;  Location: [facility];  Service: Orthopedics;  Laterality: Right;   TOTAL KNEE ARTHROPLASTY Left 01/08/2013   Procedure: TOTAL KNEE ARTHROPLASTY;  Surgeon: Alta Corning, MD;  Location: [facility];  Service: Orthopedics;  Laterality: Left;    Outpatient Medications Prior to Visit  Medication Sig Dispense Refill   acetaminophen (TYLENOL) 500 MG tablet Take 1,000 mg by mouth every 6 (six) hours as needed for moderate pain.     amLODipine (NORVASC) 5 MG tablet Take 1 tablet (5 mg total) by mouth daily. 90 tablet 3   losartan (COZAAR) 100 MG tablet Take 1 tablet (100 mg total) by mouth daily. 90 tablet 3   metoprolol succinate (TOPROL-XL) 50 MG 24 hr tablet Take 1 tablet (50 mg total)  by mouth daily. Take with or immediately following a meal. 90 tablet 3   traZODone (DESYREL) 50 MG tablet TAKE 1 TABLET BY MOUTH EVERYDAY AT BEDTIME 90 tablet 3   No facility-administered medications prior to visit.    Allergies  Allergen Reactions   Other Other (See Comments)    Kevlar stitching, infection    Bee Venom     UNSPECIFIED REACTION    Tape Rash    Steri strips         Objective:     Physical Exam Vitals and nursing note reviewed.  Constitutional:      General: She is not in acute  distress.    Appearance: Normal appearance.  HENT:     Head: Normocephalic.  Pulmonary:     Effort: No respiratory distress.  Musculoskeletal:     Cervical back: Normal range of motion.  Skin:    General: Skin is dry.     Coloration: Skin is not pale.  Neurological:     Mental Status: She is alert and oriented to person, place, and time.  Psychiatric:        Mood and Affect: Mood normal.   Ht 6\' 3"  (1.905 m)    Wt (!) 338 lb 6.5 oz (153.5 kg)    BMI 42.30 kg/m   Wt Readings from Last 3 Encounters:  07/12/21 (!) 338 lb 6.5 oz (153.5 kg)  02/05/21 (!) 338 lb 6.4 oz (153.5 kg)  10/29/18 (!) 335 lb (152 kg)       Assessment & Plan:   Problem List Items Addressed This Visit       Other   COVID-19 - Primary    sx started 3d ago, pt reports not feeling too bad, mainly cough, mild fever, pt morbidly obese. Sending Molnupiravir, pt advised of FDA label for emergency use, how to take, & SE. Advised of CDC guidelines for self isolation/ ending isolation.  Advised of safe practice guidelines. Symptom Tier reviewed.  Encouraged to monitor for any worsening symptoms; watch for increased shortness of breath, weakness, and signs of dehydration. Instructed to rest and hydrate well.  Advised to wear a mask if necessary to leave the house.       Relevant Medications   molnupiravir EUA (LAGEVRIO) 200 mg CAPS capsule    I discussed the assessment and treatment plan with the patient. The patient was provided an opportunity to ask questions and all were answered. The patient agreed with the plan and demonstrated an understanding of the instructions.   The patient was advised to call back or seek an in-person evaluation if the symptoms worsen or if the condition fails to improve as anticipated.  I provided 22 minutes of face-to-face time during this encounter.   Jeanie Sewer, NP [place] [phone] (phone) [phone] (fax)  [place]

## 2021-07-12 NOTE — Assessment & Plan Note (Addendum)
sx started 3d ago, pt reports not feeling too bad, mainly cough, mild fever, pt morbidly obese. Sending Molnupiravir, pt advised of FDA label for emergency use, how to take, & SE. Advised of CDC guidelines for self isolation/ ending isolation.  Advised of safe practice guidelines. Symptom Tier reviewed.  Encouraged to monitor for any worsening symptoms; watch for increased shortness of breath, weakness, and signs of dehydration. Instructed to rest and hydrate well.  Advised to wear a mask if necessary to leave the house.

## 2021-08-01 NOTE — Progress Notes (Signed)
Phone [phone] In person visit   Subjective:   Donald BROUILLET is a 66 y.o. year old very pleasant male patient who presents for/with See problem oriented charting Chief Complaint  Patient presents with   Hypertension   Mass    On back, redness and sore at times  No pain. Mass been there x 20 years getting bigger now    This visit occurred during the SARS-CoV-2 public health emergency.  Safety protocols were in place, including screening questions prior to the visit, additional usage of staff PPE, and extensive cleaning of exam room while observing appropriate contact time as indicated for disinfecting solutions.   Past Medical History-  Patient Active Problem List   Diagnosis Date Noted   Morbid obesity ([place]) 08/14/2015    Priority: High   Hyperlipidemia, unspecified 08/08/2021    Priority: Medium    Gout 05/11/2010    Priority: Medium    INSOMNIA-SLEEP DISORDER-UNSPEC 01/09/2010    Priority: Medium    Essential hypertension 12/14/2009    Priority: Medium    Infection due to Enterobacteriaceae     Priority: Low   S/P Achilles tendon repair 04/12/2015    Priority: Low   Achilles tendinitis of both lower extremities 06/20/2014    Priority: Low   Leg length difference, acquired 06/20/2014    Priority: Low   Hematochezia 04/07/2013    Priority: Low   Osteoarthritis of left knee 01/08/2013    Priority: Low   Allergic rhinitis, seasonal 11/04/2012    Priority: Low   COVID-19 07/12/2021   Primary osteoarthritis of right hip 08/14/2015    Medications- reviewed and updated Current Outpatient Medications  Medication Sig Dispense Refill   acetaminophen (TYLENOL) 500 MG tablet Take 1,000 mg by mouth every 6 (six) hours as needed for moderate pain.     amLODipine (NORVASC) 5 MG tablet Take 1 tablet (5 mg total) by mouth daily. 90 tablet 3   losartan (COZAAR) 100 MG tablet Take 1 tablet (100 mg total) by mouth daily. 90 tablet 3   metoprolol succinate (TOPROL-XL) 50 MG  24 hr tablet Take 1 tablet (50 mg total) by mouth daily. Take with or immediately following a meal. 90 tablet 3   traZODone (DESYREL) 50 MG tablet TAKE 1 TABLET BY MOUTH EVERYDAY AT BEDTIME 90 tablet 3   No current facility-administered medications for this visit.      Objective:  BP 138/80    Pulse 71    Temp 98.2 F (36.8 C) (Temporal)    Ht 6\' 3"  (1.905 m)    Wt (!) 338 lb 13.1 oz (153.7 kg)    SpO2 96%    BMI 42.35 kg/m  Gen: NAD, resting comfortably CV: RRR no murmurs rubs or gallops Lungs: CTAB no crackles, wheeze, rhonchi Abdomen: soft/nontender/nondistended/normal bowel sounds. No rebound or guarding.  Ext: 1+ edema, 8 x 8 cm fluctuant area on right upper back- patient reports there for 20 years but much larger- able to drain with hands/manipulatoin without I+D - foul smelling Skin: warm, dry    Assessment and Plan    Mass    On back, redness and sore at times  No pain. Mass been there x 20 years getting bigger now  -gets red/inflamed at times - currently inflammed - extensive sebaceous cyst 8 x 8 cm with extensive fluctuance- raised from skin over a cm - was able to drain without I+D but extensive purulence He will monitor for recurrent redness/swelling- if any worsening or he  has fever sor feels poorly will call me and I will call in antibiotic and do a recheck -recommended general surgery referral considering how large this got but he wants to hold for now   #hypertension S: mediation:  losaratan 100 mg everyday in AM, amplodipine 5 mg daily in AM,  and metoprolol 25 mg XR everyday in AM. Would forget if tried in PM Home readings #s: has gotten some 130s at home finally- 2 in 130s and one in low 140s at home with average under 140 BP Readings from Last 3 Encounters:  08/08/21 138/80  02/05/21 (!) 152/92  10/29/18 (!) 154/88  A/P: Controlled. Continue current medications. Could consider increasing meds but will focus on healthy eating/regular exercise- discussed some  ways to increase veggie intake  # morbid Obesity - BMI > 40 S: weight  stable this visit. Knees made exercise hard- one joy he had was eating- does not want to make changes- had some success with keto in past Wt Readings from Last 3 Encounters:  08/08/21 (!) 338 lb 13.1 oz (153.7 kg)  07/12/21 (!) 338 lb 6.5 oz (153.5 kg)  02/05/21 (!) 338 lb 6.4 oz (153.5 kg)  A/P: poor control- discussed ways to increase healthier foods. Asks about taking a fruit/veggie vitamin- id be fine with MV and increasing fruits and veggies   # insomnia S: was doing well with trazodone 50 mg everyday at bedtime last visit A/P: doing wel continue current meds   #Gout S:  rare flares despite not being onuric acid lowering agent . One time in last year had a possible slight flare Lab Results  Component Value Date   LABURIC 6.4 10/24/2017  A/P:uric acid has been slightly high but very rare flares- im ok with sparing diclofenac as long as home BP does not increase too much   #hyperlipidemia S: Medication:none  Lab Results  Component Value Date   CHOL 165 10/24/2017   HDL 52.30 10/24/2017   LDLCALC 90 10/24/2017   TRIG 115.0 10/24/2017   CHOLHDL 3 10/24/2017   A/P: mild elevations in past- update labs and ascvd risk. Hed be open to consider ct cardiac scoring  #had covid previously- largely recovered- felt like bronchitis- mild lingering sputum production but overall much better  #looking back high a1c in 2016- will check a1c   Health Maintenance Due  Topic Date Due   Zoster Vaccines- Shingrix (1 of 2)  Please check with your pharmacy to see if they have the shingrix vaccine. If they do- please get this immunization and update [place] by phone call or mychart with dates you receive the vaccine  Never done   TETANUS/TDAP - recommend at pharmacy 04/02/2020   COLONOSCOPY - ready for referral now 05/22/2020    [facility] contact Please call to schedule visit and/or procedure Address: [address],  [place], [place] [address] Phone: [phone]   Recommended follow up: Return in about 4 months (around 12/06/2021) for follow up- or sooner if needed.  Lab/Order associations:   ICD-10-CM   1. Essential hypertension  I10     2. Morbid obesity ([place])  E66.01     3. Gout, unspecified cause, unspecified chronicity, unspecified site  M10.9     4. Insomnia, unspecified type  G47.00     5. Hyperlipidemia, unspecified hyperlipidemia type  E78.5     6. Screen for colon cancer  Z12.11     7. Nocturia  R35.1       No orders of  the defined types were placed in this encounter.  Time Spent: 44 minutes of total time (10:20 Am- 11:04 AM) was spent on the date of the encounter performing the following actions: chart review prior to seeing the patient, obtaining history, performing a medically necessary exam and working on cyst, counseling on the treatment plan, placing orders, and documenting in our EHR.   I,Jada Bradford,acting as a scribe for Garret Reddish, MD.,have documented all relevant documentation on the behalf of Garret Reddish, MD,as directed by  Garret Reddish, MD while in the presence of Garret Reddish, MD.  I, Garret Reddish, MD, have reviewed all documentation for this visit. The documentation on 08/08/21 for the exam, diagnosis, procedures, and orders are all accurate and complete.   Return precautions advised.  Garret Reddish, MD

## 2021-08-08 ENCOUNTER — Encounter: Payer: Self-pay | Admitting: Family Medicine

## 2021-08-08 ENCOUNTER — Other Ambulatory Visit: Payer: Self-pay

## 2021-08-08 ENCOUNTER — Ambulatory Visit (INDEPENDENT_AMBULATORY_CARE_PROVIDER_SITE_OTHER): Payer: PPO | Admitting: Family Medicine

## 2021-08-08 VITALS — BP 138/80 | HR 71 | Temp 98.2°F | Ht 75.0 in | Wt 338.8 lb

## 2021-08-08 DIAGNOSIS — R351 Nocturia: Secondary | ICD-10-CM | POA: Diagnosis not present

## 2021-08-08 DIAGNOSIS — E785 Hyperlipidemia, unspecified: Secondary | ICD-10-CM | POA: Diagnosis not present

## 2021-08-08 DIAGNOSIS — Z1211 Encounter for screening for malignant neoplasm of colon: Secondary | ICD-10-CM | POA: Diagnosis not present

## 2021-08-08 DIAGNOSIS — I10 Essential (primary) hypertension: Secondary | ICD-10-CM

## 2021-08-08 DIAGNOSIS — G4700 Insomnia, unspecified: Secondary | ICD-10-CM | POA: Diagnosis not present

## 2021-08-08 DIAGNOSIS — M109 Gout, unspecified: Secondary | ICD-10-CM | POA: Diagnosis not present

## 2021-08-08 DIAGNOSIS — R739 Hyperglycemia, unspecified: Secondary | ICD-10-CM | POA: Diagnosis not present

## 2021-08-08 LAB — LIPID PANEL
Cholesterol: 148 mg/dL (ref 0–200)
HDL: 43.3 mg/dL (ref >39.00)
LDL Cholesterol: 74 mg/dL (ref 0–99)
NonHDL: 104.87
Total CHOL/HDL Ratio: 3
Triglycerides: 154 mg/dL — ABNORMAL HIGH (ref 0.0–149.0)
VLDL: 30.8 mg/dL (ref 0.0–40.0)

## 2021-08-08 LAB — COMPREHENSIVE METABOLIC PANEL
ALT: 33 U/L (ref 0–53)
AST: 27 U/L (ref 0–37)
Albumin: 4.7 g/dL (ref 3.5–5.2)
Alkaline Phosphatase: 50 U/L (ref 39–117)
BUN: 13 mg/dL (ref 6–23)
CO2: 26 mEq/L (ref 19–32)
Calcium: 10 mg/dL (ref 8.4–10.5)
Chloride: 102 mEq/L (ref 96–112)
Creatinine, Ser: 1.08 mg/dL (ref 0.40–1.50)
GFR: 72.42 mL/min (ref >60.00)
Glucose, Bld: 103 mg/dL — ABNORMAL HIGH (ref 70–99)
Potassium: 5.5 mEq/L — ABNORMAL HIGH (ref 3.5–5.1)
Sodium: 144 mEq/L (ref 135–145)
Total Bilirubin: 0.7 mg/dL (ref 0.2–1.2)
Total Protein: 7.8 g/dL (ref 6.0–8.3)

## 2021-08-08 LAB — CBC WITH DIFFERENTIAL/PLATELET
Basophils Absolute: 0.1 10*3/uL (ref 0.0–0.1)
Basophils Relative: 0.7 % (ref 0.0–3.0)
Eosinophils Absolute: 0.2 10*3/uL (ref 0.0–0.7)
Eosinophils Relative: 2.2 % (ref 0.0–5.0)
HCT: 43.2 % (ref 39.0–52.0)
Hemoglobin: 14.3 g/dL (ref 13.0–17.0)
Lymphocytes Relative: 26.8 % (ref 12.0–46.0)
Lymphs Abs: 2.3 10*3/uL (ref 0.7–4.0)
MCHC: 33.1 g/dL (ref 30.0–36.0)
MCV: 91.5 fl (ref 78.0–100.0)
Monocytes Absolute: 0.7 10*3/uL (ref 0.1–1.0)
Monocytes Relative: 8.6 % (ref 3.0–12.0)
Neutro Abs: 5.4 10*3/uL (ref 1.4–7.7)
Neutrophils Relative %: 61.7 % (ref 43.0–77.0)
Platelets: 313 10*3/uL (ref 150.0–400.0)
RBC: 4.72 Mil/uL (ref 4.22–5.81)
RDW: 12.8 % (ref 11.5–15.5)
WBC: 8.7 10*3/uL (ref 4.0–10.5)

## 2021-08-08 LAB — PSA: PSA: 0.34 ng/mL (ref 0.10–4.00)

## 2021-08-08 LAB — HEMOGLOBIN A1C: Hgb A1c MFr Bld: 5.9 % (ref 4.6–6.5)

## 2021-08-08 MED ORDER — DICLOFENAC SODIUM 75 MG PO TBEC: 75.0000 mg | DELAYED_RELEASE_TABLET | Freq: Two times a day (BID) | ORAL | 1 refills | Status: DC | PRN

## 2021-08-08 NOTE — Patient Instructions (Addendum)
Health Maintenance Due  Topic Date Due   Zoster Vaccines- Shingrix (1 of 2)  Please check with your pharmacy to see if they have the shingrix vaccine. If they do- please get this immunization and update [place] by phone call or mychart with dates you receive the vaccine  Never done   TETANUS/TDAP - recommend at pharmacy 04/02/2020   COLONOSCOPY - ready for referral now 05/22/2020    [facility] contact Please call to schedule visit and/or procedure Address: [address], [place], [place] [address] Phone: [phone]   Please stop by lab before you go If you have mychart- we will send your results within 3 business days of [place] receiving them.  If you do not have mychart- we will call you about results within 5 business days of [place] receiving them.  *please also note that you will see labs on mychart as soon as they post. I will later go in and write notes on them- will say "notes from Dr. Yong Channel"   Recommended follow up: Return in about 4 months (around 12/06/2021) for follow up- or sooner if needed.

## 2021-08-10 ENCOUNTER — Other Ambulatory Visit: Payer: Self-pay

## 2021-08-10 ENCOUNTER — Other Ambulatory Visit (INDEPENDENT_AMBULATORY_CARE_PROVIDER_SITE_OTHER): Payer: PPO

## 2021-08-10 ENCOUNTER — Encounter: Payer: Self-pay | Admitting: Family Medicine

## 2021-08-10 DIAGNOSIS — E875 Hyperkalemia: Secondary | ICD-10-CM | POA: Diagnosis not present

## 2021-08-10 LAB — BASIC METABOLIC PANEL
BUN: 11 mg/dL (ref 6–23)
CO2: 29 mEq/L (ref 19–32)
Calcium: 9.5 mg/dL (ref 8.4–10.5)
Chloride: 102 mEq/L (ref 96–112)
Creatinine, Ser: 0.91 mg/dL (ref 0.40–1.50)
GFR: 88.94 mL/min (ref >60.00)
Glucose, Bld: 96 mg/dL (ref 70–99)
Potassium: 4.9 mEq/L (ref 3.5–5.1)
Sodium: 138 mEq/L (ref 135–145)

## 2021-08-13 ENCOUNTER — Encounter: Payer: Self-pay | Admitting: Internal Medicine

## 2021-10-04 ENCOUNTER — Ambulatory Visit (AMBULATORY_SURGERY_CENTER): Payer: PPO | Admitting: *Deleted

## 2021-10-04 VITALS — Ht 75.0 in | Wt 335.0 lb

## 2021-10-04 DIAGNOSIS — Z1211 Encounter for screening for malignant neoplasm of colon: Secondary | ICD-10-CM

## 2021-10-04 MED ORDER — PEG 3350-KCL-NA BICARB-NACL 420 G PO SOLR: 4000.0000 mL | Freq: Once | ORAL | 0 refills | Status: AC

## 2021-10-04 NOTE — Progress Notes (Signed)

## 2021-10-08 ENCOUNTER — Other Ambulatory Visit: Payer: Self-pay | Admitting: Family Medicine

## 2021-10-10 ENCOUNTER — Encounter: Payer: Self-pay | Admitting: Internal Medicine

## 2021-10-18 ENCOUNTER — Ambulatory Visit (AMBULATORY_SURGERY_CENTER): Payer: PPO | Admitting: Internal Medicine

## 2021-10-18 ENCOUNTER — Encounter: Payer: Self-pay | Admitting: Internal Medicine

## 2021-10-18 VITALS — BP 136/78 | HR 69 | Temp 98.9°F | Resp 15 | Ht 75.0 in | Wt 335.0 lb

## 2021-10-18 DIAGNOSIS — Z86010 Personal history of colonic polyps: Secondary | ICD-10-CM | POA: Diagnosis not present

## 2021-10-18 DIAGNOSIS — K635 Polyp of colon: Secondary | ICD-10-CM

## 2021-10-18 DIAGNOSIS — D125 Benign neoplasm of sigmoid colon: Secondary | ICD-10-CM

## 2021-10-18 DIAGNOSIS — Z1211 Encounter for screening for malignant neoplasm of colon: Secondary | ICD-10-CM

## 2021-10-18 MED ORDER — SODIUM CHLORIDE 0.9 % IV SOLN: 500.0000 mL | Freq: Once | INTRAVENOUS | Status: DC

## 2021-10-18 NOTE — Progress Notes (Signed)
PT taken to PACU. Monitors in place. VSS. Report given to RN. 

## 2021-10-18 NOTE — Op Note (Signed)
[place] ?Patient Name: Donald [name] ?Procedure Date: 10/18/2021 7:41 AM ?MRN: [id number] ?Endoscopist: Docia Chuck. Henrene Pastor , MD ?Age: 66 ?Referring MD:  ?Date of Birth: [birth year] ?Gender: Male ?Account #: [id number] ?Procedure:                Colonoscopy with cold snare polypectomy x 1 ?Indications:              Screening for colorectal malignant neoplasm.  ?                          Average risk. Previous examination 2011 with a  ?                          hyperplastic polyp ?Medicines:                Monitored Anesthesia Care ?Procedure:                Pre-Anesthesia Assessment: ?                          - Prior to the procedure, a History and Physical  ?                          was performed, and patient medications and  ?                          allergies were reviewed. The patient's tolerance of  ?                          previous anesthesia was also reviewed. The risks  ?                          and benefits of the procedure and the sedation  ?                          options and risks were discussed with the patient.  ?                          All questions were answered, and informed consent  ?                          was obtained. Prior Anticoagulants: The patient has  ?                          taken no previous anticoagulant or antiplatelet  ?                          agents. ASA Grade Assessment: II - A patient with  ?                          mild systemic disease. After reviewing the risks  ?                          and benefits, the patient was deemed in  ?  satisfactory condition to undergo the procedure. ?                          After obtaining informed consent, the colonoscope  ?                          was passed under direct vision. Throughout the  ?                          procedure, the patient's blood pressure, pulse, and  ?                          oxygen saturations were monitored continuously. The  ?                          CF [id number] #[id number]  was introduced through the anus  ?                          and advanced to the the cecum, identified by  ?                          appendiceal orifice and ileocecal valve. The  ?                          ileocecal valve, appendiceal orifice, and rectum  ?                          were photographed. The quality of the bowel  ?                          preparation was excellent. The colonoscopy was  ?                          performed without difficulty. The patient tolerated  ?                          the procedure well. The bowel preparation used was  ?                          SUPREP via split dose instruction. ?Scope In: 8:15:49 AM ?Scope Out: 8:42:56 AM ?Scope Withdrawal Time: 0 hours 21 minutes 47 seconds  ?Total Procedure Duration: 0 hours 27 minutes 7 seconds  ?Findings:                 A 5 mm polyp was found in the sigmoid colon. The  ?                          polyp was removed with a cold snare. Resection and  ?                          retrieval were complete. ?                          Many small and large-mouthed diverticula were found  ?  in the entire colon. ?                          The exam was otherwise without abnormality on  ?                          direct and retroflexion views. ?Complications:            No immediate complications. ?Estimated Blood Loss:     Estimated blood loss: none. Estimated blood loss:  ?                          none. ?Impression:               - One 5 mm polyp in the sigmoid colon, removed with  ?                          a cold snare. Resected and retrieved. ?                          - Diverticulosis in the entire examined colon. ?                          - The examination was otherwise normal on direct  ?                          and retroflexion views. ?Recommendation:           - Repeat colonoscopy in 10 years for surveillance. ?                          - Patient has a contact number available for  ?                           emergencies. The signs and symptoms of potential  ?                          delayed complications were discussed with the  ?                          patient. Return to normal activities tomorrow.  ?                          Written discharge instructions were provided to the  ?                          patient. ?                          - Resume previous diet. ?                          - Continue present medications. ?                          - Repeat colonoscopy in 10 years for screening  ?  purposes. ?Docia Chuck. Henrene Pastor, MD ?10/18/2021 9:06:57 AM ?This report has been signed electronically. ?

## 2021-10-18 NOTE — Progress Notes (Signed)
VS completed by DT.  Pt's states no medical or surgical changes since previsit or office visit.  

## 2021-10-18 NOTE — Progress Notes (Signed)
? ?  Established Patient Office Visit ? ?Subjective   ?Patient ID: Donald [name], male    DOB: 20-May-1956  Age: 66 y.o. MRN: [id number] ? ?Chief Complaint  ?Patient presents with  ? Colonoscopy  ?  Screening/[place]  ? ? ?HPI ? ? ? ?ROS ? ?  ?Objective:  ?  ? ?BP 136/78   Pulse 69   Temp 98.9 ?F (37.2 ?C) (Temporal)   Resp 15   Ht '6\' 3"'$  (1.905 m)   Wt (!) 335 lb (152 kg)   SpO2 94%   BMI 41.87 kg/m?  ? ? ?Physical Exam ? ? ?No results found for any visits on 10/18/21. ? ? ? ?The 10-year ASCVD risk score (Arnett DK, et al., 2019) is: 13.3% ? ?  ?Assessment & Plan:  ? ?Problem List Items Addressed This Visit   ?None ?Visit Diagnoses   ? ? Special screening for malignant neoplasms, colon    -  Primary  ? Relevant Orders  ? Diet NPO  ? Hypoglycemia/Hyperglycemia protocol  ? Emergency Medications  ? ECG and pulse monitoring   ? Monitor NIBP  ? Notify physician  ? Monitor O2 SATs  ? NPO status  ? Vital signs  ? Monitor NIBP, ECG and PSA02  ? Notify physician  ? Discharge home with responsible adult.  ? Discharge instructions  ? Colonoscopy: verify informed consent  ? Surgical pathology ([facility])  ? Benign neoplasm of sigmoid colon      ? Relevant Orders  ? Surgical pathology ([facility])  ? ?  ? ? ?No follow-ups on file.  ? ? ?Brayton Layman, RN ? ?

## 2021-10-18 NOTE — Patient Instructions (Addendum)
Thank you for coming in to see [place] today! ?Recommend next surveillance colonoscopy in 10 years. ? ? ? ?YOU HAD AN ENDOSCOPIC PROCEDURE TODAY AT [facility]:   Refer to the procedure report that was given to you for any specific questions about what was found during the examination.  If the procedure report does not answer your questions, please call your gastroenterologist to clarify.  If you requested that your care partner not be given the details of your procedure findings, then the procedure report has been included in a sealed envelope for you to review at your convenience later. ? ?YOU SHOULD EXPECT: Some feelings of bloating in the abdomen. Passage of more gas than usual.  Walking can help get rid of the air that was put into your GI tract during the procedure and reduce the bloating. If you had a lower endoscopy (such as a colonoscopy or flexible sigmoidoscopy) you may notice spotting of blood in your stool or on the toilet paper. If you underwent a bowel prep for your procedure, you may not have a normal bowel movement for a few days. ? ?Please Note:  You might notice some irritation and congestion in your nose or some drainage.  This is from the oxygen used during your procedure.  There is no need for concern and it should clear up in a day or so. ? ?SYMPTOMS TO REPORT IMMEDIATELY: ? ?Following lower endoscopy (colonoscopy or flexible sigmoidoscopy): ? Excessive amounts of blood in the stool ? Significant tenderness or worsening of abdominal pains ? Swelling of the abdomen that is new, acute ? Fever of 100?F or higher ? ? ? ?For urgent or emergent issues, a gastroenterologist can be reached at any hour by calling [phone]. ?Do not use MyChart messaging for urgent concerns.  ? ? ?DIET:  We do recommend a small meal at first, but then you may proceed to your regular diet.  Drink plenty of fluids but you should avoid alcoholic beverages for 24 hours. ? ?ACTIVITY:  You should plan to take  it easy for the rest of today and you should NOT DRIVE or use heavy machinery until tomorrow (because of the sedation medicines used during the test).   ? ?FOLLOW UP: ?Our staff will call the number listed on your records 48-72 hours following your procedure to check on you and address any questions or concerns that you may have regarding the information given to you following your procedure. If we do not reach you, we will leave a message.  We will attempt to reach you two times.  During this call, we will ask if you have developed any symptoms of COVID 19. If you develop any symptoms (ie: fever, flu-like symptoms, shortness of breath, cough etc.) before then, please call [phone].  If you test positive for Covid 19 in the 2 weeks post procedure, please call and report this information to [place].   ? ?If any biopsies were taken you will be contacted by phone or by letter within the next 1-3 weeks.  Please call us at [phone] if you have not heard about the biopsies in 3 weeks.  ? ? ?SIGNATURES/CONFIDENTIALITY: ?You and/or your care partner have signed paperwork which will be entered into your electronic medical record.  These signatures attest to the fact that that the information above on your After Visit Summary has been reviewed and is understood.  Full responsibility of the confidentiality of this discharge information lies with you and/or your  care-partner.  ?

## 2021-10-18 NOTE — Progress Notes (Signed)
HISTORY OF PRESENT ILLNESS: ? ?Donald [name] is a 66 y.o. male who underwent index colonoscopy 2011.  3 polyps removed.  Found to have hyperplastic polyp and foodstuff.  He now presents today for follow-up colonoscopy.  No active complaints ? ?REVIEW OF SYSTEMS: ? ?All non-GI ROS negative. ?Past Medical History:  ?Diagnosis Date  ? Arthritis   ? Bronchitis   ? HISTORY FEW MO AGO  ? Colitis   ? Colon polyps   ? Diverticulitis   ? Diverticulosis 04/07/2013  ? Gout   ? Hyperlipidemia   ? Hypertension   ? ? ?Past Surgical History:  ?Procedure Laterality Date  ? ACHILLES TENDON REPAIR  08/01/2014  ? X 2  ? COLONOSCOPY    ? COLONOSCOPY W/ POLYPECTOMY    ? INCISION AND DRAINAGE OF WOUND Left 04/12/2015  ? Procedure: DEBRIDEMENT OF ACHILLES TENDON LEFT, REMOVAL OF FIBERWIRE SUTURE;  Surgeon: Dorna Leitz, MD;  Location: [facility];  Service: Orthopedics;  Laterality: Left;  ? LUMBAR LAMINECTOMY/DECOMPRESSION MICRODISCECTOMY N/A 07/23/2018  ? Procedure: LUMBAR LAMINECTOMY/DECOMPRESSION MICRODISCECTOMY 3 LEVELS; Lumbar 2-3, Lumbar 3-4, Lumbar 4-5;  Surgeon: Phylliss Bob, MD;  Location: [facility];  Service: Orthopedics;  Laterality: N/A;  ? MINOR APPLICATION OF WOUND VAC  04/12/2015  ? Procedure:  APPLICATION OF INCISIONAL WOUND VAC;  Surgeon: Dorna Leitz, MD;  Location: [facility];  Service: Orthopedics;;  ? TOTAL HIP ARTHROPLASTY Right 08/14/2015  ? Procedure: TOTAL HIP ARTHROPLASTY ANTERIOR APPROACH;  Surgeon: Dorna Leitz, MD;  Location: [facility];  Service: Orthopedics;  Laterality: Right;  ? TOTAL KNEE ARTHROPLASTY Left 01/08/2013  ? Procedure: TOTAL KNEE ARTHROPLASTY;  Surgeon: Alta Corning, MD;  Location: [facility];  Service: Orthopedics;  Laterality: Left;  ? ? ?Social History ?Donald [name]  reports that he has never smoked. He has never used smokeless tobacco. He reports current alcohol use. He reports that he does not use drugs. ? ?family history includes Arthritis in his brother; COPD in his mother; Cancer in his father; Emphysema  in his mother. ? ?Allergies  ?Allergen Reactions  ? Other Other (See Comments)  ?  Kevlar stitching, infection   ? Bee Venom   ?  UNSPECIFIED REACTION   ? Tape Rash  ?  Steri strips ?  ? ? ?  ? ?PHYSICAL EXAMINATION: ? ?Vital signs: BP (!) 132/55   Pulse 79   Temp 98.9 ?F (37.2 ?C) (Temporal)   Resp 12   Ht '6\' 3"'$  (1.905 m)   Wt (!) 335 lb (152 kg)   SpO2 93%   BMI 41.87 kg/m?  ?General: Well-developed, well-nourished, no acute distress ?HEENT: Sclerae are anicteric, conjunctiva pink. Oral mucosa intact ?Lungs: Clear ?Heart: Regular ?Abdomen: soft, nontender, nondistended, no obvious ascites, no peritoneal signs, normal bowel sounds. No organomegaly. ?Extremities: No edema ?Psychiatric: alert and oriented x3. Cooperative  ? ? ? ?ASSESSMENT: ? ?Colon cancer screening.  Hyperplastic polyp 2011 ? ? ?PLAN: ? ? ?Screening colonoscopy ? ? ? ?  ?

## 2021-10-18 NOTE — Progress Notes (Signed)
Called to room to assist during endoscopic procedure.  Patient ID and intended procedure confirmed with present staff. Received instructions for my participation in the procedure from the performing physician.  

## 2021-10-22 ENCOUNTER — Telehealth: Payer: Self-pay | Admitting: *Deleted

## 2021-10-22 ENCOUNTER — Encounter: Payer: Self-pay | Admitting: Internal Medicine

## 2021-10-22 NOTE — Telephone Encounter (Signed)
?  Follow up Call- ? ? ?  10/18/2021  ?  7:15 AM  ?Call back number  ?Post procedure Call Back phone  # [phone]  ?Permission to leave phone message Yes  ?  ? ?Patient questions: ? ?Do you have a fever, pain , or abdominal swelling? No. ?Pain Score  0 * ? ?Have you tolerated food without any problems? Yes.   ? ?Have you been able to return to your normal activities? Yes.   ? ?Do you have any questions about your discharge instructions: ?Diet   No. ?Medications  No. ?Follow up visit  No. ? ?Do you have questions or concerns about your Care? No. ? ?Actions: ?* If pain score is 4 or above: ?No action needed, pain <4. ? ? ?

## 2021-12-03 ENCOUNTER — Other Ambulatory Visit: Payer: Self-pay | Admitting: Family Medicine

## 2021-12-05 ENCOUNTER — Ambulatory Visit: Payer: PPO | Admitting: Family Medicine

## 2022-01-02 ENCOUNTER — Telehealth: Payer: Self-pay | Admitting: Family Medicine

## 2022-01-02 NOTE — Telephone Encounter (Signed)
Copied from [place] [id number]. Topic: Medicare AWV >> Jan 02, 2022 11:31 AM Devoria Glassing wrote: Reason for CRM: Left message for patient to schedule Annual Wellness Visit.  Please schedule with Nurse Health Advisor Charlott Rakes, RN at [facility]. This appt can be telephone or office visit. Please call [phone] ask for [facility]

## 2022-01-27 ENCOUNTER — Other Ambulatory Visit: Payer: Self-pay | Admitting: Family Medicine

## 2022-02-19 ENCOUNTER — Other Ambulatory Visit: Payer: Self-pay | Admitting: Family Medicine

## 2022-05-13 ENCOUNTER — Ambulatory Visit (INDEPENDENT_AMBULATORY_CARE_PROVIDER_SITE_OTHER): Payer: PPO

## 2022-05-13 VITALS — Wt 335.0 lb

## 2022-05-13 DIAGNOSIS — Z0000 Encounter for general adult medical examination without abnormal findings: Secondary | ICD-10-CM

## 2022-05-13 NOTE — Patient Instructions (Signed)
[name] , Thank you for taking time to come for your Medicare Wellness Visit. I appreciate your ongoing commitment to your health goals. Please review the following plan we discussed and let me know if I can assist you in the future.   These are the goals we discussed:  Goals      Patient Stated     None at this time         This is a list of the screening recommended for you and due dates:  Health Maintenance  Topic Date Due   Zoster (Shingles) Vaccine (1 of 2) Never done   Tetanus Vaccine  04/02/2020   Pneumonia Vaccine (1 - PCV) Never done   Medicare Annual Wellness Visit  05/14/2023   Colon Cancer Screening  10/19/2031   Flu Shot  Completed   Hepatitis C Screening: USPSTF Recommendation to screen - Ages 65-79 yo.  Completed   HIV Screening  Completed   HPV Vaccine  Aged Out   COVID-19 Vaccine  Discontinued    Advanced directives: Please bring a copy of your health care power of attorney and living will to the office at your convenience.  Conditions/risks identified: none at this time   Next appointment: Follow up in one year for your annual wellness visit.   Preventive Care 85 Years and Older, Male  Preventive care refers to lifestyle choices and visits with your health care provider that can promote health and wellness. What does preventive care include? A yearly physical exam. This is also called an annual well check. Dental exams once or twice a year. Routine eye exams. Ask your health care provider how often you should have your eyes checked. Personal lifestyle choices, including: Daily care of your teeth and gums. Regular physical activity. Eating a healthy diet. Avoiding tobacco and drug use. Limiting alcohol use. Practicing safe sex. Taking low doses of aspirin every day. Taking vitamin and mineral supplements as recommended by your health care provider. What happens during an annual well check? The services and screenings done by your health care  provider during your annual well check will depend on your age, overall health, lifestyle risk factors, and family history of disease. Counseling  Your health care provider may ask you questions about your: Alcohol use. Tobacco use. Drug use. Emotional well-being. Home and relationship well-being. Sexual activity. Eating habits. History of falls. Memory and ability to understand (cognition). Work and work Statistician. Screening  You may have the following tests or measurements: Height, weight, and BMI. Blood pressure. Lipid and cholesterol levels. These may be checked every 5 years, or more frequently if you are over 65 years old. Skin check. Lung cancer screening. You may have this screening every year starting at age 65 if you have a 30-pack-year history of smoking and currently smoke or have quit within the past 15 years. Fecal occult blood test (FOBT) of the stool. You may have this test every year starting at age 65. Flexible sigmoidoscopy or colonoscopy. You may have a sigmoidoscopy every 5 years or a colonoscopy every 10 years starting at age 65. Prostate cancer screening. Recommendations will vary depending on your family history and other risks. Hepatitis C blood test. Hepatitis B blood test. Sexually transmitted disease (STD) testing. Diabetes screening. This is done by checking your blood sugar (glucose) after you have not eaten for a while (fasting). You may have this done every 1-3 years. Abdominal aortic aneurysm (AAA) screening. You may need this if you are a current  or former smoker. Osteoporosis. You may be screened starting at age 65 if you are at high risk. Talk with your health care provider about your test results, treatment options, and if necessary, the need for more tests. Vaccines  Your health care provider may recommend certain vaccines, such as: Influenza vaccine. This is recommended every year. Tetanus, diphtheria, and acellular pertussis (Tdap, Td)  vaccine. You may need a Td booster every 10 years. Zoster vaccine. You may need this after age 65. Pneumococcal 13-valent conjugate (PCV13) vaccine. One dose is recommended after age 65. Pneumococcal polysaccharide (PPSV23) vaccine. One dose is recommended after age 65. Talk to your health care provider about which screenings and vaccines you need and how often you need them. This information is not intended to replace advice given to you by your health care provider. Make sure you discuss any questions you have with your health care provider. Document Released: 07/14/2015 Document Revised: 03/06/2016 Document Reviewed: 04/18/2015 Elsevier Interactive Patient Education  2017 [place] Prevention in the Home Falls can cause injuries. They can happen to people of all ages. There are many things you can do to make your home safe and to help prevent falls. What can I do on the outside of my home? Regularly fix the edges of walkways and driveways and fix any cracks. Remove anything that might make you trip as you walk through a door, such as a raised step or threshold. Trim any bushes or trees on the path to your home. Use bright outdoor lighting. Clear any walking paths of anything that might make someone trip, such as rocks or tools. Regularly check to see if handrails are loose or broken. Make sure that both sides of any steps have handrails. Any raised decks and porches should have guardrails on the edges. Have any leaves, snow, or ice cleared regularly. Use sand or salt on walking paths during winter. Clean up any spills in your garage right away. This includes oil or grease spills. What can I do in the bathroom? Use night lights. Install grab bars by the toilet and in the tub and shower. Do not use towel bars as grab bars. Use non-skid mats or decals in the tub or shower. If you need to sit down in the shower, use a plastic, non-slip stool. Keep the floor dry. Clean up any  water that spills on the floor as soon as it happens. Remove soap buildup in the tub or shower regularly. Attach bath mats securely with double-sided non-slip rug tape. Do not have throw rugs and other things on the floor that can make you trip. What can I do in the bedroom? Use night lights. Make sure that you have a light by your bed that is easy to reach. Do not use any sheets or blankets that are too big for your bed. They should not hang down onto the floor. Have a firm chair that has side arms. You can use this for support while you get dressed. Do not have throw rugs and other things on the floor that can make you trip. What can I do in the kitchen? Clean up any spills right away. Avoid walking on wet floors. Keep items that you use a lot in easy-to-reach places. If you need to reach something above you, use a strong step stool that has a grab bar. Keep electrical cords out of the way. Do not use floor polish or wax that makes floors slippery. If you must use wax,  use non-skid floor wax. Do not have throw rugs and other things on the floor that can make you trip. What can I do with my stairs? Do not leave any items on the stairs. Make sure that there are handrails on both sides of the stairs and use them. Fix handrails that are broken or loose. Make sure that handrails are as long as the stairways. Check any carpeting to make sure that it is firmly attached to the stairs. Fix any carpet that is loose or worn. Avoid having throw rugs at the top or bottom of the stairs. If you do have throw rugs, attach them to the floor with carpet tape. Make sure that you have a light switch at the top of the stairs and the bottom of the stairs. If you do not have them, ask someone to add them for you. What else can I do to help prevent falls? Wear shoes that: Do not have high heels. Have rubber bottoms. Are comfortable and fit you well. Are closed at the toe. Do not wear sandals. If you use a  stepladder: Make sure that it is fully opened. Do not climb a closed stepladder. Make sure that both sides of the stepladder are locked into place. Ask someone to hold it for you, if possible. Clearly mark and make sure that you can see: Any grab bars or handrails. First and last steps. Where the edge of each step is. Use tools that help you move around (mobility aids) if they are needed. These include: Canes. Walkers. Scooters. Crutches. Turn on the lights when you go into a dark area. Replace any light bulbs as soon as they burn out. Set up your furniture so you have a clear path. Avoid moving your furniture around. If any of your floors are uneven, fix them. If there are any pets around you, be aware of where they are. Review your medicines with your doctor. Some medicines can make you feel dizzy. This can increase your chance of falling. Ask your doctor what other things that you can do to help prevent falls. This information is not intended to replace advice given to you by your health care provider. Make sure you discuss any questions you have with your health care provider. Document Released: 04/13/2009 Document Revised: 11/23/2015 Document Reviewed: 07/22/2014 Elsevier Interactive Patient Education  2017 [employer].

## 2022-05-13 NOTE — Progress Notes (Signed)
I connected with  Franchot Gallo on 05/13/22 by a audio enabled telemedicine application and verified that I am speaking with the correct person using two identifiers.  Patient Location: Home  Provider Location: Office/Clinic  I discussed the limitations of evaluation and management by telemedicine. The patient expressed understanding and agreed to proceed.   Subjective:   Donald [name] is a 66 y.o. male who presents for Medicare Annual/Subsequent preventive examination.  Review of Systems     Cardiac Risk Factors include: advanced age (>58mn, >>52women);hypertension;dyslipidemia;male gender;obesity (BMI >30kg/m2)     Objective:    Today's Vitals   05/13/22 0937  Weight: (!) 335 lb (152 kg)   Body mass index is 41.87 kg/m.     05/13/2022    9:42 AM 07/23/2018   11:25 AM 07/21/2018    1:07 PM 07/14/2017    7:05 AM 08/16/2015   11:00 AM 08/04/2015    1:42 PM 04/07/2015   10:57 AM  Advanced Directives  Does Patient Have a Medical Advance Directive? No No No No No No No  Would patient like information on creating a medical advance directive? No - Patient declined No - Patient declined No - Patient declined No - Patient declined No - patient declined information Yes - Educational materials given No - patient declined information    Current Medications (verified) Outpatient Encounter Medications as of 05/13/2022  Medication Sig   acetaminophen (TYLENOL) 500 MG tablet Take 1,000 mg by mouth every 6 (six) hours as needed for moderate pain.   amLODipine (NORVASC) 5 MG tablet TAKE 1 TABLET (5 MG TOTAL) BY MOUTH DAILY.   diclofenac (VOLTAREN) 75 MG EC tablet TAKE 1 TABLET BY MOUTH TWICE A DAY AS NEEDED FOR MILD PAIN   FLUAD QUADRIVALENT 0.5 ML injection    losartan (COZAAR) 100 MG tablet TAKE 1 TABLET BY MOUTH EVERY DAY   metoprolol succinate (TOPROL-XL) 50 MG 24 hr tablet TAKE 1 TABLET BY MOUTH DAILY. TAKE WITH OR IMMEDIATELY FOLLOWING A MEAL.   traZODone (DESYREL) 50 MG tablet  TAKE 1 TABLET BY MOUTH EVERYDAY AT BEDTIME   [DISCONTINUED] Multiple Vitamin (MULTIVITAMIN ADULT PO) Take by mouth.   No facility-administered encounter medications on file as of 05/13/2022.    Allergies (verified) Other, Bee venom, and Tape   History: Past Medical History:  Diagnosis Date   Arthritis    Bronchitis    HISTORY FEW MO AGO   Colitis    Colon polyps    Diverticulitis    Diverticulosis 04/07/2013   Gout    Hyperlipidemia    Hypertension    Past Surgical History:  Procedure Laterality Date   ACHILLES TENDON REPAIR  08/01/2014   X 2   COLONOSCOPY     COLONOSCOPY W/ POLYPECTOMY     INCISION AND DRAINAGE OF WOUND Left 04/12/2015   Procedure: DEBRIDEMENT OF ACHILLES TENDON LEFT, REMOVAL OF FIBERWIRE SUTURE;  Surgeon: JDorna Leitz MD;  [facility]  Service: Orthopedics;  Laterality: Left;   LUMBAR LAMINECTOMY/DECOMPRESSION MICRODISCECTOMY N/A 07/23/2018   Procedure: LUMBAR LAMINECTOMY/DECOMPRESSION MICRODISCECTOMY 3 LEVELS; Lumbar 2-3, Lumbar 3-4, Lumbar 4-5;  Surgeon: DPhylliss Bob MD;  Location[facility]  Service: Orthopedics;  Laterality: N/A;   MINOR APPLICATION OF WOUND VAC  04/12/2015   Procedure:  APPLICATION OF INCISIONAL WOUND VAC;  Surgeon: JDorna Leitz MD;  [place] [facility]  Service: Orthopedics;;   TOTAL HIP ARTHROPLASTY Right 08/14/2015   Procedure: TOTAL HIP ARTHROPLASTY ANTERIOR APPROACH;  Surgeon: JDorna Leitz MD;  Location: [facility];  Service: Orthopedics;  Laterality: Right;   TOTAL KNEE ARTHROPLASTY Left 01/08/2013   Procedure: TOTAL KNEE ARTHROPLASTY;  Surgeon: Alta Corning, MD;  Location: [facility];  Service: Orthopedics;  Laterality: Left;   Family History  Problem Relation Age of Onset   Emphysema Mother    COPD Mother    Cancer Father    Arthritis Brother    Colon polyps Neg Hx    Colon cancer Neg Hx    Esophageal cancer Neg Hx    Stomach cancer Neg Hx    Rectal cancer Neg Hx    Social History   Socioeconomic History   Marital  status: Single    Spouse name: Not on file   Number of children: Not on file   Years of education: Not on file   Highest education level: Not on file  Occupational History   Occupation: Building services engineer: [employer]  Tobacco Use   Smoking status: Never   Smokeless tobacco: Never  Vaping Use   Vaping Use: Never used  Substance and Sexual Activity   Alcohol use: Yes    Comment: light   Drug use: No   Sexual activity: Not on file  Other Topics Concern   Not on file  Social History Narrative   Lives alone. No pets. No kids.       Prior security at [employer]   Hasnt worked since knee surgery, ruptured achilles that later infected, right hip and low back surgery      Hobbies: enjoys watching the [employer]   Social Determinants of Health   Financial Resource Strain: Low Risk  (05/13/2022)   Overall Financial Resource Strain (CARDIA)    Difficulty of Paying Living Expenses: Not hard at all  Food Insecurity: No Food Insecurity (05/13/2022)   Hunger Vital Sign    Worried About Running Out of Food in the Last Year: Never true    Ran Out of Food in the Last Year: Never true  Transportation Needs: No Transportation Needs (05/13/2022)   PRAPARE - Hydrologist (Medical): No    Lack of Transportation (Non-Medical): No  Physical Activity: Inactive (05/13/2022)   Exercise Vital Sign    Days of Exercise per Week: 0 days    Minutes of Exercise per Session: 0 min  Stress: No Stress Concern Present (05/13/2022)   [place]    Feeling of Stress : Not at all  Social Connections: Socially Isolated (05/13/2022)   Social Connection and Isolation Panel [NHANES]    Frequency of Communication with Friends and Family: More than three times a week    Frequency of Social Gatherings with Friends and Family: More than three times a week    Attends Religious Services: Never     Marine scientist or Organizations: No    Attends Music therapist: Never    Marital Status: Never married    Tobacco Counseling Counseling given: Not Answered   Clinical Intake:  Pre-visit preparation completed: Yes  Pain : No/denies pain     BMI - recorded: 41.87 Nutritional Status: BMI > 30  Obese Nutritional Risks: None Diabetes: No  How often do you need to have someone help you when you read instructions, pamphlets, or other written materials from your doctor or pharmacy?: 1 - Never  Diabetic?no  Interpreter Needed?: No  Information entered by :: Charlott Rakes, LPN  Activities of Daily Living    05/13/2022    9:43 AM 05/12/2022   11:46 AM  In your present state of health, do you have any difficulty performing the following activities:  Hearing? 0 0  Vision? 0 0  Difficulty concentrating or making decisions? 0 0  Walking or climbing stairs? 0 1  Dressing or bathing? 0 0  Doing errands, shopping? 0 0  Preparing Food and eating ? N N  Using the Toilet? N N  In the past six months, have you accidently leaked urine? N N  Do you have problems with loss of bowel control? N N  Managing your Medications? N N  Managing your Finances? N N  Housekeeping or managing your Housekeeping? N N    Patient Care Team: Marin Olp, MD as PCP - General (Family Medicine)  Indicate any recent Medical Services you may have received from other than [facility] providers in the past year (date may be approximate).     Assessment:   This is a routine wellness examination for Donald [name].  Hearing/Vision screen Hearing Screening - Comments:: Denies any hearing issues   Dietary issues and exercise activities discussed: Current Exercise Habits: The patient does not participate in regular exercise at present   Goals Addressed             This Visit's Progress    Patient Stated       None at this time        Depression Screen    05/13/2022    9:40  AM 08/08/2021    9:57 AM 02/05/2021   11:06 AM 10/29/2018   12:10 PM 07/25/2017   10:15 AM 10/04/2016   11:23 AM 05/18/2015    9:47 AM  PHQ 2/9 Scores  PHQ - 2 Score 0 0 0 0 0 0 0    Fall Risk    05/13/2022    9:43 AM 05/12/2022   11:46 AM 08/08/2021    9:57 AM 07/12/2021    1:50 PM 02/05/2021   11:05 AM  Fall Risk   Falls in the past year? 1 1 0 0 0  Number falls in past yr: 1 0 0  0  Injury with Fall? 0 0 1  0  Risk for fall due to : Impaired balance/gait;Impaired mobility;Impaired vision  No Fall Risks No Fall Risks History of fall(s)  Follow up Falls prevention discussed    Falls evaluation completed    FALL RISK PREVENTION PERTAINING TO THE HOME:  Any stairs in or around the home? Yes  If so, are there any without handrails? No  Home free of loose throw rugs in walkways, pet beds, electrical cords, etc? Yes  Adequate lighting in your home to reduce risk of falls? Yes   ASSISTIVE DEVICES UTILIZED TO PREVENT FALLS:  Life alert? No  Use of a cane, walker or w/c? No  Grab bars in the bathroom? No  Shower chair or bench in shower? No  Elevated toilet seat or a handicapped toilet? No   TIMED UP AND GO:  Was the test performed? No .  Cognitive Function:        05/13/2022    9:44 AM  6CIT Screen  What Year? 0 points  What month? 0 points  What time? 0 points  Count back from 20 0 points  Months in reverse 0 points  Repeat phrase 0 points  Total Score 0 points    Immunizations Immunization History  Administered Date(s) Administered  Fluad Quad(high Dose 65+) 03/21/2022   Influenza Inj Mdck Quad Pf 03/27/2018, 02/28/2021   Influenza Split 04/05/2011   Influenza Whole 04/29/2007, 04/05/2008, 03/23/2009, 04/02/2010   Influenza,inj,Quad PF,6+ Mos 04/09/2013, 03/31/2015, 04/08/2017, 03/24/2019, 03/22/2020   Influenza-Unspecified 04/14/2014, 03/20/2016   PFIZER(Purple Top)SARS-COV-2 Vaccination 09/13/2019, 10/04/2019, 04/12/2020   Td 04/02/2010    TDAP status: Due,  Education has been provided regarding the importance of this vaccine. Advised may receive this vaccine at local pharmacy or Health Dept. Aware to provide a copy of the vaccination record if obtained from local pharmacy or Health Dept. Verbalized acceptance and understanding.  Flu Vaccine status: Up to date  Pneumococcal vaccine status: Declined,  Education has been provided regarding the importance of this vaccine but patient still declined. Advised may receive this vaccine at local pharmacy or Health Dept. Aware to provide a copy of the vaccination record if obtained from local pharmacy or Health Dept. Verbalized acceptance and understanding.   Covid-19 vaccine status: Completed vaccines  Qualifies for Shingles Vaccine? Yes   Zostavax completed No   Shingrix Completed?: No.    Education has been provided regarding the importance of this vaccine. Patient has been advised to call insurance company to determine out of pocket expense if they have not yet received this vaccine. Advised may also receive vaccine at local pharmacy or Health Dept. Verbalized acceptance and understanding.  Screening Tests Health Maintenance  Topic Date Due   Zoster Vaccines- Shingrix (1 of 2) Never done   TETANUS/TDAP  04/02/2020   Pneumonia Vaccine 48+ Years old (1 - PCV) Never done   Medicare Annual Wellness (AWV)  05/14/2023   COLONOSCOPY (Pts 45-12yr Insurance coverage will need to be confirmed)  10/19/2031   INFLUENZA VACCINE  Completed   Hepatitis C Screening  Completed   HIV Screening  Completed   HPV VACCINES  Aged Out   COVID-19 Vaccine  Discontinued    Health Maintenance  Health Maintenance Due  Topic Date Due   Zoster Vaccines- Shingrix (1 of 2) Never done   TETANUS/TDAP  04/02/2020   Pneumonia Vaccine 65 Years old (1 - PCV) Never done    Colorectal cancer screening: Type of screening: Colonoscopy. Completed 10/18/21. Repeat every 10 years   Additional Screening:  Hepatitis C Screening:   Completed 08/04/15  Vision Screening: Recommended annual ophthalmology exams for early detection of glaucoma and other disorders of the eye. Is the patient up to date with their annual eye exam?  Yes  Who is the provider or what is the name of the office in which the patient attends annual eye exams?  If pt is not established with a provider, would they like to be referred to a provider to establish care? No .   Dental Screening: Recommended annual dental exams for proper oral hygiene  Community Resource Referral / Chronic Care Management: CRR required this visit?  No   CCM required this visit?  No      Plan:     I have personally reviewed and noted the following in the patient's chart:   Medical and social history Use of alcohol, tobacco or illicit drugs  Current medications and supplements including opioid prescriptions. Patient is not currently taking opioid prescriptions. Functional ability and status Nutritional status Physical activity Advanced directives List of other physicians Hospitalizations, surgeries, and ER visits in previous 12 months Vitals Screenings to include cognitive, depression, and falls Referrals and appointments  In addition, I have reviewed and discussed with patient certain preventive protocols, quality  metrics, and best practice recommendations. A written personalized care plan for preventive services as well as general preventive health recommendations were provided to patient.     Willette Brace, LPN   [id number]   Nurse Notes: none

## 2023-01-08 ENCOUNTER — Telehealth: Payer: Self-pay | Admitting: Family Medicine

## 2023-01-08 ENCOUNTER — Other Ambulatory Visit: Payer: Self-pay | Admitting: Family Medicine

## 2023-01-08 MED ORDER — LOSARTAN POTASSIUM 100 MG PO TABS: 100.0000 mg | ORAL_TABLET | Freq: Every day | ORAL | 0 refills | Status: DC

## 2023-01-08 MED ORDER — TRAZODONE HCL 50 MG PO TABS: ORAL_TABLET | ORAL | 0 refills | Status: DC

## 2023-01-08 MED ORDER — AMLODIPINE BESYLATE 5 MG PO TABS: 5.0000 mg | ORAL_TABLET | Freq: Every day | ORAL | 0 refills | Status: DC

## 2023-01-08 NOTE — Telephone Encounter (Signed)
Rx sent to requested pharmacy

## 2023-01-08 NOTE — Telephone Encounter (Signed)
PT HAS HIS CPE APPT ON SEPT 11 AND NEEDS ENOUGH RX UNTIL THE APPT DATE     Prescription Request  01/08/2023  LOV: Visit date not found  What is the name of the medication or equipment?  metoprolol succinate (TOPROL-XL) 50 MG 24 hr tablet   losartan (COZAAR) 100 MG tablet   amLODipine (NORVASC) 5 MG tablet   traZODone (DESYREL) 50 MG tablet    Have you contacted your pharmacy to request a refill? Yes   Which pharmacy would you like this sent to?  [id number] [place], [place] - [address] [place] [address] Phone: [phone] Fax: [phone]    Patient notified that their request is being sent to the clinical staff for review and that they should receive a response within 2 business days.   Please advise at Mobile [phone] (mobile)

## 2023-01-13 ENCOUNTER — Other Ambulatory Visit: Payer: Self-pay

## 2023-01-13 ENCOUNTER — Encounter: Payer: Self-pay | Admitting: Family Medicine

## 2023-01-13 MED ORDER — METOPROLOL SUCCINATE ER 50 MG PO TB24: 50.0000 mg | ORAL_TABLET | Freq: Every day | ORAL | 3 refills | Status: DC

## 2023-02-13 ENCOUNTER — Encounter (INDEPENDENT_AMBULATORY_CARE_PROVIDER_SITE_OTHER): Payer: Self-pay

## 2023-03-09 ENCOUNTER — Other Ambulatory Visit: Payer: Self-pay | Admitting: Family Medicine

## 2023-03-10 ENCOUNTER — Ambulatory Visit (INDEPENDENT_AMBULATORY_CARE_PROVIDER_SITE_OTHER): Payer: PPO

## 2023-03-10 VITALS — Wt 335.0 lb

## 2023-03-10 DIAGNOSIS — Z0000 Encounter for general adult medical examination without abnormal findings: Secondary | ICD-10-CM

## 2023-03-10 NOTE — Patient Instructions (Signed)
Donald [name] , Thank you for taking time to come for your Medicare Wellness Visit. I appreciate your ongoing commitment to your health goals. Please review the following plan we discussed and let me know if I can assist you in the future.   Referrals/Orders/Follow-Ups/Clinician Recommendations: stay healthy   This is a list of the screening recommended for you and due dates:  Health Maintenance  Topic Date Due   Zoster (Shingles) Vaccine (1 of 2) Never done   DTaP/Tdap/Td vaccine (2 - Tdap) 04/02/2020   Pneumonia Vaccine (1 of 1 - PCV) Never done   Flu Shot  01/30/2023   Medicare Annual Wellness Visit  03/09/2024   Colon Cancer Screening  10/19/2031   Hepatitis C Screening  Completed   HPV Vaccine  Aged Out   COVID-19 Vaccine  Discontinued    Advanced directives: (Copy Requested) Please bring a copy of your health care power of attorney and living will to the office to be added to your chart at your convenience.  Next Medicare Annual Wellness Visit scheduled for next year: Yes

## 2023-03-10 NOTE — Progress Notes (Signed)
Subjective:   Donald [name] is a 67 y.o. male who presents for Medicare Annual/Subsequent preventive examination.  Visit Complete: Virtual  I connected with  [name] on 03/10/23 by a audio enabled telemedicine application and verified that I am speaking with the correct person using two identifiers.  Patient Location: Home  Provider Location: Office/Clinic  I discussed the limitations of evaluation and management by telemedicine. The patient expressed understanding and agreed to proceed.  Patient Medicare AWV questionnaire was completed by the patient on 03/08/23; I have confirmed that all information answered by patient is correct and no changes since this date.  Vital Signs: Unable to obtain new vitals due to this being a telehealth visit.   Review of Systems     Cardiac Risk Factors include: advanced age (>67men, >25 women);dyslipidemia;hypertension;obesity (BMI >30kg/m2);male gender     Objective:    Today's Vitals   03/10/23 1403  Weight: (!) 335 lb (152 kg)   Body mass index is 41.87 kg/m.     03/10/2023    2:06 PM 05/13/2022    9:42 AM 07/23/2018   11:25 AM 07/21/2018    1:07 PM 07/14/2017    7:05 AM 08/16/2015   11:00 AM 08/04/2015    1:42 PM  Advanced Directives  Does Patient Have a Medical Advance Directive? Yes No No No No No No  Type of Estate agent of [place];Living will        Copy of Healthcare Power of Attorney in Chart? No - copy requested        Would patient like information on creating a medical advance directive?  No - Patient declined No - Patient declined No - Patient declined No - Patient declined No - patient declined information Yes - Educational materials given    Current Medications (verified) Outpatient Encounter Medications as of 03/10/2023  Medication Sig   acetaminophen (TYLENOL) 500 MG tablet Take 1,000 mg by mouth every 6 (six) hours as needed for moderate pain.   amLODipine (NORVASC) 5 MG tablet Take 1  tablet (5 mg total) by mouth daily.   diclofenac (VOLTAREN) 75 MG EC tablet TAKE 1 TABLET BY MOUTH TWICE A DAY AS NEEDED FOR MILD PAIN   losartan (COZAAR) 100 MG tablet Take 1 tablet (100 mg total) by mouth daily.   metoprolol succinate (TOPROL-XL) 50 MG 24 hr tablet Take 1 tablet (50 mg total) by mouth daily. TAKE WITH OR IMMEDIATELY FOLLOWING A MEAL.   traZODone (DESYREL) 50 MG tablet TAKE 1 TABLET BY MOUTH EVERYDAY AT BEDTIME   FLUAD QUADRIVALENT 0.5 ML injection    No facility-administered encounter medications on file as of 03/10/2023.    Allergies (verified) Other, Bee venom, and Tape   History: Past Medical History:  Diagnosis Date   Arthritis    Bronchitis    HISTORY FEW MO AGO   Colitis    Colon polyps    Diverticulitis    Diverticulosis 04/07/2013   Gout    Hyperlipidemia    Hypertension    Past Surgical History:  Procedure Laterality Date   ACHILLES TENDON REPAIR  08/01/2014   X 2   COLONOSCOPY     COLONOSCOPY W/ POLYPECTOMY     INCISION AND DRAINAGE OF WOUND Left 04/12/2015   Procedure: DEBRIDEMENT OF ACHILLES TENDON LEFT, REMOVAL OF FIBERWIRE SUTURE;  Surgeon: Jodi Geralds, MD;  Location: [facility];  Service: Orthopedics;  Laterality: Left;   LUMBAR LAMINECTOMY/DECOMPRESSION MICRODISCECTOMY N/A 07/23/2018   Procedure: LUMBAR LAMINECTOMY/DECOMPRESSION  MICRODISCECTOMY 3 LEVELS; Lumbar 2-3, Lumbar 3-4, Lumbar 4-5;  Surgeon: Estill Bamberg, MD;  Location: [facility];  Service: Orthopedics;  Laterality: N/A;   MINOR APPLICATION OF WOUND VAC  04/12/2015   Procedure:  APPLICATION OF INCISIONAL WOUND VAC;  Surgeon: Jodi Geralds, MD;  Location: [facility];  Service: Orthopedics;;   TOTAL HIP ARTHROPLASTY Right 08/14/2015   Procedure: TOTAL HIP ARTHROPLASTY ANTERIOR APPROACH;  Surgeon: Jodi Geralds, MD;  Location: [facility];  Service: Orthopedics;  Laterality: Right;   TOTAL KNEE ARTHROPLASTY Left 01/08/2013   Procedure: TOTAL KNEE ARTHROPLASTY;  Surgeon: Harvie Junior, MD;  Location: [facility];   Service: Orthopedics;  Laterality: Left;   Family History  Problem Relation Age of Onset   Emphysema Mother    COPD Mother    Cancer Father    Arthritis Brother    Colon polyps Neg Hx    Colon cancer Neg Hx    Esophageal cancer Neg Hx    Stomach cancer Neg Hx    Rectal cancer Neg Hx    Social History   Socioeconomic History   Marital status: Single    Spouse name: Not on file   Number of children: Not on file   Years of education: Not on file   Highest education level: Not on file  Occupational History   Occupation: Event organiser: [employer]  Tobacco Use   Smoking status: Never   Smokeless tobacco: Never  Vaping Use   Vaping status: Never Used  Substance and Sexual Activity   Alcohol use: Yes    Comment: light   Drug use: No   Sexual activity: Not on file  Other Topics Concern   Not on file  Social History Narrative   Lives alone. No pets. No kids.       Prior security at [employer]   Hasnt worked since knee surgery, ruptured achilles that later infected, right hip and low back surgery      Hobbies: enjoys watching the METS   Social Determinants of Health   Financial Resource Strain: Low Risk  (03/08/2023)   Overall Financial Resource Strain (CARDIA)    Difficulty of Paying Living Expenses: Not very hard  Food Insecurity: No Food Insecurity (03/08/2023)   Hunger Vital Sign    Worried About Running Out of Food in the Last Year: Never true    Ran Out of Food in the Last Year: Never true  Transportation Needs: No Transportation Needs (03/08/2023)   PRAPARE - Administrator, Civil Service (Medical): No    Lack of Transportation (Non-Medical): No  Physical Activity: Inactive (03/08/2023)   Exercise Vital Sign    Days of Exercise per Week: 0 days    Minutes of Exercise per Session: 0 min  Stress: No Stress Concern Present (03/08/2023)   [employer] of Occupational Health - Occupational Stress Questionnaire     Feeling of Stress : Only a little  Social Connections: Socially Isolated (03/08/2023)   Social Connection and Isolation Panel [NHANES]    Frequency of Communication with Friends and Family: Three times a week    Frequency of Social Gatherings with Friends and Family: Twice a week    Attends Religious Services: Never    Database administrator or Organizations: No    Attends Banker Meetings: Never    Marital Status: Never married    Tobacco Counseling Counseling given: Not Answered   Clinical Intake:  Pre-visit preparation  completed: Yes  Pain : No/denies pain     Nutritional Status: BMI > 30  Obese Nutritional Risks: None Diabetes: No  How often do you need to have someone help you when you read instructions, pamphlets, or other written materials from your doctor or pharmacy?: 1 - Never  Interpreter Needed?: No  Information entered by :: Lanier Ensign, LPN   Activities of Daily Living    03/08/2023   10:11 AM 05/13/2022    9:43 AM  In your present state of health, do you have any difficulty performing the following activities:  Hearing? 0 0  Vision? 0 0  Difficulty concentrating or making decisions? 0 0  Walking or climbing stairs? 0 0  Dressing or bathing? 0 0  Doing errands, shopping? 0 0  Preparing Food and eating ? N N  Using the Toilet? N N  In the past six months, have you accidently leaked urine? N N  Do you have problems with loss of bowel control? N N  Managing your Medications? N N  Managing your Finances? N N  Housekeeping or managing your Housekeeping? N N    Patient Care Team: Shelva Majestic, MD as PCP - General (Family Medicine)  Indicate any recent Medical Services you may have received from other than [facility] providers in the past year (date may be approximate).     Assessment:   This is a routine wellness examination for [place].  Hearing/Vision screen Hearing Screening - Comments:: Pt denies any hearing issues  Vision  Screening - Comments:: Pt follows up with Dr Shea Evans for annual eye exams    Goals Addressed             This Visit's Progress    Patient Stated       Staying alive        Depression Screen    03/10/2023    2:06 PM 05/13/2022    9:40 AM 08/08/2021    9:57 AM 02/05/2021   11:06 AM 10/29/2018   12:10 PM 07/25/2017   10:15 AM 10/04/2016   11:23 AM  PHQ 2/9 Scores  PHQ - 2 Score 0 0 0 0 0 0 0    Fall Risk    03/08/2023   10:11 AM 05/13/2022    9:43 AM 05/12/2022   11:46 AM 08/08/2021    9:57 AM 07/12/2021    1:50 PM  Fall Risk   Falls in the past year? 0 1 1 0 0  Number falls in past yr:  1 0 0   Injury with Fall?  0 0 1   Risk for fall due to : Impaired vision;Impaired balance/gait Impaired balance/gait;Impaired mobility;Impaired vision  No Fall Risks No Fall Risks  Follow up Falls prevention discussed Falls prevention discussed       MEDICARE RISK AT HOME: Medicare Risk at Home Any stairs in or around the home?: Yes If so, are there any without handrails?: No Home free of loose throw rugs in walkways, pet beds, electrical cords, etc?: Yes Adequate lighting in your home to reduce risk of falls?: Yes Life alert?: No Use of a cane, walker or w/c?: No Grab bars in the bathroom?: No Shower chair or bench in shower?: No Elevated toilet seat or a handicapped toilet?: Yes  TIMED UP AND GO:  Was the test performed?  No    Cognitive Function:        03/10/2023    2:08 PM 05/13/2022    9:44 AM  6CIT  Screen  What Year? 0 points 0 points  What month? 0 points 0 points  What time? 0 points 0 points  Count back from 20 0 points 0 points  Months in reverse 0 points 0 points  Repeat phrase 0 points 0 points  Total Score 0 points 0 points    Immunizations Immunization History  Administered Date(s) Administered   Fluad Quad(high Dose 65+) 03/21/2022   Influenza Inj Mdck Quad Pf 03/27/2018, 02/28/2021   Influenza Split 04/05/2011   Influenza Whole 04/29/2007, 04/05/2008,  03/23/2009, 04/02/2010   Influenza,inj,Quad PF,6+ Mos 04/09/2013, 03/31/2015, 04/08/2017, 03/24/2019, 03/22/2020   Influenza-Unspecified 04/14/2014, 03/20/2016   PFIZER(Purple Top)SARS-COV-2 Vaccination 09/13/2019, 10/04/2019, 04/12/2020   Td 04/02/2010    TDAP status: Due, Education has been provided regarding the importance of this vaccine. Advised may receive this vaccine at local pharmacy or Health Dept. Aware to provide a copy of the vaccination record if obtained from local pharmacy or Health Dept. Verbalized acceptance and understanding.  Flu Vaccine status: Due, Education has been provided regarding the importance of this vaccine. Advised may receive this vaccine at local pharmacy or Health Dept. Aware to provide a copy of the vaccination record if obtained from local pharmacy or Health Dept. Verbalized acceptance and understanding.  Pneumococcal vaccine status: Declined,  Education has been provided regarding the importance of this vaccine but patient still declined. Advised may receive this vaccine at local pharmacy or Health Dept. Aware to provide a copy of the vaccination record if obtained from local pharmacy or Health Dept. Verbalized acceptance and understanding.   Covid-19 vaccine status: Information provided on how to obtain vaccines.   Qualifies for Shingles Vaccine? Yes   Zostavax completed No   Shingrix Completed?: No.    Education has been provided regarding the importance of this vaccine. Patient has been advised to call insurance company to determine out of pocket expense if they have not yet received this vaccine. Advised may also receive vaccine at local pharmacy or Health Dept. Verbalized acceptance and understanding.  Screening Tests Health Maintenance  Topic Date Due   Zoster Vaccines- Shingrix (1 of 2) Never done   DTaP/Tdap/Td (2 - Tdap) 04/02/2020   Pneumonia Vaccine 52+ Years old (1 of 1 - PCV) Never done   INFLUENZA VACCINE  01/30/2023   Medicare Annual  Wellness (AWV)  03/09/2024   Colonoscopy  10/19/2031   Hepatitis C Screening  Completed   HPV VACCINES  Aged Out   COVID-19 Vaccine  Discontinued    Health Maintenance  Health Maintenance Due  Topic Date Due   Zoster Vaccines- Shingrix (1 of 2) Never done   DTaP/Tdap/Td (2 - Tdap) 04/02/2020   Pneumonia Vaccine 62+ Years old (1 of 1 - PCV) Never done   INFLUENZA VACCINE  01/30/2023    Colorectal cancer screening: Type of screening: Colonoscopy. Completed 10/18/21. Repeat every 10 years    Additional Screening:  Hepatitis C Screening:  Completed 08/04/15  Vision Screening: Recommended annual ophthalmology exams for early detection of glaucoma and other disorders of the eye. Is the patient up to date with their annual eye exam?  Yes  Who is the provider or what is the name of the office in which the patient attends annual eye exams? Dr Shea Evans If pt is not established with a provider, would they like to be referred to a provider to establish care? No .   Dental Screening: Recommended annual dental exams for proper oral hygiene   Community Resource Referral / Chronic Care  Management: CRR required this visit?  No   CCM required this visit?  No     Plan:     I have personally reviewed and noted the following in the patient's chart:   Medical and social history Use of alcohol, tobacco or illicit drugs  Current medications and supplements including opioid prescriptions. Patient is not currently taking opioid prescriptions. Functional ability and status Nutritional status Physical activity Advanced directives List of other physicians Hospitalizations, surgeries, and ER visits in previous 12 months Vitals Screenings to include cognitive, depression, and falls Referrals and appointments  In addition, I have reviewed and discussed with patient certain preventive protocols, quality metrics, and best practice recommendations. A written personalized care plan for preventive  services as well as general preventive health recommendations were provided to patient.     Marzella Schlein, LPN   07/09/1476   After Visit Summary: (MyChart) Due to this being a telephonic visit, the after visit summary with patients personalized plan was offered to patient via MyChart   Nurse Notes: none

## 2023-03-12 ENCOUNTER — Ambulatory Visit (INDEPENDENT_AMBULATORY_CARE_PROVIDER_SITE_OTHER): Payer: PPO | Admitting: Family Medicine

## 2023-03-12 ENCOUNTER — Encounter: Payer: Self-pay | Admitting: Family Medicine

## 2023-03-12 VITALS — BP 120/80 | HR 69 | Temp 97.7°F | Ht 75.0 in | Wt 341.0 lb

## 2023-03-12 DIAGNOSIS — Z23 Encounter for immunization: Secondary | ICD-10-CM | POA: Diagnosis not present

## 2023-03-12 DIAGNOSIS — Z125 Encounter for screening for malignant neoplasm of prostate: Secondary | ICD-10-CM

## 2023-03-12 DIAGNOSIS — M109 Gout, unspecified: Secondary | ICD-10-CM | POA: Diagnosis not present

## 2023-03-12 DIAGNOSIS — R739 Hyperglycemia, unspecified: Secondary | ICD-10-CM | POA: Diagnosis not present

## 2023-03-12 DIAGNOSIS — Z0000 Encounter for general adult medical examination without abnormal findings: Secondary | ICD-10-CM

## 2023-03-12 DIAGNOSIS — I10 Essential (primary) hypertension: Secondary | ICD-10-CM | POA: Diagnosis not present

## 2023-03-12 DIAGNOSIS — E785 Hyperlipidemia, unspecified: Secondary | ICD-10-CM

## 2023-03-12 DIAGNOSIS — Z131 Encounter for screening for diabetes mellitus: Secondary | ICD-10-CM | POA: Diagnosis not present

## 2023-03-12 LAB — LIPID PANEL
Cholesterol: 147 mg/dL (ref 0–200)
HDL: 49.8 mg/dL (ref >39.00)
LDL Cholesterol: 68 mg/dL (ref 0–99)
NonHDL: 97.01
Total CHOL/HDL Ratio: 3
Triglycerides: 147 mg/dL (ref 0.0–149.0)
VLDL: 29.4 mg/dL (ref 0.0–40.0)

## 2023-03-12 LAB — CBC WITH DIFFERENTIAL/PLATELET
Basophils Absolute: 0.1 10*3/uL (ref 0.0–0.1)
Basophils Relative: 0.8 % (ref 0.0–3.0)
Eosinophils Absolute: 0.1 10*3/uL (ref 0.0–0.7)
Eosinophils Relative: 1.9 % (ref 0.0–5.0)
HCT: 43 % (ref 39.0–52.0)
Hemoglobin: 14.2 g/dL (ref 13.0–17.0)
Lymphocytes Relative: 28 % (ref 12.0–46.0)
Lymphs Abs: 2 10*3/uL (ref 0.7–4.0)
MCHC: 33.2 g/dL (ref 30.0–36.0)
MCV: 91.7 fl (ref 78.0–100.0)
Monocytes Absolute: 0.6 10*3/uL (ref 0.1–1.0)
Monocytes Relative: 8.2 % (ref 3.0–12.0)
Neutro Abs: 4.4 10*3/uL (ref 1.4–7.7)
Neutrophils Relative %: 61.1 % (ref 43.0–77.0)
Platelets: 333 10*3/uL (ref 150.0–400.0)
RBC: 4.68 Mil/uL (ref 4.22–5.81)
RDW: 13.5 % (ref 11.5–15.5)
WBC: 7.2 10*3/uL (ref 4.0–10.5)

## 2023-03-12 LAB — COMPREHENSIVE METABOLIC PANEL
ALT: 26 U/L (ref 0–53)
AST: 22 U/L (ref 0–37)
Albumin: 4.4 g/dL (ref 3.5–5.2)
Alkaline Phosphatase: 44 U/L (ref 39–117)
BUN: 15 mg/dL (ref 6–23)
CO2: 26 meq/L (ref 19–32)
Calcium: 9.5 mg/dL (ref 8.4–10.5)
Chloride: 104 meq/L (ref 96–112)
Creatinine, Ser: 0.93 mg/dL (ref 0.40–1.50)
GFR: 85.69 mL/min (ref >60.00)
Glucose, Bld: 102 mg/dL — ABNORMAL HIGH (ref 70–99)
Potassium: 4.7 meq/L (ref 3.5–5.1)
Sodium: 140 meq/L (ref 135–145)
Total Bilirubin: 0.7 mg/dL (ref 0.2–1.2)
Total Protein: 7.1 g/dL (ref 6.0–8.3)

## 2023-03-12 LAB — PSA, MEDICARE: PSA: 0.4 ng/mL (ref 0.10–4.00)

## 2023-03-12 LAB — HEMOGLOBIN A1C: Hgb A1c MFr Bld: 6.2 % (ref 4.6–6.5)

## 2023-03-12 MED ORDER — LOSARTAN POTASSIUM 100 MG PO TABS: 100.0000 mg | ORAL_TABLET | Freq: Every day | ORAL | 3 refills | Status: DC

## 2023-03-12 MED ORDER — AMLODIPINE BESYLATE 5 MG PO TABS: 5.0000 mg | ORAL_TABLET | Freq: Every day | ORAL | 3 refills | Status: DC

## 2023-03-12 MED ORDER — DICLOFENAC SODIUM 75 MG PO TBEC: DELAYED_RELEASE_TABLET | ORAL | 0 refills | Status: DC

## 2023-03-12 MED ORDER — METOPROLOL SUCCINATE ER 50 MG PO TB24: 50.0000 mg | ORAL_TABLET | Freq: Every day | ORAL | 3 refills | Status: DC

## 2023-03-12 MED ORDER — TRAZODONE HCL 50 MG PO TABS: ORAL_TABLET | ORAL | 3 refills | Status: DC

## 2023-03-12 NOTE — Progress Notes (Signed)
Phone: [phone]   Subjective:  Patient presents today for their annual physical. Chief complaint-noted.   See problem oriented charting- ROS- full  review of systems was completed and negative  Per full ROS sheet completed by patient other than some aches and pains- reports suspected arthritis such as back  The following were reviewed and entered/updated in epic: Past Medical History:  Diagnosis Date   Allergy    Arthritis    Bronchitis    HISTORY FEW MO AGO   Colitis    Colon polyps    Diverticulitis    Diverticulosis 04/07/2013   Gout    Hyperlipidemia    Hypertension    Patient Active Problem List   Diagnosis Date Noted   Morbid obesity (HCC) 08/14/2015    Priority: High   Hyperlipidemia, unspecified 08/08/2021    Priority: Medium    Gout 05/11/2010    Priority: Medium    INSOMNIA-SLEEP DISORDER-UNSPEC 01/09/2010    Priority: Medium    Essential hypertension 12/14/2009    Priority: Medium    Infection due to Enterobacteriaceae     Priority: Low   S/P Achilles tendon repair 04/12/2015    Priority: Low   Achilles tendinitis of both lower extremities 06/20/2014    Priority: Low   Leg length difference, acquired 06/20/2014    Priority: Low   Hematochezia 04/07/2013    Priority: Low   Osteoarthritis of left knee 01/08/2013    Priority: Low   Allergic rhinitis, seasonal 11/04/2012    Priority: Low   Primary osteoarthritis of right hip 08/14/2015   Past Surgical History:  Procedure Laterality Date   ACHILLES TENDON REPAIR  08/01/2014   X 2   COLONOSCOPY     COLONOSCOPY W/ POLYPECTOMY     INCISION AND DRAINAGE OF WOUND Left 04/12/2015   Procedure: DEBRIDEMENT OF ACHILLES TENDON LEFT, REMOVAL OF FIBERWIRE SUTURE;  Surgeon: Jodi Geralds, MD;  Location: [facility];  Service: Orthopedics;  Laterality: Left;   JOINT REPLACEMENT     LUMBAR LAMINECTOMY/DECOMPRESSION MICRODISCECTOMY N/A 07/23/2018   Procedure: LUMBAR LAMINECTOMY/DECOMPRESSION MICRODISCECTOMY 3 LEVELS;  Lumbar 2-3, Lumbar 3-4, Lumbar 4-5;  Surgeon: Estill Bamberg, MD;  Location: [facility];  Service: Orthopedics;  Laterality: N/A;   MINOR APPLICATION OF WOUND VAC  04/12/2015   Procedure:  APPLICATION OF INCISIONAL WOUND VAC;  Surgeon: Jodi Geralds, MD;  Location: [facility];  Service: Orthopedics;;   SPINE SURGERY     TOTAL HIP ARTHROPLASTY Right 08/14/2015   Procedure: TOTAL HIP ARTHROPLASTY ANTERIOR APPROACH;  Surgeon: Jodi Geralds, MD;  Location: [facility];  Service: Orthopedics;  Laterality: Right;   TOTAL KNEE ARTHROPLASTY Left 01/08/2013   Procedure: TOTAL KNEE ARTHROPLASTY;  Surgeon: Harvie Junior, MD;  Location: [facility];  Service: Orthopedics;  Laterality: Left;    Family History  Problem Relation Age of Onset   Emphysema Mother    COPD Mother    Lung cancer Father        quit smoking 40 years prior   Arthritis Brother    Cancer Brother        pancreatic cancer- 2.5 year struggle   Diabetes Brother    Colon polyps Neg Hx    Colon cancer Neg Hx    Esophageal cancer Neg Hx    Stomach cancer Neg Hx    Rectal cancer Neg Hx     Medications- reviewed and updated Current Outpatient Medications  Medication Sig Dispense Refill   acetaminophen (TYLENOL) 500 MG tablet Take 1,000 mg by mouth  every 6 (six) hours as needed for moderate pain.     amLODipine (NORVASC) 5 MG tablet Take 1 tablet (5 mg total) by mouth daily. 90 tablet 3   diclofenac (VOLTAREN) 75 MG EC tablet TAKE 1 TABLET BY MOUTH TWICE A DAY AS NEEDED FOR MILD PAIN. NEED visit every 6 months for kidney monitoring if taking regularly 180 tablet 0   losartan (COZAAR) 100 MG tablet Take 1 tablet (100 mg total) by mouth daily. 90 tablet 3   metoprolol succinate (TOPROL-XL) 50 MG 24 hr tablet Take 1 tablet (50 mg total) by mouth daily. TAKE WITH OR IMMEDIATELY FOLLOWING A MEAL. 90 tablet 3   traZODone (DESYREL) 50 MG tablet TAKE 1 TABLET BY MOUTH EVERYDAY AT BEDTIME 90 tablet 3   No current facility-administered medications for this visit.     Allergies-reviewed and updated Allergies  Allergen Reactions   Other Other (See Comments)    Kevlar stitching, infection    Bee Venom     UNSPECIFIED REACTION    Tape Rash    Steri strips     Social History   Social History Narrative   Lives alone. No pets. No kids.       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 Oropharynx normal Neck: no thyromegaly CV: RRR no murmurs rubs or gallops Lungs: CTAB no crackles, wheeze, rhonchi Abdomen: soft/nontender/nondistended/normal bowel sounds. No rebound or guarding.  Ext: no edema Skin: warm, dry Neuro: grossly normal, moves all extremities, PERRLA   Assessment and Plan  67 y.o. male presenting for annual physical.  Health Maintenance counseling: 1. Anticipatory guidance: Patient counseled regarding regular dental exams -q6 months, eye exams -yearly,  avoiding smoking and second hand smoke , limiting alcohol to 2 beverages per day - 6 a week, no illicit drugs .   2. Risk factor reduction:  Advised patient of need for regular exercise and diet rich and fruits and vegetables to reduce risk of heart attack and stroke.  Exercise- none- limited by aches and pains- declines wtaer walking option.  Diet/weight management-weight up 3 lbs from last visit here- discussed importance of weight loss for health and joints- he declines interventions- discussed myfitnesspal and responded with firm no Morbid obesity status noted with BMI over 40 Wt Readings from Last 3 Encounters:  03/12/23 (!) 341 lb (154.7 kg)  03/10/23 (!) 335 lb (152 kg)  05/13/22 (!) 335 lb (152 kg)  3.  Immunizations/screenings/ancillary studies- flu shot today, opts out of prevnar 20 (hold off for now)  and shingrix (would have to get at pharmacy but declines) Immunization History  Administered Date(s) Administered   Fluad Quad(high Dose 65+) 03/21/2022   Influenza Inj Mdck Quad Pf 03/27/2018, 02/28/2021   Influenza Split 04/05/2011   Influenza Whole 04/29/2007, 04/05/2008, 03/23/2009, 04/02/2010   Influenza,inj,Quad PF,6+ Mos 04/09/2013, 03/31/2015, 04/08/2017, 03/24/2019, 03/22/2020   Influenza-Unspecified 04/14/2014, 03/20/2016   PFIZER(Purple Top)SARS-COV-2 Vaccination 09/13/2019, 10/04/2019, 04/12/2020   Td 04/02/2010  4. Prostate cancer screening-  low risk prior trend- update psa today  Lab  Results  Component Value Date   PSA 0.34 08/08/2021   PSA 0.53 03/26/2010   5. Colon cancer screening -  10/18/2021 with 10 year repeat 6. Skin cancer screening- no dermatology. advised regular sunscreen use. Denies worrisome, changing, or new skin lesions.  7. Smoking associated screening (lung cancer screening, AAA screen 65-75, UA)- never smoker 8. STD screening - not dating and not sexually active  Status of chronic or acute concerns   #social update- lost brother in last year to pancreatic cancer after 2.5 year struggle  #hypertension S: medication: amlodipine 5 mg, losartan 100 mg, metoprolol 50 mg extended release BP Readings from Last 3 Encounters:  03/12/23 120/80  10/18/21 136/78  08/08/21 138/80  A/P: stable- continue current medicines   #hyperlipidemia-10-year ASCVD risk over 12% S: Medication:none Lab Results  Component Value Date   CHOL 148 08/08/2021   HDL 43.30 08/08/2021   LDLCALC 74 08/08/2021   TRIG 154.0 (H) 08/08/2021   CHOLHDL 3 08/08/2021   A/P: open to statin if #s still slightly high as not interested in weight loss- but after discussing diabetes risk with statin wants to hold off  #Gout S: Medication: No uric acid lowering agent As needed: Sparing  diclofenac-perhaps once a year. Has not needed lately for this- sometimes uses for arthritis A/P:no recent flares- hold off on uric acid    # Hyperglycemia/insulin resistance/prediabetes-peak A1c 5.9 S:  Medication: none Lab Results  Component Value Date   HGBA1C 5.9 08/08/2021   HGBA1C 5.7 03/31/2015   A/P: hopefully stable- update a1c today. Continue current meds for now    # Insomnia S:Medication: Trazodone 50 mg A/P: stable- continue current medicines    # Prior infected sebaceous cyst at 08/08/2021 visit-had recommended general surgery for full removal as very large-he had declined at that time- reports better 2024   Recommended follow up: Return in about 6 months (around 09/09/2023) for followup or sooner if needed.Schedule b4 you leave. Future Appointments  Date Time Provider Department Center  03/15/2024  1:00 PM [facility] ANNUAL WELLNESS VISIT 1 [facility]   Lab/Order associations: fasting   ICD-10-CM   1. Preventative health care  Z00.00     2. Need for influenza vaccination  Z23 Flu Vaccine Trivalent High Dose (Fluad)    3. Essential hypertension  I10     4. Hyperlipidemia, unspecified hyperlipidemia type  E78.5 Comprehensive metabolic panel    CBC with Differential/Platelet    Lipid panel    5. Gout, unspecified cause, unspecified chronicity, unspecified site  M10.9     6. Hyperglycemia  R73.9 Hemoglobin A1c    7. Screening for diabetes mellitus  Z13.1 Hemoglobin A1c    8. Morbid obesity (HCC)  E66.01     9. Screening for prostate cancer  Z12.5 PSA, Medicare      Meds ordered this encounter  Medications   diclofenac (VOLTAREN) 75 MG EC tablet    Sig: TAKE 1 TABLET BY MOUTH TWICE A DAY AS NEEDED FOR MILD PAIN. NEED visit every 6 months for kidney monitoring if taking regularly    Dispense:  180 tablet    Refill:  0   amLODipine (NORVASC) 5 MG tablet    Sig: Take 1 tablet (5 mg total) by mouth daily.    Dispense:  90 tablet    Refill:  3   losartan  (COZAAR) 100 MG tablet    Sig: Take 1 tablet (100 mg total) by mouth daily.    Dispense:  90 tablet  Refill:  3   metoprolol succinate (TOPROL-XL) 50 MG 24 hr tablet    Sig: Take 1 tablet (50 mg total) by mouth daily. TAKE WITH OR IMMEDIATELY FOLLOWING A MEAL.    Dispense:  90 tablet    Refill:  3   traZODone (DESYREL) 50 MG tablet    Sig: TAKE 1 TABLET BY MOUTH EVERYDAY AT BEDTIME    Dispense:  90 tablet    Refill:  3    Return precautions advised.  Tana Conch, MD

## 2023-03-12 NOTE — Patient Instructions (Addendum)
Let Donald [name] know when you are ready to get your Pneumonia shot.  Would love for you to get dental check up if possible  Please stop by lab before you go If you have mychart- we will send your results within 3 business days of Donald [name] receiving them.  If you do not have mychart- we will call you about results within 5 business days of Donald [name] receiving them.  *please also note that you will see labs on mychart as soon as they post. I will later go in and write notes on them- will say "notes from Dr. Durene Cal"   Recommended follow up: Return in about 6 months (around 09/09/2023) for followup or sooner if needed.Schedule b4 you leave.

## 2023-09-09 ENCOUNTER — Ambulatory Visit: Payer: PPO | Admitting: Family Medicine

## 2023-09-09 ENCOUNTER — Encounter: Payer: Self-pay | Admitting: Family Medicine

## 2023-09-09 VITALS — BP 142/80 | HR 80 | Temp 98.7°F | Ht 75.0 in | Wt 347.4 lb

## 2023-09-09 DIAGNOSIS — E785 Hyperlipidemia, unspecified: Secondary | ICD-10-CM

## 2023-09-09 DIAGNOSIS — M1A00X0 Idiopathic chronic gout, unspecified site, without tophus (tophi): Secondary | ICD-10-CM | POA: Diagnosis not present

## 2023-09-09 DIAGNOSIS — Z6841 Body Mass Index (BMI) 40.0 and over, adult: Secondary | ICD-10-CM

## 2023-09-09 DIAGNOSIS — Z131 Encounter for screening for diabetes mellitus: Secondary | ICD-10-CM | POA: Diagnosis not present

## 2023-09-09 DIAGNOSIS — I10 Essential (primary) hypertension: Secondary | ICD-10-CM

## 2023-09-09 MED ORDER — DICLOFENAC SODIUM 75 MG PO TBEC: DELAYED_RELEASE_TABLET | ORAL | 0 refills | Status: DC

## 2023-09-09 MED ORDER — DOXYCYCLINE HYCLATE 100 MG PO TABS: 100.0000 mg | ORAL_TABLET | Freq: Two times a day (BID) | ORAL | 0 refills | Status: AC

## 2023-09-09 NOTE — Patient Instructions (Addendum)
 blood pressure slightly high today but has been ill - he wants to monitor at home after feels better from illness and update me with home readings a week after that  - on exam has sinus irritation with yellow discharge and swollen nasal turbinates and has had 2 weeks of sinus congestion at this point only mildly better today concerning for bacterial sinusitis with duration of symptoms- we opted to treat with doxycycline for 7 days to cover for bacterial sinusitis but could also provide some respiratory coverage including walking pneumonia. Could try prednisone if only partially improved for post viral or post bacterial cough- if that failed would do CXR   Recommended follow up: Return in about 6 months (around 03/11/2024) for physical or sooner if needed.Schedule b4 you leave.

## 2023-09-09 NOTE — Progress Notes (Signed)
 Phone [phone] In person visit   Subjective:   Donald [name] is a 68 y.o. year old very pleasant male patient who presents for/with See problem oriented charting Chief Complaint  Patient presents with   Medical Management of Chronic Issues   Hypertension   Cough    Pt has had cough and mucous x2 weeks but he is much better today.   Hand Pain    Pt c/o bilateral hand pain since December   Past Medical History-  Patient Active Problem List   Diagnosis Date Noted   Morbid obesity (HCC) 08/14/2015    Priority: High   Hyperlipidemia, unspecified 08/08/2021    Priority: Medium    Gout 05/11/2010    Priority: Medium    INSOMNIA-SLEEP DISORDER-UNSPEC 01/09/2010    Priority: Medium    Essential hypertension 12/14/2009    Priority: Medium    Infection due to Enterobacteriaceae     Priority: Low   S/P Achilles tendon repair 04/12/2015    Priority: Low   Achilles tendinitis of both lower extremities 06/20/2014    Priority: Low   Leg length difference, acquired 06/20/2014    Priority: Low   Hematochezia 04/07/2013    Priority: Low   Osteoarthritis of left knee 01/08/2013    Priority: Low   Allergic rhinitis, seasonal 11/04/2012    Priority: Low   Primary osteoarthritis of right hip 08/14/2015    Medications- reviewed and updated Current Outpatient Medications  Medication Sig Dispense Refill   acetaminophen (TYLENOL) 500 MG tablet Take 1,000 mg by mouth every 6 (six) hours as needed for moderate pain.     amLODipine (NORVASC) 5 MG tablet Take 1 tablet (5 mg total) by mouth daily. 90 tablet 3   doxycycline (VIBRA-TABS) 100 MG tablet Take 1 tablet (100 mg total) by mouth 2 (two) times daily for 7 days. 14 tablet 0   losartan (COZAAR) 100 MG tablet Take 1 tablet (100 mg total) by mouth daily. 90 tablet 3   metoprolol succinate (TOPROL-XL) 50 MG 24 hr tablet Take 1 tablet (50 mg total) by mouth daily. TAKE WITH OR IMMEDIATELY FOLLOWING A MEAL. 90 tablet 3   traZODone  (DESYREL) 50 MG tablet TAKE 1 TABLET BY MOUTH EVERYDAY AT BEDTIME 90 tablet 3   diclofenac (VOLTAREN) 75 MG EC tablet TAKE 1 TABLET BY MOUTH TWICE A DAY AS NEEDED FOR MILD PAIN. NEED visit every 6 months for kidney monitoring if taking regularly 90 tablet 0   No current facility-administered medications for this visit.     Objective:  BP (!) 142/80 Comment: no improvement on repeat  Pulse 80   Temp 98.7 F (37.1 C)   Ht 6\' 3"  (1.905 m)   Wt (!) 347 lb 6.4 oz (157.6 kg)   SpO2 95%   BMI 43.42 kg/m  Gen: NAD, resting comfortably No sinus illness.  Tympanic membrane normal bilaterally.  Nasal turbinates erythematous and edematous with yellow discharge noted as well as some white ischarge CV: RRR no murmurs rubs or gallops Lungs: CTAB no crackles, wheeze, rhonchi Abdomen: soft/nontender/nondistended/normal bowel sounds. No rebound or guarding.  Ext: 1+ edema Skin: warm, dry     Assessment and Plan   # Cough/congestion-patient reports 2 weeks of cough  -no home test as had COVID about 2 months ago he reports - didn't have fever. Some sore throat just at first - productive cough, runny nose- mainly white.  -mucinex helps -sleeping in recliner -starting to feel slightly better today but not substantially -  less running and cough - on exam has sinus irritation with yellow discharge and swollen nasal turbinates and has had 2 weeks of sinus congestion at this point only mildly better today concerning for bacterial sinusitis with duration of symptoms- we opted to treat with doxycycline for 7 days to cover for bacterial sinusitis but could also provide some respiratory coverage including walking pneumonia. Could try prednisone if only partially improved for post viral or post bacterial cough- if that failed would do CXR  -Advised to avoid prolonged sun exposure on doxycycline  # Bilateral hand pain-patient reports started around December- woke up with them hurting multiple days but finally  better in last few weeks. Fingers feel tight - tinel test positive- and reports pinky spared- likely carpal tunnel- discussed cock up wrist splint- he declines- not that bothersome- wants to monitor - consider sports medicine if worsens  #hypertension S: medication: amlodipine 5 mg, losartan 100 mg, metoprolol 50 mg extended release Home readings #s: has cuff but not checking BP Readings from Last 3 Encounters:  09/09/23 (!) 142/80  03/12/23 120/80  10/18/21 136/78  A/P: blood pressure slightly high today but has been ill - he wants to monitor at home after feels better from illness and update me with home readings a week after that - some swelling on amlodipine  #hyperlipidemia S: Medication:none Lab Results  Component Value Date   CHOL 147 03/12/2023   HDL 49.80 03/12/2023   LDLCALC 68 03/12/2023   TRIG 147.0 03/12/2023   CHOLHDL 3 03/12/2023   A/P: #s improved on last check- recheck next visit- continue to work healthy eating and regular exercise - has gained some weight so needs to reverse trend  #Gout S: Medication: No uric acid lowering agent and no recent flares As needed: Sparing diclofenac-perhaps once a year A/P:wants refill on diclofenac if has flare but thankfully doing well and hopefully won't need    # Hyperglycemia/insulin resistance/prediabetes-peak A1c 5.9 S:  Medication: none Lab Results  Component Value Date   HGBA1C 6.2 03/12/2023   HGBA1C 5.9 08/08/2021   HGBA1C 5.7 03/31/2015  A/P: discussed checking a1c today but with weight gain he wants to work on reversing before checking a1c at follow up   # Insomnia S:Medication: Trazodone 50 mg A/P: reasonably helpful- continue current medications    Recommended follow up: Return in about 6 months (around 03/11/2024) for physical or sooner if needed.Schedule b4 you leave. Future Appointments  Date Time Provider Department Center  03/15/2024  1:00 PM [facility] ANNUAL WELLNESS VISIT 1 [facility]  03/19/2024 11:00  AM Shelva Majestic, MD [facility]    Lab/Order associations:   ICD-10-CM   1. Essential hypertension  I10     2. Hyperlipidemia, unspecified hyperlipidemia type  E78.5     3. Idiopathic chronic gout without tophus, unspecified site  M1A.00X0     4. Morbid obesity (HCC)  E66.01     5. Screening for diabetes mellitus  Z13.1       Meds ordered this encounter  Medications   diclofenac (VOLTAREN) 75 MG EC tablet    Sig: TAKE 1 TABLET BY MOUTH TWICE A DAY AS NEEDED FOR MILD PAIN. NEED visit every 6 months for kidney monitoring if taking regularly    Dispense:  90 tablet    Refill:  0   doxycycline (VIBRA-TABS) 100 MG tablet    Sig: Take 1 tablet (100 mg total) by mouth 2 (two) times daily for 7 days.    Dispense:  14 tablet    Refill:  0    Return precautions advised.  Tana Conch, MD

## 2023-10-06 ENCOUNTER — Other Ambulatory Visit: Payer: Self-pay | Admitting: Family Medicine

## 2023-10-29 ENCOUNTER — Encounter: Payer: Self-pay | Admitting: Family Medicine

## 2023-11-27 ENCOUNTER — Telehealth: Payer: Self-pay

## 2023-11-27 NOTE — Telephone Encounter (Signed)
 Called and lm for pt tcb with bp readings OR can send via mychart.

## 2023-11-27 NOTE — Telephone Encounter (Signed)
-----   Message from Clarisa Crooked sent at 11/26/2023  1:34 PM EDT ----- Can you follow-up with him on this-I sent him a MyChart message and he did not respond ----- Message ----- From: Almira Jaeger, MD Sent: 10/29/2023  12:00 AM EDT To: Almira Jaeger, MD  Did he send me blood pressure readings ?

## 2024-03-03 ENCOUNTER — Other Ambulatory Visit: Payer: Self-pay | Admitting: Family Medicine

## 2024-03-13 ENCOUNTER — Other Ambulatory Visit: Payer: Self-pay | Admitting: Family Medicine

## 2024-03-15 ENCOUNTER — Ambulatory Visit (INDEPENDENT_AMBULATORY_CARE_PROVIDER_SITE_OTHER): Payer: PPO

## 2024-03-15 VITALS — Ht 75.0 in | Wt 347.0 lb

## 2024-03-15 DIAGNOSIS — Z0000 Encounter for general adult medical examination without abnormal findings: Secondary | ICD-10-CM

## 2024-03-15 NOTE — Patient Instructions (Signed)
 Donald [name],  Thank you for taking the time for your Medicare Wellness Visit. I appreciate your continued commitment to your health goals. Please review the care plan we discussed, and feel free to reach out if I can assist you further.  Medicare recommends these wellness visits once per year to help you and your care team stay ahead of potential health issues. These visits are designed to focus on prevention, allowing your provider to concentrate on managing your acute and chronic conditions during your regular appointments.  Please note that Annual Wellness Visits do not include a physical exam. Some assessments may be limited, especially if the visit was conducted virtually. If needed, we may recommend a separate in-person follow-up with your provider.  Ongoing Care Seeing your primary care provider every 3 to 6 months helps us  monitor your health and provide consistent, personalized care.   Referrals If a referral was made during today's visit and you haven't received any updates within two weeks, please contact the referred provider directly to check on the status.  Recommended Screenings:  Health Maintenance  Topic Date Due   Zoster (Shingles) Vaccine (1 of 2) Never done   Flu Shot  01/30/2024   Medicare Annual Wellness Visit  03/09/2024   Pneumococcal Vaccine for age over 35 (1 of 1 - PCV) 09/08/2024*   Colon Cancer Screening  10/19/2031   Hepatitis C Screening  Completed   HPV Vaccine  Aged Out   Meningitis B Vaccine  Aged Out   DTaP/Tdap/Td vaccine  Discontinued   COVID-19 Vaccine  Discontinued  *Topic was postponed. The date shown is not the original due date.       03/10/2023    2:06 PM  Advanced Directives  Does Patient Have a Medical Advance Directive? Yes  Type of Estate agent of [place];Living will  Copy of Healthcare Power of Attorney in Chart? No - copy requested   Advance Care Planning is important because it: Ensures you receive medical  care that aligns with your values, goals, and preferences. Provides guidance to your family and loved ones, reducing the emotional burden of decision-making during critical moments.  Vision: Annual vision screenings are recommended for early detection of glaucoma, cataracts, and diabetic retinopathy. These exams can also reveal signs of chronic conditions such as diabetes and high blood pressure.  Dental: Annual dental screenings help detect early signs of oral cancer, gum disease, and other conditions linked to overall health, including heart disease and diabetes.  Please see the attached documents for additional preventive care recommendations.

## 2024-03-15 NOTE — Progress Notes (Signed)
 Subjective:   Donald [name] is a 68 y.o. who presents for a Medicare Wellness preventive visit.  As a reminder, Annual Wellness Visits don't include a physical exam, and some assessments may be limited, especially if this visit is performed virtually. We may recommend an in-person follow-up visit with your provider if needed.  Visit Complete: Virtual I connected with  Lamar SHAUNNA Lunger on 03/15/24 by a audio enabled telemedicine application and verified that I am speaking with the correct person using two identifiers.  Patient Location: Home  Provider Location: Office/Clinic  I discussed the limitations of evaluation and management by telemedicine. The patient expressed understanding and agreed to proceed.  Vital Signs: Because this visit was a virtual/telehealth visit, some criteria may be missing or patient reported. Any vitals not documented were not able to be obtained and vitals that have been documented are patient reported.  VideoDeclined- This patient declined Librarian, academic. Therefore the visit was completed with audio only.  Persons Participating in Visit: Patient.  AWV Questionnaire: Yes: Patient Medicare AWV questionnaire was completed by the patient on 03/08/24; I have confirmed that all information answered by patient is correct and no changes since this date.  Cardiac Risk Factors include: advanced age (>15men, >66 women);dyslipidemia;hypertension;male gender;obesity (BMI >30kg/m2)     Objective:    Today's Vitals   03/15/24 1307  Weight: (!) 347 lb (157.4 kg)  Height: 6' 3 (1.905 m)   Body mass index is 43.37 kg/m.     03/15/2024    1:09 PM 03/10/2023    2:06 PM 05/13/2022    9:42 AM 07/23/2018   11:25 AM 07/21/2018    1:07 PM 07/14/2017    7:05 AM 08/16/2015   11:00 AM  Advanced Directives  Does Patient Have a Medical Advance Directive? Yes Yes No No  No  No  No   Type of Estate agent of [place];Living  will Healthcare Power of [place];Living will       Does patient want to make changes to medical advance directive? No - Patient declined        Copy of Healthcare Power of Attorney in Chart? Yes - validated most recent copy scanned in chart (See row information) No - copy requested       Would patient like information on creating a medical advance directive?   No - Patient declined No - Patient declined  No - Patient declined  No - Patient declined  No - patient declined information      Data saved with a previous flowsheet row definition    Current Medications (verified) Outpatient Encounter Medications as of 03/15/2024  Medication Sig   acetaminophen  (TYLENOL ) 500 MG tablet Take 1,000 mg by mouth every 6 (six) hours as needed for moderate pain.   amLODipine  (NORVASC ) 5 MG tablet Take 1 tablet (5 mg total) by mouth daily.   diclofenac  (VOLTAREN ) 75 MG EC tablet TAKE 1 TABLET BY MOUTH TWICE A DAY AS NEEDED FOR MILD PAIN. NEED VISIT EVERY 6 MONTHS FOR KIDNEY MONITORING IF TAKING REGULARLY   losartan  (COZAAR ) 100 MG tablet TAKE 1 TABLET BY MOUTH EVERY DAY   metoprolol  succinate (TOPROL -XL) 50 MG 24 hr tablet Take 1 tablet (50 mg total) by mouth daily. TAKE WITH OR IMMEDIATELY FOLLOWING A MEAL.   traZODone  (DESYREL ) 50 MG tablet TAKE 1 TABLET BY MOUTH EVERYDAY AT BEDTIME   No facility-administered encounter medications on file as of 03/15/2024.    Allergies (verified) Other,  Bee venom, and Tape   History: Past Medical History:  Diagnosis Date   Allergy    Arthritis    Bronchitis    HISTORY FEW MO AGO   Colitis    Colon polyps    Diverticulitis    Diverticulosis 04/07/2013   Gout    Hyperlipidemia    Hypertension    Past Surgical History:  Procedure Laterality Date   ACHILLES TENDON REPAIR  08/01/2014   X 2   COLONOSCOPY     COLONOSCOPY W/ POLYPECTOMY     INCISION AND DRAINAGE OF WOUND Left 04/12/2015   Procedure: DEBRIDEMENT OF ACHILLES TENDON LEFT, REMOVAL OF FIBERWIRE  SUTURE;  Surgeon: Norleen Gavel, MD;  Location: [facility];  Service: Orthopedics;  Laterality: Left;   JOINT REPLACEMENT     LUMBAR LAMINECTOMY/DECOMPRESSION MICRODISCECTOMY N/A 07/23/2018   Procedure: LUMBAR LAMINECTOMY/DECOMPRESSION MICRODISCECTOMY 3 LEVELS; Lumbar 2-3, Lumbar 3-4, Lumbar 4-5;  Surgeon: Beuford Anes, MD;  Location: [facility];  Service: Orthopedics;  Laterality: N/A;   MINOR APPLICATION OF WOUND VAC  04/12/2015   Procedure:  APPLICATION OF INCISIONAL WOUND VAC;  Surgeon: Norleen Gavel, MD;  Location: [facility];  Service: Orthopedics;;   SPINE SURGERY     TOTAL HIP ARTHROPLASTY Right 08/14/2015   Procedure: TOTAL HIP ARTHROPLASTY ANTERIOR APPROACH;  Surgeon: Norleen Gavel, MD;  [facility]: [facility];  Service: Orthopedics;  Laterality: Right;   TOTAL KNEE ARTHROPLASTY Left 01/08/2013   Procedure: TOTAL KNEE ARTHROPLASTY;  Surgeon: Norleen LITTIE Gavel, MD;  Location[facility];  Service: Orthopedics;  Laterality: Left;   Family History  Problem Relation Age of Onset   Emphysema Mother    COPD Mother    Lung cancer Father        quit smoking 40 years prior   Arthritis Brother    Cancer Brother        pancreatic cancer- 2.5 year struggle   Diabetes Brother    Colon polyps Neg Hx    Colon cancer Neg Hx    Esophageal cancer Neg Hx    Stomach cancer Neg Hx    Rectal cancer Neg Hx    Social History   Socioeconomic History   Marital status: Single    Spouse name: Not on file   Number of children: Not on file   Years of education: Not on file   Highest education level: 12th grade  Occupational History   Occupation: Event organiser: [employer]  Tobacco Use   Smoking status: Never   Smokeless tobacco: Never  Vaping Use   Vaping status: Never Used  Substance and Sexual Activity   Alcohol use: Yes    Alcohol/week: 6.0 standard drinks of alcohol    Types: 6 Cans of beer per week    Comment: light   Drug use: No   Sexual activity: Not Currently  Other Topics Concern    Not on file  Social History Narrative   Lives alone. No pets. No kids.       Prior security at [employer]   Hasnt worked since knee surgery, ruptured achilles that later infected, right hip and low back surgery      Hobbies: enjoys watching the METS   Social Drivers of Health   Financial Resource Strain: Low Risk  (03/08/2024)   Overall Financial Resource Strain (CARDIA)    Difficulty of Paying Living Expenses: Not very hard  Food Insecurity: No Food Insecurity (03/08/2024)   Hunger Vital Sign  Worried About Programme researcher, broadcasting/film/video in the Last Year: Never true    Ran Out of Food in the Last Year: Never true  Transportation Needs: No Transportation Needs (03/08/2024)   PRAPARE - Administrator, Civil Service (Medical): No    Lack of Transportation (Non-Medical): No  Physical Activity: Inactive (03/08/2024)   Exercise Vital Sign    Days of Exercise per Week: 0 days    Minutes of Exercise per Session: 0 min  Stress: No Stress Concern Present (03/08/2024)   [employer] of Occupational Health - Occupational Stress Questionnaire    Feeling of Stress: Not at all  Social Connections: Socially Isolated (03/08/2024)   Social Connection and Isolation Panel    Frequency of Communication with Friends and Family: Three times a week    Frequency of Social Gatherings with Friends and Family: Three times a week    Attends Religious Services: Never    Active Member of Clubs or Organizations: No    Attends Engineer, structural: Not on file    Marital Status: Never married    Tobacco Counseling Counseling given: Not Answered    Clinical Intake:  Pre-visit preparation completed: Yes  Pain : No/denies pain     BMI - recorded: 43.37 Nutritional Status: BMI > 30  Obese Nutritional Risks: None Diabetes: No  Lab Results  Component Value Date   HGBA1C 6.2 03/12/2023   HGBA1C 5.9 08/08/2021   HGBA1C 5.7 03/31/2015     How often do you need to have someone  help you when you read instructions, pamphlets, or other written materials from your doctor or pharmacy?: 1 - Never  Interpreter Needed?: No  Information entered by :: Ellouise Haws, LPN   Activities of Daily Living     03/08/2024   12:00 PM  In your present state of health, do you have any difficulty performing the following activities:  Hearing? 0  Vision? 0  Difficulty concentrating or making decisions? 0  Walking or climbing stairs? 0  Dressing or bathing? 0  Doing errands, shopping? 0  Preparing Food and eating ? N  Using the Toilet? N  In the past six months, have you accidently leaked urine? N  Do you have problems with loss of bowel control? N  Managing your Medications? N  Managing your Finances? N  Housekeeping or managing your Housekeeping? N    Patient Care Team: Katrinka Garnette KIDD, MD as PCP - General (Family Medicine)  I have updated your Care Teams any recent Medical Services you may have received from other providers in the past year.     Assessment:   This is a routine wellness examination for Donald [name].  Hearing/Vision screen Hearing Screening - Comments:: Pt denies any hearing issues  Vision Screening - Comments:: Wears rx glasses - up to date with routine eye exams with Dr Abigail    Goals Addressed             This Visit's Progress    Patient Stated       Staying healthy as I can        Depression Screen     03/15/2024    1:10 PM 03/12/2023    9:17 AM 03/10/2023    2:06 PM 05/13/2022    9:40 AM 08/08/2021    9:57 AM 02/05/2021   11:06 AM 10/29/2018   12:10 PM  PHQ 2/9 Scores  PHQ - 2 Score 0 0 0 0 0 0 0  PHQ-  9 Score  0         Fall Risk     03/08/2024   12:00 PM 03/12/2023    9:17 AM 03/08/2023   10:11 AM 05/13/2022    9:43 AM 05/12/2022   11:46 AM  Fall Risk   Falls in the past year? 0 0 0 1 1  Number falls in past yr:  0  1 0  Injury with Fall?  0  0 0  Risk for fall due to : Impaired mobility No Fall Risks Impaired vision;Impaired  balance/gait Impaired balance/gait;Impaired mobility;Impaired vision   Follow up Falls prevention discussed Falls evaluation completed Falls prevention discussed Falls prevention discussed       Data saved with a previous flowsheet row definition    MEDICARE RISK AT HOME:  Medicare Risk at Home Any stairs in or around the home?: (Patient-Rptd) Yes If so, are there any without handrails?: (Patient-Rptd) Yes Home free of loose throw rugs in walkways, pet beds, electrical cords, etc?: (Patient-Rptd) Yes Adequate lighting in your home to reduce risk of falls?: (Patient-Rptd) Yes Life alert?: (Patient-Rptd) No Use of a cane, walker or w/c?: (Patient-Rptd) No Grab bars in the bathroom?: (Patient-Rptd) No Shower chair or bench in shower?: (Patient-Rptd) No Elevated toilet seat or a handicapped toilet?: (Patient-Rptd) No  TIMED UP AND GO:  Was the test performed?  No  Cognitive Function: 6CIT completed        03/15/2024    1:11 PM 03/10/2023    2:08 PM 05/13/2022    9:44 AM  6CIT Screen  What Year? 0 points 0 points 0 points  What month? 0 points 0 points 0 points  What time? 0 points 0 points 0 points  Count back from 20 0 points 0 points 0 points  Months in reverse 0 points 0 points 0 points  Repeat phrase 0 points 0 points 0 points  Total Score 0 points 0 points 0 points    Immunizations Immunization History  Administered Date(s) Administered   Fluad Quad(high Dose 65+) 03/21/2022   Fluad Trivalent(High Dose 65+) 03/12/2023   Influenza Inj Mdck Quad Pf 03/27/2018, 02/28/2021   Influenza Split 04/05/2011   Influenza Whole 04/29/2007, 04/05/2008, 03/23/2009, 04/02/2010   Influenza,inj,Quad PF,6+ Mos 04/09/2013, 03/31/2015, 04/08/2017, 03/24/2019, 03/22/2020   Influenza-Unspecified 04/14/2014, 03/20/2016   PFIZER(Purple Top)SARS-COV-2 Vaccination 09/13/2019, 10/04/2019, 04/12/2020   Td 04/02/2010    Screening Tests Health Maintenance  Topic Date Due   Zoster Vaccines-  Shingrix (1 of 2) Never done   Influenza Vaccine  01/30/2024   Pneumococcal Vaccine: 50+ Years (1 of 1 - PCV) 09/08/2024 (Originally 11/20/2006)   Medicare Annual Wellness (AWV)  03/15/2025   Colonoscopy  10/19/2031   Hepatitis C Screening  Completed   HPV VACCINES  Aged Out   Meningococcal B Vaccine  Aged Out   DTaP/Tdap/Td  Discontinued   COVID-19 Vaccine  Discontinued    Health Maintenance Items Addressed: See Nurse Notes at the end of this note  Additional Screening:  Vision Screening: Recommended annual ophthalmology exams for early detection of glaucoma and other disorders of the eye. Is the patient up to date with their annual eye exam?  Yes  Who is the provider or what is the name of the office in which the patient attends annual eye exams? Dr Abigail   Dental Screening: Recommended annual dental exams for proper oral hygiene  Community Resource Referral / Chronic Care Management: CRR required this visit?  No   CCM required this  visit?  No   Plan:    I have personally reviewed and noted the following in the patient's chart:   Medical and social history Use of alcohol, tobacco or illicit drugs  Current medications and supplements including opioid prescriptions. Patient is not currently taking opioid prescriptions. Functional ability and status Nutritional status Physical activity Advanced directives List of other physicians Hospitalizations, surgeries, and ER visits in previous 12 months Vitals Screenings to include cognitive, depression, and falls Referrals and appointments  In addition, I have reviewed and discussed with patient certain preventive protocols, quality metrics, and best practice recommendations. A written personalized care plan for preventive services as well as general preventive health recommendations were provided to patient.   Ellouise VEAR Haws, LPN   [id number]   After Visit Summary: (MyChart) Due to this being a telephonic visit, the after  visit summary with patients personalized plan was offered to patient via MyChart   Notes: PCP Follow Up Recommendations: request flu shot when come in for appt 03/19/24

## 2024-03-19 ENCOUNTER — Ambulatory Visit (INDEPENDENT_AMBULATORY_CARE_PROVIDER_SITE_OTHER): Admitting: Family Medicine

## 2024-03-19 ENCOUNTER — Ambulatory Visit: Payer: Self-pay | Admitting: Family Medicine

## 2024-03-19 ENCOUNTER — Encounter: Payer: Self-pay | Admitting: Family Medicine

## 2024-03-19 VITALS — BP 132/80 | HR 73 | Temp 97.5°F | Ht 75.0 in | Wt 343.8 lb

## 2024-03-19 DIAGNOSIS — Z0000 Encounter for general adult medical examination without abnormal findings: Secondary | ICD-10-CM | POA: Diagnosis not present

## 2024-03-19 DIAGNOSIS — Z23 Encounter for immunization: Secondary | ICD-10-CM

## 2024-03-19 DIAGNOSIS — Z131 Encounter for screening for diabetes mellitus: Secondary | ICD-10-CM | POA: Diagnosis not present

## 2024-03-19 DIAGNOSIS — R739 Hyperglycemia, unspecified: Secondary | ICD-10-CM

## 2024-03-19 DIAGNOSIS — E785 Hyperlipidemia, unspecified: Secondary | ICD-10-CM

## 2024-03-19 DIAGNOSIS — I10 Essential (primary) hypertension: Secondary | ICD-10-CM

## 2024-03-19 DIAGNOSIS — Z125 Encounter for screening for malignant neoplasm of prostate: Secondary | ICD-10-CM

## 2024-03-19 LAB — CBC WITH DIFFERENTIAL/PLATELET
Basophils Absolute: 0.1 K/uL (ref 0.0–0.1)
Basophils Relative: 0.8 % (ref 0.0–3.0)
Eosinophils Absolute: 0.1 K/uL (ref 0.0–0.7)
Eosinophils Relative: 1.8 % (ref 0.0–5.0)
HCT: 42.4 % (ref 39.0–52.0)
Hemoglobin: 14.1 g/dL (ref 13.0–17.0)
Lymphocytes Relative: 25.8 % (ref 12.0–46.0)
Lymphs Abs: 2 K/uL (ref 0.7–4.0)
MCHC: 33.2 g/dL (ref 30.0–36.0)
MCV: 89.5 fl (ref 78.0–100.0)
Monocytes Absolute: 0.6 K/uL (ref 0.1–1.0)
Monocytes Relative: 8 % (ref 3.0–12.0)
Neutro Abs: 4.8 K/uL (ref 1.4–7.7)
Neutrophils Relative %: 63.6 % (ref 43.0–77.0)
Platelets: 322 K/uL (ref 150.0–400.0)
RBC: 4.74 Mil/uL (ref 4.22–5.81)
RDW: 13.4 % (ref 11.5–15.5)
WBC: 7.6 K/uL (ref 4.0–10.5)

## 2024-03-19 LAB — LIPID PANEL
Cholesterol: 148 mg/dL (ref 0–200)
HDL: 40.2 mg/dL (ref >39.00)
LDL Cholesterol: 78 mg/dL (ref 0–99)
NonHDL: 107.61
Total CHOL/HDL Ratio: 4
Triglycerides: 148 mg/dL (ref 0.0–149.0)
VLDL: 29.6 mg/dL (ref 0.0–40.0)

## 2024-03-19 LAB — COMPREHENSIVE METABOLIC PANEL WITH GFR
ALT: 45 U/L (ref 0–53)
AST: 37 U/L (ref 0–37)
Albumin: 4.7 g/dL (ref 3.5–5.2)
Alkaline Phosphatase: 45 U/L (ref 39–117)
BUN: 11 mg/dL (ref 6–23)
CO2: 25 meq/L (ref 19–32)
Calcium: 9.4 mg/dL (ref 8.4–10.5)
Chloride: 102 meq/L (ref 96–112)
Creatinine, Ser: 0.94 mg/dL (ref 0.40–1.50)
GFR: 83.99 mL/min (ref >60.00)
Glucose, Bld: 100 mg/dL — ABNORMAL HIGH (ref 70–99)
Potassium: 4.7 meq/L (ref 3.5–5.1)
Sodium: 136 meq/L (ref 135–145)
Total Bilirubin: 0.6 mg/dL (ref 0.2–1.2)
Total Protein: 7.3 g/dL (ref 6.0–8.3)

## 2024-03-19 LAB — HEMOGLOBIN A1C: Hgb A1c MFr Bld: 6.6 % — ABNORMAL HIGH (ref 4.6–6.5)

## 2024-03-19 LAB — PSA, MEDICARE: PSA: 0.5 ng/mL (ref 0.10–4.00)

## 2024-03-19 MED ORDER — LOSARTAN POTASSIUM 100 MG PO TABS: 100.0000 mg | ORAL_TABLET | Freq: Every day | ORAL | 3 refills | Status: AC

## 2024-03-19 MED ORDER — AMLODIPINE BESYLATE 5 MG PO TABS: 5.0000 mg | ORAL_TABLET | Freq: Every day | ORAL | 3 refills | Status: AC

## 2024-03-19 MED ORDER — METOPROLOL SUCCINATE ER 50 MG PO TB24: 50.0000 mg | ORAL_TABLET | Freq: Every day | ORAL | 3 refills | Status: AC

## 2024-03-19 MED ORDER — TRAZODONE HCL 50 MG PO TABS: ORAL_TABLET | ORAL | 3 refills | Status: AC

## 2024-03-19 NOTE — Progress Notes (Signed)
 Phone: [phone]   Subjective:  Patient presents today for their annual physical. Chief complaint-noted.   See problem oriented charting- ROS- full  review of systems was completed and negative  except for topics noted under acute/chronic concerns  The following were reviewed and entered/updated in epic: Past Medical History:  Diagnosis Date   Allergy    Arthritis    Bronchitis    HISTORY FEW MO AGO   Colitis    Colon polyps    Diverticulitis    Diverticulosis 04/07/2013   Gout    Hyperlipidemia    Hypertension    Patient Active Problem List   Diagnosis Date Noted   Morbid obesity (HCC) 08/14/2015    Priority: High   Hyperlipidemia, unspecified 08/08/2021    Priority: Medium    Gout 05/11/2010    Priority: Medium    INSOMNIA-SLEEP DISORDER-UNSPEC 01/09/2010    Priority: Medium    Essential hypertension 12/14/2009    Priority: Medium    Infection due to Enterobacteriaceae     Priority: Low   S/P Achilles tendon repair 04/12/2015    Priority: Low   Achilles tendinitis of both lower extremities 06/20/2014    Priority: Low   Leg length difference, acquired 06/20/2014    Priority: Low   Hematochezia 04/07/2013    Priority: Low   Osteoarthritis of left knee 01/08/2013    Priority: Low   Allergic rhinitis, seasonal 11/04/2012    Priority: Low   Primary osteoarthritis of right hip 08/14/2015   Past Surgical History:  Procedure Laterality Date   ACHILLES TENDON REPAIR  08/01/2014   X 2   COLONOSCOPY     COLONOSCOPY W/ POLYPECTOMY     INCISION AND DRAINAGE OF WOUND Left 04/12/2015   Procedure: DEBRIDEMENT OF ACHILLES TENDON LEFT, REMOVAL OF FIBERWIRE SUTURE;  Surgeon: Norleen Gavel, MD;  [facility];  Service: Orthopedics;  Laterality: Left;   JOINT REPLACEMENT     LUMBAR LAMINECTOMY/DECOMPRESSION MICRODISCECTOMY N/A 07/23/2018   Procedure: LUMBAR LAMINECTOMY/DECOMPRESSION MICRODISCECTOMY 3 LEVELS; Lumbar 2-3, Lumbar 3-4, Lumbar 4-5;  Surgeon: Beuford Anes, MD;  Location: [facility];  Service: Orthopedics;  Laterality: N/A;   MINOR APPLICATION OF WOUND VAC  04/12/2015   Procedure:  APPLICATION OF INCISIONAL WOUND VAC;  Surgeon: Norleen Gavel, MD;  Location: [facility];  Service: Orthopedics;;   SPINE SURGERY     TOTAL HIP ARTHROPLASTY Right 08/14/2015   Procedure: TOTAL HIP ARTHROPLASTY ANTERIOR APPROACH;  Surgeon: Norleen Gavel, MD;  [facility]: [facility];  Service: Orthopedics;  Laterality: Right;   TOTAL KNEE ARTHROPLASTY Left 01/08/2013   Procedure: TOTAL KNEE ARTHROPLASTY;  Surgeon: Norleen LITTIE Gavel, MD;  [facility]: [facility];  Service: Orthopedics;  Laterality: Left;    Family History  Problem Relation Age of Onset   Emphysema Mother    COPD Mother    Lung cancer Father        quit smoking 40 years prior   Arthritis Brother    Cancer Brother        pancreatic cancer- 2.5 year struggle   Diabetes Brother    Colon polyps Neg Hx    Colon cancer Neg Hx    Esophageal cancer Neg Hx    Stomach cancer Neg Hx    Rectal cancer Neg Hx     Medications- reviewed and updated Current Outpatient Medications  Medication Sig Dispense Refill   acetaminophen  (TYLENOL ) 500 MG tablet Take 1,000 mg by mouth every 6 (six) hours as needed for moderate pain.  amLODipine  (NORVASC ) 5 MG tablet Take 1 tablet (5 mg total) by mouth daily. 90 tablet 3   diclofenac  (VOLTAREN ) 75 MG EC tablet TAKE 1 TABLET BY MOUTH TWICE A DAY AS NEEDED FOR MILD PAIN. NEED VISIT EVERY 6 MONTHS FOR KIDNEY MONITORING IF TAKING REGULARLY 60 tablet 1   losartan  (COZAAR ) 100 MG tablet TAKE 1 TABLET BY MOUTH EVERY DAY 90 tablet 3   metoprolol  succinate (TOPROL -XL) 50 MG 24 hr tablet Take 1 tablet (50 mg total) by mouth daily. TAKE WITH OR IMMEDIATELY FOLLOWING A MEAL. 90 tablet 3   traZODone  (DESYREL ) 50 MG tablet TAKE 1 TABLET BY MOUTH EVERYDAY AT BEDTIME 90 tablet 3   No current facility-administered medications for this visit.    Allergies-reviewed and updated Allergies  Allergen Reactions    Other Other (See Comments)    Kevlar stitching, infection    Bee Venom     UNSPECIFIED REACTION    Tape Rash    Steri strips     Social History   Social History Narrative   Lives alone. No pets. No kids.       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 Oropharynx normal Neck: no thyromegaly CV: RRR no murmurs rubs or gallops Lungs: CTAB no crackles, wheeze, rhonchi Abdomen: soft/nontender/nondistended/normal bowel sounds. No rebound or guarding.  Ext: 1+ edema Skin: warm, dry Neuro: grossly normal, moves all extremities, PERRLA   Assessment and Plan  69 y.o. male presenting for annual physical.  Health Maintenance counseling: 1. Anticipatory guidance: Patient counseled regarding regular dental exams - advised q6 months- he declines, eye exams -yearly,  avoiding smoking and second hand smoke , limiting alcohol to 2 beverages per day - 6-10 per week, no illicit drugs.   2. Risk factor reduction:  Advised patient of need for regular exercise and diet rich and fruits and vegetables to reduce risk of heart attack and stroke.  Exercise- limited by aches and pains- declines water  walking in past.  Diet/weight management-weight up 2 lbs in last year, but down 4 from last visit- wants to keep working on this Wt Readings from Last 3 Encounters:  03/19/24 (!) 343 lb 12.8 oz (155.9 kg)  03/15/24 (!) 347 lb (157.4 kg)  09/09/23 (!) 347 lb 6.4 oz (157.6 kg)   3. Immunizations/screenings/ancillary studies- shingrix opts out, Prevnar 20 opts  out, did flu shot today, Tetanus, Diphtheria, and Pertussis (Tdap) due Immunization History  Administered Date(s) Administered   Fluad Quad(high Dose 65+) 03/21/2022   Fluad Trivalent(High Dose 65+) 03/12/2023   INFLUENZA, HIGH DOSE SEASONAL PF 03/19/2024   Influenza Inj Mdck Quad Pf 03/27/2018, 02/28/2021   Influenza Split 04/05/2011   Influenza Whole 04/29/2007, 04/05/2008, 03/23/2009, 04/02/2010   Influenza,inj,Quad PF,6+ Mos 04/09/2013, 03/31/2015, 04/08/2017, 03/24/2019, 03/22/2020   Influenza-Unspecified 04/14/2014, 03/20/2016   PFIZER(Purple Top)SARS-COV-2 Vaccination 09/13/2019, 10/04/2019, 04/12/2020   Td 04/02/2010   4. Prostate cancer screening- low risk prior trend- update psa today  Lab Results  Component Value Date   PSA 0.40 03/12/2023   PSA 0.34 08/08/2021   PSA 0.53 03/26/2010   5. Colon cancer screening - 10/18/21 with 10 year repeat 6. Skin cancer screening- no dermatology, advised regular sunscreen use. Denies worrisome, changing, or new skin lesions.  7. Smoking associated screening (lung cancer screening, AAA screen 65-75, UA)- never smoker 8. STD screening - not dating and not sexually active  Status of chronic or acute concerns   #hypertension S: medication: amlodipine  5 mg, losartan  100 mg, metoprolol  50 mg extended release BP Readings from Last 3 Encounters:  03/19/24 132/80  09/09/23 (!) 142/80  03/12/23 120/80  A/P: well controlled continue current medications   #hyperlipidemia-10-year ASCVD risk over 12% S: Medication:none The 10-year ASCVD risk score (Arnett DK, et al., 2019) is: 14.9%  Lab Results  Component Value Date   CHOL 147 03/12/2023   HDL 49.80 03/12/2023   LDLCALC 68 03/12/2023   TRIG 147.0 03/12/2023   CHOLHDL 3 03/12/2023  A/P: 10 year risk elevated mainly due to age but his LDL was actually at goal under 79- if above this we could consider CT calcium scoring- he wants to hold off though  #Gout S: Medication: No uric acid  lowering agent As needed: Sparing diclofenac -perhaps once a year- likes to keep on hand A/P:doing well lately- continue to monitor . With no flares hold off on checking uric acid    # Hyperglycemia/insulin resistance/prediabetes-peak A1c 5.9 S:  Medication: none  Lab Results  Component Value Date   HGBA1C 6.2 03/12/2023   HGBA1C 5.9 08/08/2021   HGBA1C 5.7 03/31/2015   A/P: hopefully stable- update a1c today. Continue without meds for now    # Insomnia S:Medication: Trazodone  50 mg A/P: reasonable control- continue current medications    # Prior infected sebaceous cyst at 08/08/2021 visit-had recommended general surgery for full removal as very large- he opts out still   #mild wrist issues- possible carpal tunnel doesn't want to use night braces.   #morbid obesity- not interested in injections but would be open to pills if come out  Recommended follow up: Return in about 6 months (around 09/16/2024) for followup or sooner if needed.Schedule b4 you leave. Future Appointments  Date Time Provider Department Center  03/21/2025  2:20 PM [facility] ANNUAL WELLNESS VISIT 1 [facility] [place]   Lab/Order associations: fasting   ICD-10-CM   1. Preventative health care  Z00.00     2. Need for influenza vaccination  Z23 Flu vaccine HIGH DOSE PF(Fluzone Trivalent)    3. Hyperlipidemia, unspecified hyperlipidemia type  E78.5     4. Screening for prostate cancer  Z12.5     5. Essential hypertension  I10     6. Screening for diabetes mellitus  Z13.1     7. Hyperglycemia  R73.9      No orders of the defined types were placed in this encounter.  Return precautions advised.  Garnette Lukes, MD

## 2024-03-19 NOTE — Patient Instructions (Addendum)
 Please stop by lab before you go If you have mychart- we will send your results within 3 business days of us  receiving them.  If you do not have mychart- we will call you about results within 5 business days of us  receiving them.  *please also note that you will see labs on mychart as soon as they post. I will later go in and write notes on them- will say notes from Dr. Katrinka Quant, Diphtheria, and Pertussis (Tdap) recommended at pharmacy  Thanks for doing flu shot  Recommended follow up: Return in about 6 months (around 09/16/2024) for followup or sooner if needed.Schedule b4 you leave.

## 2025-03-21 ENCOUNTER — Ambulatory Visit

## 2025-03-21 ENCOUNTER — Encounter: Admitting: Family Medicine
